# Patient Record
Sex: Female | Born: 1935 | Race: White | Hispanic: No | Smoking: Never smoker
Health system: Southern US, Community
[De-identification: ages and names within clinical notes are randomized; demographics above are authoritative.]

## PROBLEM LIST (undated history)

## (undated) DIAGNOSIS — I639 Cerebral infarction, unspecified: Secondary | ICD-10-CM

## (undated) DIAGNOSIS — K635 Polyp of colon: Secondary | ICD-10-CM

## (undated) DIAGNOSIS — K649 Unspecified hemorrhoids: Secondary | ICD-10-CM

## (undated) DIAGNOSIS — I272 Pulmonary hypertension, unspecified: Secondary | ICD-10-CM

## (undated) DIAGNOSIS — C801 Malignant (primary) neoplasm, unspecified: Secondary | ICD-10-CM

## (undated) DIAGNOSIS — I2510 Atherosclerotic heart disease of native coronary artery without angina pectoris: Secondary | ICD-10-CM

## (undated) DIAGNOSIS — L900 Lichen sclerosus et atrophicus: Secondary | ICD-10-CM

## (undated) DIAGNOSIS — I10 Essential (primary) hypertension: Secondary | ICD-10-CM

## (undated) DIAGNOSIS — R600 Localized edema: Secondary | ICD-10-CM

## (undated) DIAGNOSIS — I4819 Other persistent atrial fibrillation: Secondary | ICD-10-CM

## (undated) DIAGNOSIS — M1990 Unspecified osteoarthritis, unspecified site: Secondary | ICD-10-CM

## (undated) HISTORY — DX: Other persistent atrial fibrillation: I48.19

## (undated) HISTORY — DX: Malignant (primary) neoplasm, unspecified: C80.1

## (undated) HISTORY — DX: Cerebral infarction, unspecified: I63.9

## (undated) HISTORY — DX: Hemochromatosis, unspecified: E83.119

## (undated) HISTORY — DX: Pulmonary hypertension, unspecified: I27.20

## (undated) HISTORY — DX: Unspecified hemorrhoids: K64.9

## (undated) HISTORY — PX: CATARACT EXTRACTION, BILATERAL: SHX1313

## (undated) HISTORY — PX: BREAST LUMPECTOMY: SHX2

## (undated) HISTORY — DX: Essential (primary) hypertension: I10

## (undated) HISTORY — DX: Polyp of colon: K63.5

## (undated) HISTORY — DX: Hereditary hemochromatosis: E83.110

## (undated) HISTORY — DX: Atherosclerotic heart disease of native coronary artery without angina pectoris: I25.10

## (undated) HISTORY — DX: Unspecified osteoarthritis, unspecified site: M19.90

## (undated) HISTORY — DX: Lichen sclerosus et atrophicus: L90.0

## (undated) HISTORY — DX: Localized edema: R60.0

---

## 1998-11-08 ENCOUNTER — Other Ambulatory Visit: Admission: RE | Admit: 1998-11-08 | Discharge: 1998-11-08 | Payer: Self-pay | Admitting: Obstetrics and Gynecology

## 1999-05-13 ENCOUNTER — Other Ambulatory Visit: Admission: RE | Admit: 1999-05-13 | Discharge: 1999-05-13 | Payer: Self-pay | Admitting: Gynecology

## 2000-05-03 ENCOUNTER — Other Ambulatory Visit: Admission: RE | Admit: 2000-05-03 | Discharge: 2000-05-03 | Payer: Self-pay | Admitting: Gynecology

## 2001-07-12 ENCOUNTER — Other Ambulatory Visit: Admission: RE | Admit: 2001-07-12 | Discharge: 2001-07-12 | Payer: Self-pay | Admitting: Gynecology

## 2002-03-11 ENCOUNTER — Other Ambulatory Visit: Admission: RE | Admit: 2002-03-11 | Discharge: 2002-03-11 | Payer: Self-pay | Admitting: Radiology

## 2002-03-31 ENCOUNTER — Encounter: Admission: RE | Admit: 2002-03-31 | Discharge: 2002-03-31 | Payer: Self-pay | Admitting: General Surgery

## 2002-03-31 ENCOUNTER — Encounter: Payer: Self-pay | Admitting: General Surgery

## 2002-04-02 ENCOUNTER — Encounter (INDEPENDENT_AMBULATORY_CARE_PROVIDER_SITE_OTHER): Payer: Self-pay | Admitting: *Deleted

## 2002-04-02 ENCOUNTER — Ambulatory Visit (HOSPITAL_BASED_OUTPATIENT_CLINIC_OR_DEPARTMENT_OTHER): Admission: RE | Admit: 2002-04-02 | Discharge: 2002-04-02 | Payer: Self-pay | Admitting: General Surgery

## 2002-04-02 ENCOUNTER — Encounter: Payer: Self-pay | Admitting: General Surgery

## 2002-04-17 ENCOUNTER — Ambulatory Visit: Admission: RE | Admit: 2002-04-17 | Discharge: 2002-06-27 | Payer: Self-pay | Admitting: Radiation Oncology

## 2002-07-28 ENCOUNTER — Ambulatory Visit: Admission: RE | Admit: 2002-07-28 | Discharge: 2002-07-28 | Payer: Self-pay | Admitting: Radiation Oncology

## 2002-09-01 ENCOUNTER — Other Ambulatory Visit: Admission: RE | Admit: 2002-09-01 | Discharge: 2002-09-01 | Payer: Self-pay | Admitting: Gynecology

## 2003-07-11 HISTORY — PX: DILATION AND CURETTAGE OF UTERUS: SHX78

## 2003-08-11 HISTORY — PX: HYSTEROSCOPY: SHX211

## 2003-08-14 ENCOUNTER — Ambulatory Visit (HOSPITAL_COMMUNITY): Admission: RE | Admit: 2003-08-14 | Discharge: 2003-08-14 | Payer: Self-pay | Admitting: Gastroenterology

## 2003-08-14 ENCOUNTER — Encounter (INDEPENDENT_AMBULATORY_CARE_PROVIDER_SITE_OTHER): Payer: Self-pay | Admitting: *Deleted

## 2003-09-02 ENCOUNTER — Encounter: Admission: RE | Admit: 2003-09-02 | Discharge: 2003-09-02 | Payer: Self-pay | Admitting: Gynecology

## 2003-09-07 ENCOUNTER — Encounter (INDEPENDENT_AMBULATORY_CARE_PROVIDER_SITE_OTHER): Payer: Self-pay | Admitting: Specialist

## 2003-09-07 ENCOUNTER — Ambulatory Visit (HOSPITAL_BASED_OUTPATIENT_CLINIC_OR_DEPARTMENT_OTHER): Admission: RE | Admit: 2003-09-07 | Discharge: 2003-09-07 | Payer: Self-pay | Admitting: Gynecology

## 2003-09-07 ENCOUNTER — Ambulatory Visit (HOSPITAL_COMMUNITY): Admission: RE | Admit: 2003-09-07 | Discharge: 2003-09-07 | Payer: Self-pay | Admitting: Gynecology

## 2004-04-25 ENCOUNTER — Encounter: Admission: RE | Admit: 2004-04-25 | Discharge: 2004-04-25 | Payer: Self-pay | Admitting: Family Medicine

## 2004-05-21 ENCOUNTER — Ambulatory Visit: Payer: Self-pay | Admitting: Oncology

## 2004-07-10 HISTORY — PX: TOTAL KNEE ARTHROPLASTY: SHX125

## 2004-09-29 ENCOUNTER — Other Ambulatory Visit: Admission: RE | Admit: 2004-09-29 | Discharge: 2004-09-29 | Payer: Self-pay | Admitting: Gynecology

## 2004-10-28 ENCOUNTER — Ambulatory Visit: Payer: Self-pay | Admitting: Oncology

## 2004-11-28 ENCOUNTER — Ambulatory Visit: Payer: Self-pay | Admitting: Oncology

## 2005-03-06 ENCOUNTER — Inpatient Hospital Stay (HOSPITAL_COMMUNITY): Admission: RE | Admit: 2005-03-06 | Discharge: 2005-03-10 | Payer: Self-pay | Admitting: Orthopedic Surgery

## 2005-04-03 ENCOUNTER — Ambulatory Visit: Payer: Self-pay | Admitting: Oncology

## 2005-07-21 ENCOUNTER — Ambulatory Visit: Payer: Self-pay | Admitting: Oncology

## 2005-09-14 ENCOUNTER — Ambulatory Visit: Payer: Self-pay | Admitting: Oncology

## 2005-09-19 ENCOUNTER — Inpatient Hospital Stay (HOSPITAL_COMMUNITY): Admission: RE | Admit: 2005-09-19 | Discharge: 2005-09-23 | Payer: Self-pay | Admitting: Orthopedic Surgery

## 2005-11-09 ENCOUNTER — Ambulatory Visit: Payer: Self-pay | Admitting: Oncology

## 2005-12-08 LAB — CBC WITH DIFFERENTIAL (CANCER CENTER ONLY)
BASO%: 0.7 % (ref 0.0–2.0)
Eosinophils Absolute: 0.4 10*3/uL (ref 0.0–0.5)
MCH: 31.7 pg (ref 26.0–34.0)
MONO%: 10.1 % (ref 0.0–13.0)
NEUT#: 3.8 10*3/uL (ref 1.5–6.5)
Platelets: 262 10*3/uL (ref 145–400)
RBC: 3.69 10*6/uL — ABNORMAL LOW (ref 3.70–5.32)
RDW: 10.6 % (ref 10.5–14.6)
WBC: 8 10*3/uL (ref 3.9–10.0)

## 2006-01-01 ENCOUNTER — Ambulatory Visit: Payer: Self-pay | Admitting: Oncology

## 2006-01-02 LAB — COMPREHENSIVE METABOLIC PANEL
CO2: 28 mEq/L (ref 19–32)
Calcium: 9 mg/dL (ref 8.4–10.5)
Chloride: 104 mEq/L (ref 96–112)
Creatinine, Ser: 1.5 mg/dL — ABNORMAL HIGH (ref 0.40–1.20)
Glucose, Bld: 88 mg/dL (ref 70–99)
Total Bilirubin: 0.6 mg/dL (ref 0.3–1.2)

## 2006-01-02 LAB — CBC WITH DIFFERENTIAL (CANCER CENTER ONLY)
BASO#: 0.1 10*3/uL (ref 0.0–0.2)
BASO%: 0.8 % (ref 0.0–2.0)
Eosinophils Absolute: 0.6 10*3/uL — ABNORMAL HIGH (ref 0.0–0.5)
HCT: 34.8 % (ref 34.8–46.6)
HGB: 11.5 g/dL — ABNORMAL LOW (ref 11.6–15.9)
LYMPH#: 3.5 10*3/uL — ABNORMAL HIGH (ref 0.9–3.3)
LYMPH%: 36 % (ref 14.0–48.0)
MCV: 97 fL (ref 81–101)
MONO#: 0.8 10*3/uL (ref 0.1–0.9)
NEUT%: 49.8 % (ref 39.6–80.0)
RBC: 3.59 10*6/uL — ABNORMAL LOW (ref 3.70–5.32)
WBC: 9.9 10*3/uL (ref 3.9–10.0)

## 2006-01-02 LAB — LACTATE DEHYDROGENASE: LDH: 200 U/L (ref 94–250)

## 2006-03-02 ENCOUNTER — Other Ambulatory Visit: Admission: RE | Admit: 2006-03-02 | Discharge: 2006-03-02 | Payer: Self-pay | Admitting: Gynecology

## 2006-03-16 ENCOUNTER — Ambulatory Visit: Payer: Self-pay | Admitting: Family Medicine

## 2006-04-16 ENCOUNTER — Ambulatory Visit: Payer: Self-pay | Admitting: Family Medicine

## 2006-05-18 ENCOUNTER — Ambulatory Visit: Payer: Self-pay | Admitting: Family Medicine

## 2006-06-15 ENCOUNTER — Ambulatory Visit: Payer: Self-pay | Admitting: Family Medicine

## 2006-06-21 ENCOUNTER — Encounter: Admission: RE | Admit: 2006-06-21 | Discharge: 2006-06-21 | Payer: Self-pay | Admitting: Family Medicine

## 2006-07-10 HISTORY — PX: BACK SURGERY: SHX140

## 2006-07-11 ENCOUNTER — Ambulatory Visit: Payer: Self-pay | Admitting: Oncology

## 2006-07-12 LAB — CBC WITH DIFFERENTIAL (CANCER CENTER ONLY)
BASO%: 0.7 % (ref 0.0–2.0)
Eosinophils Absolute: 0.2 10*3/uL (ref 0.0–0.5)
LYMPH#: 3.3 10*3/uL (ref 0.9–3.3)
LYMPH%: 30.8 % (ref 14.0–48.0)
MCV: 98 fL (ref 81–101)
MONO#: 0.9 10*3/uL (ref 0.1–0.9)
NEUT#: 6.4 10*3/uL (ref 1.5–6.5)
Platelets: 301 10*3/uL (ref 145–400)
RBC: 3.87 10*6/uL (ref 3.70–5.32)
RDW: 12 % (ref 10.5–14.6)
WBC: 10.8 10*3/uL — ABNORMAL HIGH (ref 3.9–10.0)

## 2006-07-12 LAB — COMPREHENSIVE METABOLIC PANEL
ALT: 16 U/L (ref 0–35)
AST: 17 U/L (ref 0–37)
CO2: 28 mEq/L (ref 19–32)
Calcium: 9.3 mg/dL (ref 8.4–10.5)
Chloride: 104 mEq/L (ref 96–112)
Creatinine, Ser: 1.04 mg/dL (ref 0.40–1.20)
Sodium: 141 mEq/L (ref 135–145)
Total Bilirubin: 0.4 mg/dL (ref 0.3–1.2)
Total Protein: 6.5 g/dL (ref 6.0–8.3)

## 2006-07-12 LAB — LACTATE DEHYDROGENASE: LDH: 210 U/L (ref 94–250)

## 2006-07-13 ENCOUNTER — Ambulatory Visit: Payer: Self-pay | Admitting: Family Medicine

## 2006-08-10 ENCOUNTER — Ambulatory Visit: Payer: Self-pay | Admitting: Family Medicine

## 2006-09-07 ENCOUNTER — Ambulatory Visit: Payer: Self-pay | Admitting: Family Medicine

## 2006-10-05 ENCOUNTER — Ambulatory Visit: Payer: Self-pay | Admitting: Family Medicine

## 2006-10-05 ENCOUNTER — Encounter: Admission: RE | Admit: 2006-10-05 | Discharge: 2006-10-05 | Payer: Self-pay | Admitting: Family Medicine

## 2006-11-05 ENCOUNTER — Ambulatory Visit: Payer: Self-pay | Admitting: Family Medicine

## 2006-12-04 ENCOUNTER — Ambulatory Visit: Payer: Self-pay | Admitting: Family Medicine

## 2006-12-28 ENCOUNTER — Ambulatory Visit: Payer: Self-pay | Admitting: Family Medicine

## 2007-01-04 ENCOUNTER — Ambulatory Visit: Payer: Self-pay | Admitting: Oncology

## 2007-01-04 ENCOUNTER — Ambulatory Visit: Payer: Self-pay | Admitting: Family Medicine

## 2007-01-08 LAB — CBC WITH DIFFERENTIAL (CANCER CENTER ONLY)
BASO#: 0.1 10*3/uL (ref 0.0–0.2)
BASO%: 0.8 % (ref 0.0–2.0)
EOS%: 2.4 % (ref 0.0–7.0)
HCT: 33.6 % — ABNORMAL LOW (ref 34.8–46.6)
HGB: 11.4 g/dL — ABNORMAL LOW (ref 11.6–15.9)
MCH: 33.3 pg (ref 26.0–34.0)
MCHC: 33.9 g/dL (ref 32.0–36.0)
MONO%: 8.6 % (ref 0.0–13.0)
NEUT#: 5.1 10*3/uL (ref 1.5–6.5)
NEUT%: 54.3 % (ref 39.6–80.0)
RDW: 11.9 % (ref 10.5–14.6)

## 2007-01-08 LAB — COMPREHENSIVE METABOLIC PANEL
AST: 23 U/L (ref 0–37)
Albumin: 3.3 g/dL — ABNORMAL LOW (ref 3.5–5.2)
BUN: 30 mg/dL — ABNORMAL HIGH (ref 6–23)
Calcium: 9.2 mg/dL (ref 8.4–10.5)
Chloride: 108 mEq/L (ref 96–112)
Creatinine, Ser: 1.16 mg/dL (ref 0.40–1.20)
Glucose, Bld: 107 mg/dL — ABNORMAL HIGH (ref 70–99)
Potassium: 5.6 mEq/L — ABNORMAL HIGH (ref 3.5–5.3)

## 2007-01-08 LAB — CANCER ANTIGEN 27.29: CA 27.29: 21 U/mL (ref 0–39)

## 2007-01-09 LAB — FOLATE: Folate: 8 ng/mL

## 2007-01-09 LAB — RETICULOCYTES (CHCC)
ABS Retic: 63.8 10*3/uL (ref 19.0–186.0)
RBC.: 3.75 MIL/uL — ABNORMAL LOW (ref 3.87–5.11)
Retic Ct Pct: 1.7 % (ref 0.4–3.1)

## 2007-01-09 LAB — VITAMIN B12: Vitamin B-12: 304 pg/mL (ref 211–911)

## 2007-01-31 LAB — CBC WITH DIFFERENTIAL (CANCER CENTER ONLY)
BASO%: 0.7 % (ref 0.0–2.0)
EOS%: 5.6 % (ref 0.0–7.0)
LYMPH#: 3.5 10*3/uL — ABNORMAL HIGH (ref 0.9–3.3)
MCH: 33.3 pg (ref 26.0–34.0)
MCHC: 34.1 g/dL (ref 32.0–36.0)
MONO%: 8.4 % (ref 0.0–13.0)
NEUT#: 4.6 10*3/uL (ref 1.5–6.5)
Platelets: 300 10*3/uL (ref 145–400)
RDW: 11.2 % (ref 10.5–14.6)

## 2007-02-04 ENCOUNTER — Observation Stay (HOSPITAL_COMMUNITY): Admission: RE | Admit: 2007-02-04 | Discharge: 2007-02-05 | Payer: Self-pay | Admitting: Specialist

## 2007-02-04 LAB — ERYTHROPOIETIN: Erythropoietin: 14.2 m[IU]/mL (ref 2.6–34.0)

## 2007-02-04 LAB — PROTEIN ELECTROPHORESIS, SERUM
Albumin ELP: 61.1 % (ref 55.8–66.1)
Alpha-1-Globulin: 4.8 % (ref 2.9–4.9)
Beta 2: 4.3 % (ref 3.2–6.5)
Total Protein, Serum Electrophoresis: 6 g/dL (ref 6.0–8.3)

## 2007-02-19 ENCOUNTER — Ambulatory Visit: Payer: Self-pay | Admitting: Family Medicine

## 2007-03-22 ENCOUNTER — Other Ambulatory Visit: Admission: RE | Admit: 2007-03-22 | Discharge: 2007-03-22 | Payer: Self-pay | Admitting: Gynecology

## 2007-04-01 ENCOUNTER — Ambulatory Visit: Payer: Self-pay | Admitting: Oncology

## 2007-04-04 LAB — CBC WITH DIFFERENTIAL (CANCER CENTER ONLY)
BASO#: 0.1 10*3/uL (ref 0.0–0.2)
BASO%: 0.6 % (ref 0.0–2.0)
EOS%: 7.9 % — ABNORMAL HIGH (ref 0.0–7.0)
HCT: 35.3 % (ref 34.8–46.6)
HGB: 12 g/dL (ref 11.6–15.9)
LYMPH#: 3.8 10*3/uL — ABNORMAL HIGH (ref 0.9–3.3)
LYMPH%: 40.2 % (ref 14.0–48.0)
MCHC: 34.1 g/dL (ref 32.0–36.0)
MCV: 96 fL (ref 81–101)
NEUT%: 43.8 % (ref 39.6–80.0)
RDW: 10.3 % — ABNORMAL LOW (ref 10.5–14.6)

## 2007-04-05 LAB — IRON AND TIBC: Iron: 149 ug/dL — ABNORMAL HIGH (ref 42–145)

## 2007-04-05 LAB — RETICULOCYTES (CHCC)
ABS Retic: 29.8 10*3/uL (ref 19.0–186.0)
RBC.: 3.73 MIL/uL — ABNORMAL LOW (ref 3.87–5.11)
Retic Ct Pct: 0.8 % (ref 0.4–3.1)

## 2007-05-09 LAB — HEMOCHROMATOSIS DNA-PCR(C282Y,H63D)

## 2007-05-14 LAB — CBC WITH DIFFERENTIAL (CANCER CENTER ONLY)
Eosinophils Absolute: 0.3 10*3/uL (ref 0.0–0.5)
HGB: 11.3 g/dL — ABNORMAL LOW (ref 11.6–15.9)
LYMPH%: 39.5 % (ref 14.0–48.0)
MCV: 94 fL (ref 81–101)
MONO#: 0.8 10*3/uL (ref 0.1–0.9)
Platelets: 310 10*3/uL (ref 145–400)
RBC: 3.48 10*6/uL — ABNORMAL LOW (ref 3.70–5.32)
WBC: 10.2 10*3/uL — ABNORMAL HIGH (ref 3.9–10.0)

## 2007-05-22 ENCOUNTER — Ambulatory Visit: Payer: Self-pay | Admitting: Oncology

## 2007-05-24 ENCOUNTER — Encounter: Payer: Self-pay | Admitting: Oncology

## 2007-05-24 ENCOUNTER — Other Ambulatory Visit: Admission: RE | Admit: 2007-05-24 | Discharge: 2007-05-24 | Payer: Self-pay | Admitting: Oncology

## 2007-05-24 LAB — CBC WITH DIFFERENTIAL/PLATELET
Basophils Absolute: 0.1 10*3/uL (ref 0.0–0.1)
EOS%: 3.2 % (ref 0.0–7.0)
Eosinophils Absolute: 0.3 10*3/uL (ref 0.0–0.5)
HGB: 11.4 g/dL — ABNORMAL LOW (ref 11.6–15.9)
NEUT#: 4.7 10*3/uL (ref 1.5–6.5)
RBC: 3.42 10*6/uL — ABNORMAL LOW (ref 3.70–5.32)
RDW: 13.3 % (ref 11.3–14.5)
WBC: 9.9 10*3/uL (ref 3.9–10.0)
lymph#: 3.8 10*3/uL — ABNORMAL HIGH (ref 0.9–3.3)

## 2007-06-14 ENCOUNTER — Ambulatory Visit: Payer: Self-pay | Admitting: Oncology

## 2007-06-17 LAB — CBC WITH DIFFERENTIAL (CANCER CENTER ONLY)
Eosinophils Absolute: 0.3 10*3/uL (ref 0.0–0.5)
HCT: 32.5 % — ABNORMAL LOW (ref 34.8–46.6)
LYMPH%: 40.2 % (ref 14.0–48.0)
MCV: 96 fL (ref 81–101)
MONO#: 0.8 10*3/uL (ref 0.1–0.9)
NEUT%: 48.3 % (ref 39.6–80.0)
RBC: 3.4 10*6/uL — ABNORMAL LOW (ref 3.70–5.32)
RDW: 12.1 % (ref 10.5–14.6)
WBC: 9.8 10*3/uL (ref 3.9–10.0)

## 2007-07-11 HISTORY — PX: COLONOSCOPY: SHX174

## 2007-07-18 LAB — COMPREHENSIVE METABOLIC PANEL
AST: 24 U/L (ref 0–37)
Albumin: 3.8 g/dL (ref 3.5–5.2)
Alkaline Phosphatase: 88 U/L (ref 39–117)
BUN: 31 mg/dL — ABNORMAL HIGH (ref 6–23)
Calcium: 9 mg/dL (ref 8.4–10.5)
Chloride: 103 mEq/L (ref 96–112)
Glucose, Bld: 106 mg/dL — ABNORMAL HIGH (ref 70–99)
Potassium: 4.6 mEq/L (ref 3.5–5.3)
Sodium: 134 mEq/L — ABNORMAL LOW (ref 135–145)
Total Protein: 6.1 g/dL (ref 6.0–8.3)

## 2007-07-18 LAB — CANCER ANTIGEN 27.29: CA 27.29: 24 U/mL (ref 0–39)

## 2007-07-18 LAB — CBC WITH DIFFERENTIAL (CANCER CENTER ONLY)
Eosinophils Absolute: 0.2 10*3/uL (ref 0.0–0.5)
LYMPH#: 3.9 10*3/uL — ABNORMAL HIGH (ref 0.9–3.3)
MCH: 33.1 pg (ref 26.0–34.0)
MONO#: 0.9 10*3/uL (ref 0.1–0.9)
MONO%: 8.3 % (ref 0.0–13.0)
NEUT#: 5.6 10*3/uL (ref 1.5–6.5)
Platelets: 336 10*3/uL (ref 145–400)
RBC: 3.27 10*6/uL — ABNORMAL LOW (ref 3.70–5.32)
WBC: 10.6 10*3/uL — ABNORMAL HIGH (ref 3.9–10.0)

## 2007-08-16 ENCOUNTER — Ambulatory Visit: Payer: Self-pay | Admitting: Oncology

## 2007-08-19 LAB — CBC WITH DIFFERENTIAL (CANCER CENTER ONLY)
BASO%: 0.6 % (ref 0.0–2.0)
EOS%: 3.1 % (ref 0.0–7.0)
HCT: 34.1 % — ABNORMAL LOW (ref 34.8–46.6)
LYMPH#: 3.6 10*3/uL — ABNORMAL HIGH (ref 0.9–3.3)
LYMPH%: 39.7 % (ref 14.0–48.0)
MCHC: 33.8 g/dL (ref 32.0–36.0)
MONO#: 0.8 10*3/uL (ref 0.1–0.9)
NEUT%: 48.3 % (ref 39.6–80.0)
Platelets: 304 10*3/uL (ref 145–400)
RDW: 10.5 % (ref 10.5–14.6)
WBC: 9.1 10*3/uL (ref 3.9–10.0)

## 2007-08-19 LAB — IRON AND TIBC

## 2007-09-19 LAB — FERRITIN: Ferritin: 794 ng/mL — ABNORMAL HIGH (ref 10–291)

## 2007-09-19 LAB — CBC WITH DIFFERENTIAL (CANCER CENTER ONLY)
BASO#: 0.1 10*3/uL (ref 0.0–0.2)
Eosinophils Absolute: 0.3 10*3/uL (ref 0.0–0.5)
HCT: 32.9 % — ABNORMAL LOW (ref 34.8–46.6)
LYMPH%: 35.2 % (ref 14.0–48.0)
MCH: 32.6 pg (ref 26.0–34.0)
MCV: 97 fL (ref 81–101)
MONO#: 0.8 10*3/uL (ref 0.1–0.9)
MONO%: 8.8 % (ref 0.0–13.0)
NEUT%: 52.4 % (ref 39.6–80.0)
Platelets: 305 10*3/uL (ref 145–400)
RBC: 3.41 10*6/uL — ABNORMAL LOW (ref 3.70–5.32)
WBC: 9.3 10*3/uL (ref 3.9–10.0)

## 2007-09-19 LAB — IRON AND TIBC
Iron: 166 ug/dL — ABNORMAL HIGH (ref 42–145)
UIBC: <55 ug/dL

## 2007-10-24 ENCOUNTER — Ambulatory Visit: Payer: Self-pay | Admitting: Oncology

## 2007-10-25 LAB — CBC WITH DIFFERENTIAL (CANCER CENTER ONLY)
BASO%: 0.7 % (ref 0.0–2.0)
HCT: 32.7 % — ABNORMAL LOW (ref 34.8–46.6)
LYMPH%: 31.9 % (ref 14.0–48.0)
MCH: 32.4 pg (ref 26.0–34.0)
MCHC: 34.1 g/dL (ref 32.0–36.0)
MCV: 95 fL (ref 81–101)
MONO#: 1.1 10*3/uL — ABNORMAL HIGH (ref 0.1–0.9)
MONO%: 8.6 % (ref 0.0–13.0)
NEUT%: 55.9 % (ref 39.6–80.0)
Platelets: 305 10*3/uL (ref 145–400)
RDW: 11.3 % (ref 10.5–14.6)

## 2007-11-18 LAB — CBC WITH DIFFERENTIAL (CANCER CENTER ONLY)
BASO%: 0.9 % (ref 0.0–2.0)
HCT: 33.2 % — ABNORMAL LOW (ref 34.8–46.6)
LYMPH%: 30.1 % (ref 14.0–48.0)
MCHC: 33.7 g/dL (ref 32.0–36.0)
MCV: 95 fL (ref 81–101)
MONO#: 1 10*3/uL — ABNORMAL HIGH (ref 0.1–0.9)
NEUT%: 51.5 % (ref 39.6–80.0)
RDW: 10.7 % (ref 10.5–14.6)
WBC: 9.4 10*3/uL (ref 3.9–10.0)

## 2007-11-19 LAB — IRON AND TIBC
%SAT: 41 % (ref 20–55)
Iron: 74 ug/dL (ref 42–145)
TIBC: 179 ug/dL — ABNORMAL LOW (ref 250–470)

## 2007-12-18 ENCOUNTER — Ambulatory Visit: Payer: Self-pay | Admitting: Oncology

## 2007-12-20 LAB — CBC WITH DIFFERENTIAL (CANCER CENTER ONLY)
BASO%: 0.6 % (ref 0.0–2.0)
EOS%: 3.7 % (ref 0.0–7.0)
HCT: 33.7 % — ABNORMAL LOW (ref 34.8–46.6)
LYMPH#: 3 10*3/uL (ref 0.9–3.3)
LYMPH%: 32.1 % (ref 14.0–48.0)
MCHC: 34.9 g/dL (ref 32.0–36.0)
MCV: 94 fL (ref 81–101)
MONO%: 7.5 % (ref 0.0–13.0)
NEUT%: 56.1 % (ref 39.6–80.0)
RDW: 10.7 % (ref 10.5–14.6)

## 2007-12-20 LAB — FERRITIN: Ferritin: 934 ng/mL — ABNORMAL HIGH (ref 10–291)

## 2008-01-16 LAB — IRON AND TIBC
%SAT: 67 % — ABNORMAL HIGH (ref 20–55)
UIBC: 64 ug/dL

## 2008-01-16 LAB — COMPREHENSIVE METABOLIC PANEL
ALT: 17 U/L (ref 0–35)
Alkaline Phosphatase: 90 U/L (ref 39–117)
Creatinine, Ser: 1.11 mg/dL (ref 0.40–1.20)
Glucose, Bld: 146 mg/dL — ABNORMAL HIGH (ref 70–99)
Sodium: 137 mEq/L (ref 135–145)
Total Bilirubin: 0.8 mg/dL (ref 0.3–1.2)
Total Protein: 5.9 g/dL — ABNORMAL LOW (ref 6.0–8.3)

## 2008-01-16 LAB — CBC WITH DIFFERENTIAL (CANCER CENTER ONLY)
BASO#: 0.1 10*3/uL (ref 0.0–0.2)
EOS%: 2.2 % (ref 0.0–7.0)
Eosinophils Absolute: 0.2 10*3/uL (ref 0.0–0.5)
HGB: 11.2 g/dL — ABNORMAL LOW (ref 11.6–15.9)
LYMPH#: 3.2 10*3/uL (ref 0.9–3.3)
NEUT#: 5.6 10*3/uL (ref 1.5–6.5)
Platelets: 315 10*3/uL (ref 145–400)
RBC: 3.5 10*6/uL — ABNORMAL LOW (ref 3.70–5.32)

## 2008-01-16 LAB — FERRITIN: Ferritin: 746 ng/mL — ABNORMAL HIGH (ref 10–291)

## 2008-02-18 ENCOUNTER — Ambulatory Visit: Payer: Self-pay | Admitting: Oncology

## 2008-02-24 LAB — CBC WITH DIFFERENTIAL (CANCER CENTER ONLY)
BASO#: 0.1 10*3/uL (ref 0.0–0.2)
Eosinophils Absolute: 0.3 10*3/uL (ref 0.0–0.5)
HGB: 11.5 g/dL — ABNORMAL LOW (ref 11.6–15.9)
MCH: 33.2 pg (ref 26.0–34.0)
MCV: 95 fL (ref 81–101)
MONO#: 0.9 10*3/uL (ref 0.1–0.9)
MONO%: 8.2 % (ref 0.0–13.0)
NEUT#: 6.3 10*3/uL (ref 1.5–6.5)
RBC: 3.48 10*6/uL — ABNORMAL LOW (ref 3.70–5.32)
WBC: 11.5 10*3/uL — ABNORMAL HIGH (ref 3.9–10.0)

## 2008-02-24 LAB — FERRITIN: Ferritin: 669 ng/mL — ABNORMAL HIGH (ref 10–291)

## 2008-02-25 LAB — IRON AND TIBC

## 2008-03-12 ENCOUNTER — Ambulatory Visit: Payer: Self-pay | Admitting: Gynecology

## 2008-03-31 ENCOUNTER — Ambulatory Visit: Payer: Self-pay | Admitting: Oncology

## 2008-04-03 LAB — CBC WITH DIFFERENTIAL (CANCER CENTER ONLY)
Eosinophils Absolute: 0.1 10*3/uL (ref 0.0–0.5)
HCT: 35.2 % (ref 34.8–46.6)
HGB: 12.1 g/dL (ref 11.6–15.9)
LYMPH%: 33.9 % (ref 14.0–48.0)
MCV: 96 fL (ref 81–101)
MONO#: 0.8 10*3/uL (ref 0.1–0.9)
NEUT%: 56.2 % (ref 39.6–80.0)
RBC: 3.66 10*6/uL — ABNORMAL LOW (ref 3.70–5.32)
WBC: 9.4 10*3/uL (ref 3.9–10.0)

## 2008-05-04 LAB — CBC WITH DIFFERENTIAL (CANCER CENTER ONLY)
BASO%: 1 % (ref 0.0–2.0)
EOS%: 2.1 % (ref 0.0–7.0)
LYMPH#: 3.6 10*3/uL — ABNORMAL HIGH (ref 0.9–3.3)
MCHC: 34.2 g/dL (ref 32.0–36.0)
NEUT#: 5.1 10*3/uL (ref 1.5–6.5)
Platelets: 329 10*3/uL (ref 145–400)
RDW: 11 % (ref 10.5–14.6)

## 2008-05-04 LAB — CMP (CANCER CENTER ONLY)
Albumin: 3.7 g/dL (ref 3.3–5.5)
Alkaline Phosphatase: 94 U/L — ABNORMAL HIGH (ref 26–84)
BUN, Bld: 26 mg/dL — ABNORMAL HIGH (ref 7–22)
Creat: 1.3 mg/dl — ABNORMAL HIGH (ref 0.6–1.2)
Glucose, Bld: 117 mg/dL (ref 73–118)
Potassium: 4.5 mEq/L (ref 3.3–4.7)
Total Bilirubin: 0.6 mg/dl (ref 0.20–1.60)

## 2008-05-05 LAB — IRON AND TIBC: Iron: 164 ug/dL — ABNORMAL HIGH (ref 42–145)

## 2008-05-05 LAB — FERRITIN: Ferritin: 709 ng/mL — ABNORMAL HIGH (ref 10–291)

## 2008-06-01 ENCOUNTER — Ambulatory Visit: Payer: Self-pay | Admitting: Oncology

## 2008-06-05 LAB — CBC WITH DIFFERENTIAL (CANCER CENTER ONLY)
Eosinophils Absolute: 0.3 10*3/uL (ref 0.0–0.5)
MONO#: 1 10*3/uL — ABNORMAL HIGH (ref 0.1–0.9)
NEUT#: 4.2 10*3/uL (ref 1.5–6.5)
Platelets: 313 10*3/uL (ref 145–400)
RBC: 3.33 10*6/uL — ABNORMAL LOW (ref 3.70–5.32)
WBC: 9.6 10*3/uL (ref 3.9–10.0)

## 2008-06-05 LAB — IRON AND TIBC
Iron: 166 ug/dL — ABNORMAL HIGH (ref 42–145)
UIBC: <55 ug/dL

## 2008-07-02 LAB — CBC WITH DIFFERENTIAL (CANCER CENTER ONLY)
EOS%: 3.4 % (ref 0.0–7.0)
Eosinophils Absolute: 0.3 10*3/uL (ref 0.0–0.5)
LYMPH#: 3 10*3/uL (ref 0.9–3.3)
MCH: 32.9 pg (ref 26.0–34.0)
MCHC: 33.2 g/dL (ref 32.0–36.0)
MONO%: 9.3 % (ref 0.0–13.0)
NEUT#: 4.5 10*3/uL (ref 1.5–6.5)
Platelets: 278 10*3/uL (ref 145–400)
RBC: 3.36 10*6/uL — ABNORMAL LOW (ref 3.70–5.32)

## 2008-07-02 LAB — CMP (CANCER CENTER ONLY)
ALT(SGPT): 17 U/L (ref 10–47)
AST: 27 U/L (ref 11–38)
Alkaline Phosphatase: 81 U/L (ref 26–84)
Creat: 1.4 mg/dl — ABNORMAL HIGH (ref 0.6–1.2)
Sodium: 136 mEq/L (ref 128–145)
Total Bilirubin: 0.5 mg/dl (ref 0.20–1.60)
Total Protein: 6.4 g/dL (ref 6.4–8.1)

## 2008-07-02 LAB — IRON AND TIBC

## 2008-07-02 LAB — FERRITIN: Ferritin: 817 ng/mL — ABNORMAL HIGH (ref 10–291)

## 2008-07-30 ENCOUNTER — Ambulatory Visit: Payer: Self-pay | Admitting: Oncology

## 2008-08-06 LAB — BASIC METABOLIC PANEL
CO2: 27 mEq/L (ref 19–32)
Calcium: 8.8 mg/dL (ref 8.4–10.5)
Creatinine, Ser: 1.15 mg/dL (ref 0.40–1.20)
Glucose, Bld: 109 mg/dL — ABNORMAL HIGH (ref 70–99)
Sodium: 136 mEq/L (ref 135–145)

## 2008-09-04 LAB — CBC WITH DIFFERENTIAL (CANCER CENTER ONLY)
BASO#: 0.1 10*3/uL (ref 0.0–0.2)
Eosinophils Absolute: 0.3 10*3/uL (ref 0.0–0.5)
HCT: 35 % (ref 34.8–46.6)
HGB: 11.8 g/dL (ref 11.6–15.9)
LYMPH#: 3.5 10*3/uL — ABNORMAL HIGH (ref 0.9–3.3)
MCH: 32.9 pg (ref 26.0–34.0)
MONO%: 9 % (ref 0.0–13.0)
NEUT#: 4.6 10*3/uL (ref 1.5–6.5)
NEUT%: 49.6 % (ref 39.6–80.0)
RBC: 3.6 10*6/uL — ABNORMAL LOW (ref 3.70–5.32)

## 2008-09-04 LAB — IRON AND TIBC
Iron: 150 ug/dL — ABNORMAL HIGH (ref 42–145)
UIBC: <55 ug/dL

## 2008-10-07 ENCOUNTER — Ambulatory Visit: Payer: Self-pay | Admitting: Oncology

## 2008-10-09 LAB — CBC WITH DIFFERENTIAL (CANCER CENTER ONLY)
BASO%: 0.8 % (ref 0.0–2.0)
EOS%: 4.3 % (ref 0.0–7.0)
HCT: 34.1 % — ABNORMAL LOW (ref 34.8–46.6)
LYMPH#: 3.2 10*3/uL (ref 0.9–3.3)
MCHC: 33.8 g/dL (ref 32.0–36.0)
MONO#: 0.8 10*3/uL (ref 0.1–0.9)
NEUT#: 4.3 10*3/uL (ref 1.5–6.5)
NEUT%: 48.8 % (ref 39.6–80.0)
Platelets: 304 10*3/uL (ref 145–400)
RDW: 11 % (ref 10.5–14.6)
WBC: 8.8 10*3/uL (ref 3.9–10.0)

## 2008-10-09 LAB — CMP (CANCER CENTER ONLY)
AST: 26 U/L (ref 11–38)
Albumin: 3.5 g/dL (ref 3.3–5.5)
Alkaline Phosphatase: 92 U/L — ABNORMAL HIGH (ref 26–84)
Calcium: 8.6 mg/dL (ref 8.0–10.3)
Chloride: 102 mEq/L (ref 98–108)
Glucose, Bld: 160 mg/dL — ABNORMAL HIGH (ref 73–118)
Potassium: 4 mEq/L (ref 3.3–4.7)
Sodium: 139 mEq/L (ref 128–145)
Total Protein: 6.4 g/dL (ref 6.4–8.1)

## 2008-10-12 LAB — IRON AND TIBC: %SAT: 59 % — ABNORMAL HIGH (ref 20–55)

## 2008-11-05 LAB — CBC WITH DIFFERENTIAL (CANCER CENTER ONLY)
BASO#: 0.1 10*3/uL (ref 0.0–0.2)
EOS%: 4.4 % (ref 0.0–7.0)
HCT: 33 % — ABNORMAL LOW (ref 34.8–46.6)
HGB: 11.4 g/dL — ABNORMAL LOW (ref 11.6–15.9)
LYMPH#: 3.2 10*3/uL (ref 0.9–3.3)
MCHC: 34.6 g/dL (ref 32.0–36.0)
MONO#: 0.8 10*3/uL (ref 0.1–0.9)
NEUT#: 4.6 10*3/uL (ref 1.5–6.5)
NEUT%: 50.8 % (ref 39.6–80.0)
RBC: 3.4 10*6/uL — ABNORMAL LOW (ref 3.70–5.32)
WBC: 9.1 10*3/uL (ref 3.9–10.0)

## 2008-11-05 LAB — IRON AND TIBC
TIBC: 184 ug/dL — ABNORMAL LOW (ref 250–470)
UIBC: 63 ug/dL

## 2008-11-23 ENCOUNTER — Ambulatory Visit: Payer: Self-pay | Admitting: Gynecology

## 2008-12-01 ENCOUNTER — Ambulatory Visit: Payer: Self-pay | Admitting: Oncology

## 2008-12-04 LAB — CBC WITH DIFFERENTIAL (CANCER CENTER ONLY)
BASO%: 0.8 % (ref 0.0–2.0)
EOS%: 5.1 % (ref 0.0–7.0)
HCT: 32.1 % — ABNORMAL LOW (ref 34.8–46.6)
LYMPH%: 38.8 % (ref 14.0–48.0)
MCHC: 35.5 g/dL (ref 32.0–36.0)
MCV: 97 fL (ref 81–101)
MONO#: 0.8 10*3/uL (ref 0.1–0.9)
MONO%: 9.3 % (ref 0.0–13.0)
NEUT%: 46 % (ref 39.6–80.0)
Platelets: 290 10*3/uL (ref 145–400)
RDW: 12 % (ref 10.5–14.6)
WBC: 8.4 10*3/uL (ref 3.9–10.0)

## 2008-12-04 LAB — IRON AND TIBC
%SAT: 72 % — ABNORMAL HIGH (ref 20–55)
TIBC: 236 ug/dL — ABNORMAL LOW (ref 250–470)

## 2009-01-19 ENCOUNTER — Ambulatory Visit: Payer: Self-pay | Admitting: Oncology

## 2009-01-21 LAB — CBC WITH DIFFERENTIAL (CANCER CENTER ONLY)
BASO%: 0.7 % (ref 0.0–2.0)
Eosinophils Absolute: 0.3 10*3/uL (ref 0.0–0.5)
HCT: 33.8 % — ABNORMAL LOW (ref 34.8–46.6)
LYMPH%: 43.7 % (ref 14.0–48.0)
MCH: 33.6 pg (ref 26.0–34.0)
MCV: 96 fL (ref 81–101)
MONO%: 9.6 % (ref 0.0–13.0)
NEUT%: 42.3 % (ref 39.6–80.0)
Platelets: 295 10*3/uL (ref 145–400)
RDW: 10.7 % (ref 10.5–14.6)

## 2009-01-22 LAB — FERRITIN: Ferritin: 603 ng/mL — ABNORMAL HIGH (ref 10–291)

## 2009-01-22 LAB — IRON AND TIBC: Iron: 161 ug/dL — ABNORMAL HIGH (ref 42–145)

## 2009-02-17 ENCOUNTER — Ambulatory Visit: Payer: Self-pay | Admitting: Oncology

## 2009-02-22 LAB — CBC WITH DIFFERENTIAL (CANCER CENTER ONLY)
BASO#: 0.1 10*3/uL (ref 0.0–0.2)
Eosinophils Absolute: 0.4 10*3/uL (ref 0.0–0.5)
HCT: 32.7 % — ABNORMAL LOW (ref 34.8–46.6)
HGB: 11.4 g/dL — ABNORMAL LOW (ref 11.6–15.9)
LYMPH%: 40.1 % (ref 14.0–48.0)
MCH: 33.4 pg (ref 26.0–34.0)
MCV: 96 fL (ref 81–101)
MONO#: 0.7 10*3/uL (ref 0.1–0.9)
MONO%: 8.5 % (ref 0.0–13.0)
RBC: 3.41 10*6/uL — ABNORMAL LOW (ref 3.70–5.32)

## 2009-02-23 LAB — IRON AND TIBC
%SAT: 63 % — ABNORMAL HIGH (ref 20–55)
Iron: 117 ug/dL (ref 42–145)

## 2009-03-22 ENCOUNTER — Ambulatory Visit: Payer: Self-pay | Admitting: Oncology

## 2009-03-26 LAB — IRON AND TIBC: Iron: 170 ug/dL — ABNORMAL HIGH (ref 42–145)

## 2009-03-26 LAB — CBC WITH DIFFERENTIAL (CANCER CENTER ONLY)
BASO#: 0.1 10*3/uL (ref 0.0–0.2)
Eosinophils Absolute: 0.4 10*3/uL (ref 0.0–0.5)
HGB: 11.4 g/dL — ABNORMAL LOW (ref 11.6–15.9)
LYMPH#: 2.9 10*3/uL (ref 0.9–3.3)
MCH: 33.8 pg (ref 26.0–34.0)
MONO#: 0.7 10*3/uL (ref 0.1–0.9)
NEUT#: 3.8 10*3/uL (ref 1.5–6.5)
RBC: 3.37 10*6/uL — ABNORMAL LOW (ref 3.70–5.32)
WBC: 7.8 10*3/uL (ref 3.9–10.0)

## 2009-04-21 ENCOUNTER — Ambulatory Visit: Payer: Self-pay | Admitting: Oncology

## 2009-04-23 LAB — CBC WITH DIFFERENTIAL (CANCER CENTER ONLY)
BASO#: 0 10*3/uL (ref 0.0–0.2)
BASO%: 0.6 % (ref 0.0–2.0)
EOS%: 3.9 % (ref 0.0–7.0)
HCT: 33.6 % — ABNORMAL LOW (ref 34.8–46.6)
MCH: 33.9 pg (ref 26.0–34.0)
MCV: 101 fL (ref 81–101)
RDW: 10.7 % (ref 10.5–14.6)
WBC: 6.6 10*3/uL (ref 3.9–10.0)

## 2009-04-23 LAB — FERRITIN: Ferritin: 600 ng/mL — ABNORMAL HIGH (ref 10–291)

## 2009-04-23 LAB — IRON AND TIBC: UIBC: <55 ug/dL

## 2009-05-04 ENCOUNTER — Ambulatory Visit: Payer: Self-pay | Admitting: Family Medicine

## 2009-05-18 ENCOUNTER — Ambulatory Visit: Payer: Self-pay | Admitting: Oncology

## 2009-05-21 LAB — CBC WITH DIFFERENTIAL (CANCER CENTER ONLY)
BASO#: 0.1 10*3/uL (ref 0.0–0.2)
Eosinophils Absolute: 0.3 10*3/uL (ref 0.0–0.5)
HCT: 33.4 % — ABNORMAL LOW (ref 34.8–46.6)
HGB: 11.1 g/dL — ABNORMAL LOW (ref 11.6–15.9)
LYMPH%: 31.2 % (ref 14.0–48.0)
MCH: 33.8 pg (ref 26.0–34.0)
MCV: 102 fL — ABNORMAL HIGH (ref 81–101)
MONO#: 1 10*3/uL — ABNORMAL HIGH (ref 0.1–0.9)
MONO%: 8.6 % (ref 0.0–13.0)
Platelets: 295 10*3/uL (ref 145–400)
RBC: 3.27 10*6/uL — ABNORMAL LOW (ref 3.70–5.32)
WBC: 11.1 10*3/uL — ABNORMAL HIGH (ref 3.9–10.0)

## 2009-05-22 LAB — IRON AND TIBC
TIBC: 190 ug/dL — ABNORMAL LOW (ref 250–470)
UIBC: 67 ug/dL

## 2009-06-01 LAB — HM DEXA SCAN

## 2009-06-15 ENCOUNTER — Ambulatory Visit: Payer: Self-pay | Admitting: Oncology

## 2009-06-18 LAB — CBC WITH DIFFERENTIAL (CANCER CENTER ONLY)
BASO#: 0.1 10*3/uL (ref 0.0–0.2)
Eosinophils Absolute: 0.4 10*3/uL (ref 0.0–0.5)
HGB: 11.2 g/dL — ABNORMAL LOW (ref 11.6–15.9)
LYMPH#: 2.9 10*3/uL (ref 0.9–3.3)
MCH: 33.7 pg (ref 26.0–34.0)
MONO#: 0.8 10*3/uL (ref 0.1–0.9)
MONO%: 9.2 % (ref 0.0–13.0)
NEUT#: 4.2 10*3/uL (ref 1.5–6.5)
Platelets: 327 10*3/uL (ref 145–400)
RBC: 3.31 10*6/uL — ABNORMAL LOW (ref 3.70–5.32)
WBC: 8.3 10*3/uL (ref 3.9–10.0)

## 2009-06-18 LAB — AFP TUMOR MARKER: AFP-Tumor Marker: 2.5 ng/mL (ref 0.0–8.0)

## 2009-06-18 LAB — CMP (CANCER CENTER ONLY)
ALT(SGPT): 19 U/L (ref 10–47)
AST: 24 U/L (ref 11–38)
Alkaline Phosphatase: 92 U/L — ABNORMAL HIGH (ref 26–84)
Sodium: 143 mEq/L (ref 128–145)
Total Bilirubin: 0.7 mg/dl (ref 0.20–1.60)
Total Protein: 6.5 g/dL (ref 6.4–8.1)

## 2009-06-18 LAB — FERRITIN: Ferritin: 600 ng/mL — ABNORMAL HIGH (ref 10–291)

## 2009-06-18 LAB — IRON AND TIBC: %SAT: 55 % (ref 20–55)

## 2009-06-22 ENCOUNTER — Ambulatory Visit (HOSPITAL_COMMUNITY): Admission: RE | Admit: 2009-06-22 | Discharge: 2009-06-22 | Payer: Self-pay | Admitting: Sports Medicine

## 2009-07-28 ENCOUNTER — Ambulatory Visit: Payer: Self-pay | Admitting: Oncology

## 2009-07-30 LAB — CBC WITH DIFFERENTIAL (CANCER CENTER ONLY)
BASO#: 0.1 10*3/uL (ref 0.0–0.2)
BASO%: 0.9 % (ref 0.0–2.0)
EOS%: 4.4 % (ref 0.0–7.0)
Eosinophils Absolute: 0.4 10*3/uL (ref 0.0–0.5)
HCT: 34.5 % — ABNORMAL LOW (ref 34.8–46.6)
HGB: 11.7 g/dL (ref 11.6–15.9)
LYMPH#: 3.7 10*3/uL — ABNORMAL HIGH (ref 0.9–3.3)
LYMPH%: 38.3 % (ref 14.0–48.0)
MCH: 34.5 pg — ABNORMAL HIGH (ref 26.0–34.0)
MCHC: 34 g/dL (ref 32.0–36.0)
MCV: 101 fL (ref 81–101)
MONO#: 0.9 10*3/uL (ref 0.1–0.9)
MONO%: 9.5 % (ref 0.0–13.0)
NEUT#: 4.5 10*3/uL (ref 1.5–6.5)
NEUT%: 46.9 % (ref 39.6–80.0)
Platelets: 275 10*3/uL (ref 145–400)
RBC: 3.4 10*6/uL — ABNORMAL LOW (ref 3.70–5.32)
RDW: 10.6 % (ref 10.5–14.6)
WBC: 9.6 10*3/uL (ref 3.9–10.0)

## 2009-07-30 LAB — IRON AND TIBC
Iron: 180 ug/dL — ABNORMAL HIGH (ref 42–145)
UIBC: <55 ug/dL

## 2009-07-30 LAB — FERRITIN: Ferritin: 512 ng/mL — ABNORMAL HIGH (ref 10–291)

## 2009-08-09 LAB — PROTEIN ELECTROPHORESIS, SERUM
Albumin ELP: 60.4 % (ref 55.8–66.1)
Alpha-1-Globulin: 4.2 % (ref 2.9–4.9)
Alpha-2-Globulin: 13.7 % — ABNORMAL HIGH (ref 7.1–11.8)
Beta 2: 5.2 % (ref 3.2–6.5)
Beta Globulin: 4.4 % — ABNORMAL LOW (ref 4.7–7.2)
Gamma Globulin: 12.1 % (ref 11.1–18.8)
Total Protein, Serum Electrophoresis: 7.2 g/dL (ref 6.0–8.3)

## 2009-08-09 LAB — IGG, IGA, IGM
IgA: 216 mg/dL (ref 68–378)
IgG (Immunoglobin G), Serum: 812 mg/dL (ref 694–1618)
IgM, Serum: 59 mg/dL — ABNORMAL LOW (ref 60–263)

## 2009-08-10 ENCOUNTER — Ambulatory Visit (HOSPITAL_COMMUNITY): Admission: RE | Admit: 2009-08-10 | Discharge: 2009-08-10 | Payer: Self-pay | Admitting: Radiation Oncology

## 2009-08-24 ENCOUNTER — Ambulatory Visit: Payer: Self-pay | Admitting: Oncology

## 2009-08-27 LAB — CBC WITH DIFFERENTIAL (CANCER CENTER ONLY)
BASO#: 0.1 10*3/uL (ref 0.0–0.2)
BASO%: 0.7 % (ref 0.0–2.0)
EOS%: 4.1 % (ref 0.0–7.0)
Eosinophils Absolute: 0.3 10*3/uL (ref 0.0–0.5)
HCT: 35.4 % (ref 34.8–46.6)
HGB: 11.9 g/dL (ref 11.6–15.9)
LYMPH#: 3.2 10*3/uL (ref 0.9–3.3)
LYMPH%: 38.6 % (ref 14.0–48.0)
MCH: 33.6 pg (ref 26.0–34.0)
MCHC: 33.5 g/dL (ref 32.0–36.0)
MCV: 100 fL (ref 81–101)
MONO#: 0.6 10*3/uL (ref 0.1–0.9)
MONO%: 7 % (ref 0.0–13.0)
NEUT#: 4.1 10*3/uL (ref 1.5–6.5)
NEUT%: 49.6 % (ref 39.6–80.0)
Platelets: 294 10*3/uL (ref 145–400)
RBC: 3.53 10*6/uL — ABNORMAL LOW (ref 3.70–5.32)
RDW: 10.7 % (ref 10.5–14.6)
WBC: 8.3 10*3/uL (ref 3.9–10.0)

## 2009-08-27 LAB — CMP (CANCER CENTER ONLY)
ALT(SGPT): 20 U/L (ref 10–47)
AST: 23 U/L (ref 11–38)
Albumin: 3.6 g/dL (ref 3.3–5.5)
Alkaline Phosphatase: 81 U/L (ref 26–84)
BUN, Bld: 32 mg/dL — ABNORMAL HIGH (ref 7–22)
CO2: 29 mEq/L (ref 18–33)
Calcium: 9.2 mg/dL (ref 8.0–10.3)
Chloride: 103 mEq/L (ref 98–108)
Creat: 1.1 mg/dl (ref 0.6–1.2)
Glucose, Bld: 117 mg/dL (ref 73–118)
Potassium: 4.7 mEq/L (ref 3.3–4.7)
Sodium: 141 mEq/L (ref 128–145)
Total Bilirubin: 0.5 mg/dl (ref 0.20–1.60)
Total Protein: 6.8 g/dL (ref 6.4–8.1)

## 2009-09-24 ENCOUNTER — Ambulatory Visit: Payer: Self-pay | Admitting: Oncology

## 2009-09-24 LAB — CBC WITH DIFFERENTIAL (CANCER CENTER ONLY)
BASO#: 0.1 10*3/uL (ref 0.0–0.2)
BASO%: 0.7 % (ref 0.0–2.0)
EOS%: 4.7 % (ref 0.0–7.0)
Eosinophils Absolute: 0.4 10*3/uL (ref 0.0–0.5)
HCT: 32.9 % — ABNORMAL LOW (ref 34.8–46.6)
HGB: 11.3 g/dL — ABNORMAL LOW (ref 11.6–15.9)
LYMPH#: 3.4 10*3/uL — ABNORMAL HIGH (ref 0.9–3.3)
LYMPH%: 42.3 % (ref 14.0–48.0)
MCH: 33.6 pg (ref 26.0–34.0)
MCHC: 34.4 g/dL (ref 32.0–36.0)
MCV: 97 fL (ref 81–101)
MONO#: 0.8 10*3/uL (ref 0.1–0.9)
MONO%: 10.2 % (ref 0.0–13.0)
NEUT#: 3.3 10*3/uL (ref 1.5–6.5)
NEUT%: 42.1 % (ref 39.6–80.0)
Platelets: 295 10*3/uL (ref 145–400)
RBC: 3.37 10*6/uL — ABNORMAL LOW (ref 3.70–5.32)
RDW: 11.4 % (ref 10.5–14.6)
WBC: 7.9 10*3/uL (ref 3.9–10.0)

## 2009-09-25 LAB — FERRITIN: Ferritin: 514 ng/mL — ABNORMAL HIGH (ref 10–291)

## 2009-09-25 LAB — IRON AND TIBC
%SAT: 74 % — ABNORMAL HIGH (ref 20–55)
Iron: 162 ug/dL — ABNORMAL HIGH (ref 42–145)
TIBC: 219 ug/dL — ABNORMAL LOW (ref 250–470)
UIBC: 57 ug/dL

## 2009-10-25 ENCOUNTER — Ambulatory Visit: Payer: Self-pay | Admitting: Oncology

## 2009-11-04 LAB — CBC WITH DIFFERENTIAL (CANCER CENTER ONLY)
BASO#: 0.1 10*3/uL (ref 0.0–0.2)
BASO%: 0.6 % (ref 0.0–2.0)
EOS%: 2.8 % (ref 0.0–7.0)
Eosinophils Absolute: 0.3 10*3/uL (ref 0.0–0.5)
HCT: 34.2 % — ABNORMAL LOW (ref 34.8–46.6)
HGB: 11.5 g/dL — ABNORMAL LOW (ref 11.6–15.9)
LYMPH#: 3.4 10*3/uL — ABNORMAL HIGH (ref 0.9–3.3)
LYMPH%: 34.4 % (ref 14.0–48.0)
MCH: 33 pg (ref 26.0–34.0)
MCHC: 33.6 g/dL (ref 32.0–36.0)
MCV: 98 fL (ref 81–101)
MONO#: 0.8 10*3/uL (ref 0.1–0.9)
MONO%: 8 % (ref 0.0–13.0)
NEUT#: 5.4 10*3/uL (ref 1.5–6.5)
NEUT%: 54.2 % (ref 39.6–80.0)
Platelets: 311 10*3/uL (ref 145–400)
RBC: 3.48 10*6/uL — ABNORMAL LOW (ref 3.70–5.32)
RDW: 11.1 % (ref 10.5–14.6)
WBC: 10 10*3/uL (ref 3.9–10.0)

## 2009-11-05 LAB — IRON AND TIBC
Iron: 165 ug/dL — ABNORMAL HIGH (ref 42–145)
UIBC: <55 ug/dL

## 2009-11-05 LAB — HEPATIC FUNCTION PANEL
ALT: 14 U/L (ref 0–35)
AST: 21 U/L (ref 0–37)
Albumin: 3.7 g/dL (ref 3.5–5.2)
Alkaline Phosphatase: 76 U/L (ref 39–117)
Bilirubin, Direct: <0.1 mg/dL (ref 0.0–0.3)
Total Bilirubin: 0.8 mg/dL (ref 0.3–1.2)
Total Protein: 6.2 g/dL (ref 6.0–8.3)

## 2009-11-05 LAB — FERRITIN: Ferritin: 401 ng/mL — ABNORMAL HIGH (ref 10–291)

## 2009-12-07 ENCOUNTER — Ambulatory Visit: Payer: Self-pay | Admitting: Oncology

## 2009-12-09 LAB — IRON AND TIBC
Iron: 167 ug/dL — ABNORMAL HIGH (ref 42–145)
UIBC: <55 ug/dL

## 2009-12-09 LAB — CBC WITH DIFFERENTIAL (CANCER CENTER ONLY)
BASO#: 0.1 10*3/uL (ref 0.0–0.2)
BASO%: 0.7 % (ref 0.0–2.0)
EOS%: 3.5 % (ref 0.0–7.0)
Eosinophils Absolute: 0.3 10*3/uL (ref 0.0–0.5)
HCT: 36.5 % (ref 34.8–46.6)
HGB: 12.3 g/dL (ref 11.6–15.9)
LYMPH#: 3.2 10*3/uL (ref 0.9–3.3)
LYMPH%: 37.1 % (ref 14.0–48.0)
MCH: 33.6 pg (ref 26.0–34.0)
MCHC: 33.7 g/dL (ref 32.0–36.0)
MCV: 100 fL (ref 81–101)
MONO#: 0.9 10*3/uL (ref 0.1–0.9)
MONO%: 10.6 % (ref 0.0–13.0)
NEUT#: 4.1 10*3/uL (ref 1.5–6.5)
NEUT%: 48.1 % (ref 39.6–80.0)
Platelets: 320 10*3/uL (ref 145–400)
RBC: 3.66 10*6/uL — ABNORMAL LOW (ref 3.70–5.32)
RDW: 10.4 % — ABNORMAL LOW (ref 10.5–14.6)
WBC: 8.5 10*3/uL (ref 3.9–10.0)

## 2009-12-09 LAB — FERRITIN: Ferritin: 438 ng/mL — ABNORMAL HIGH (ref 10–291)

## 2010-02-01 ENCOUNTER — Ambulatory Visit: Payer: Self-pay | Admitting: Family Medicine

## 2010-02-09 ENCOUNTER — Ambulatory Visit: Payer: Self-pay | Admitting: Oncology

## 2010-02-11 LAB — CBC WITH DIFFERENTIAL/PLATELET
BASO%: 0.6 % (ref 0.0–2.0)
Basophils Absolute: 0.1 10*3/uL (ref 0.0–0.1)
EOS%: 1.8 % (ref 0.0–7.0)
Eosinophils Absolute: 0.2 10*3/uL (ref 0.0–0.5)
HCT: 35.3 % (ref 34.8–46.6)
HGB: 12.2 g/dL (ref 11.6–15.9)
LYMPH%: 27.9 % (ref 14.0–49.7)
MCH: 34.2 pg — ABNORMAL HIGH (ref 25.1–34.0)
MCHC: 34.5 g/dL (ref 31.5–36.0)
MCV: 99.3 fL (ref 79.5–101.0)
MONO#: 0.9 10*3/uL (ref 0.1–0.9)
MONO%: 8.5 % (ref 0.0–14.0)
NEUT#: 6.3 10*3/uL (ref 1.5–6.5)
NEUT%: 61.2 % (ref 38.4–76.8)
Platelets: 290 10*3/uL (ref 145–400)
RBC: 3.55 10*6/uL — ABNORMAL LOW (ref 3.70–5.45)
RDW: 12.6 % (ref 11.2–14.5)
WBC: 10.4 10*3/uL — ABNORMAL HIGH (ref 3.9–10.3)
lymph#: 2.9 10*3/uL (ref 0.9–3.3)

## 2010-02-11 LAB — IRON AND TIBC
Iron: 155 ug/dL — ABNORMAL HIGH (ref 42–145)
UIBC: <55 ug/dL

## 2010-02-11 LAB — FERRITIN: Ferritin: 521 ng/mL — ABNORMAL HIGH (ref 10–291)

## 2010-04-06 ENCOUNTER — Ambulatory Visit: Payer: Self-pay | Admitting: Oncology

## 2010-04-08 LAB — CMP (CANCER CENTER ONLY)
ALT(SGPT): 18 U/L (ref 10–47)
AST: 27 U/L (ref 11–38)
Albumin: 3.4 g/dL (ref 3.3–5.5)
Alkaline Phosphatase: 78 U/L (ref 26–84)
BUN, Bld: 32 mg/dL — ABNORMAL HIGH (ref 7–22)
CO2: 28 mEq/L (ref 18–33)
Calcium: 9 mg/dL (ref 8.0–10.3)
Chloride: 104 mEq/L (ref 98–108)
Creat: 1.3 mg/dl — ABNORMAL HIGH (ref 0.6–1.2)
Glucose, Bld: 143 mg/dL — ABNORMAL HIGH (ref 73–118)
Potassium: 4.8 mEq/L — ABNORMAL HIGH (ref 3.3–4.7)
Sodium: 139 mEq/L (ref 128–145)
Total Bilirubin: 0.4 mg/dl (ref 0.20–1.60)
Total Protein: 6.5 g/dL (ref 6.4–8.1)

## 2010-04-08 LAB — CBC WITH DIFFERENTIAL (CANCER CENTER ONLY)
BASO#: 0.1 10*3/uL (ref 0.0–0.2)
BASO%: 0.7 % (ref 0.0–2.0)
EOS%: 3.3 % (ref 0.0–7.0)
Eosinophils Absolute: 0.3 10*3/uL (ref 0.0–0.5)
HCT: 33 % — ABNORMAL LOW (ref 34.8–46.6)
HGB: 11.5 g/dL — ABNORMAL LOW (ref 11.6–15.9)
LYMPH#: 2.8 10*3/uL (ref 0.9–3.3)
LYMPH%: 32.9 % (ref 14.0–48.0)
MCH: 34 pg (ref 26.0–34.0)
MCHC: 34.8 g/dL (ref 32.0–36.0)
MCV: 98 fL (ref 81–101)
MONO#: 0.5 10*3/uL (ref 0.1–0.9)
MONO%: 6.1 % (ref 0.0–13.0)
NEUT#: 4.9 10*3/uL (ref 1.5–6.5)
NEUT%: 57 % (ref 39.6–80.0)
Platelets: 314 10*3/uL (ref 145–400)
RBC: 3.37 10*6/uL — ABNORMAL LOW (ref 3.70–5.32)
RDW: 11.5 % (ref 10.5–14.6)
WBC: 8.6 10*3/uL (ref 3.9–10.0)

## 2010-04-11 LAB — FERRITIN: Ferritin: 454 ng/mL — ABNORMAL HIGH (ref 10–291)

## 2010-04-11 LAB — IRON AND TIBC
%SAT: 65 % — ABNORMAL HIGH (ref 20–55)
Iron: 122 ug/dL (ref 42–145)
TIBC: 188 ug/dL — ABNORMAL LOW (ref 250–470)
UIBC: 66 ug/dL

## 2010-05-17 ENCOUNTER — Ambulatory Visit: Payer: Self-pay | Admitting: Gynecology

## 2010-05-18 ENCOUNTER — Ambulatory Visit: Payer: Self-pay | Admitting: Gynecology

## 2010-06-17 ENCOUNTER — Ambulatory Visit: Payer: Self-pay | Admitting: Oncology

## 2010-06-17 LAB — CBC WITH DIFFERENTIAL/PLATELET
BASO%: 0.5 % (ref 0.0–2.0)
EOS%: 3.2 % (ref 0.0–7.0)
MCH: 34.1 pg — ABNORMAL HIGH (ref 25.1–34.0)
MCHC: 34.2 g/dL (ref 31.5–36.0)
MONO#: 0.9 10*3/uL (ref 0.1–0.9)
RBC: 3.53 10*6/uL — ABNORMAL LOW (ref 3.70–5.45)
WBC: 9.5 10*3/uL (ref 3.9–10.3)
lymph#: 2.9 10*3/uL (ref 0.9–3.3)

## 2010-06-17 LAB — IRON AND TIBC
Iron: 152 ug/dL — ABNORMAL HIGH (ref 42–145)
UIBC: <55 ug/dL

## 2010-07-31 ENCOUNTER — Encounter: Payer: Self-pay | Admitting: Oncology

## 2010-09-12 ENCOUNTER — Ambulatory Visit (INDEPENDENT_AMBULATORY_CARE_PROVIDER_SITE_OTHER): Payer: Medicare Other | Admitting: Family Medicine

## 2010-09-12 ENCOUNTER — Ambulatory Visit: Payer: Self-pay | Admitting: Family Medicine

## 2010-09-12 DIAGNOSIS — Z79899 Other long term (current) drug therapy: Secondary | ICD-10-CM

## 2010-09-12 DIAGNOSIS — I10 Essential (primary) hypertension: Secondary | ICD-10-CM

## 2010-09-12 DIAGNOSIS — M810 Age-related osteoporosis without current pathological fracture: Secondary | ICD-10-CM

## 2010-10-17 ENCOUNTER — Ambulatory Visit (INDEPENDENT_AMBULATORY_CARE_PROVIDER_SITE_OTHER): Payer: Medicare Other | Admitting: Family Medicine

## 2010-10-17 DIAGNOSIS — M129 Arthropathy, unspecified: Secondary | ICD-10-CM

## 2010-10-17 DIAGNOSIS — R7989 Other specified abnormal findings of blood chemistry: Secondary | ICD-10-CM

## 2010-10-17 DIAGNOSIS — Z79899 Other long term (current) drug therapy: Secondary | ICD-10-CM

## 2010-10-18 ENCOUNTER — Other Ambulatory Visit: Payer: Self-pay | Admitting: Oncology

## 2010-10-18 ENCOUNTER — Encounter (HOSPITAL_BASED_OUTPATIENT_CLINIC_OR_DEPARTMENT_OTHER): Payer: Medicare Other | Admitting: Oncology

## 2010-10-18 DIAGNOSIS — C50919 Malignant neoplasm of unspecified site of unspecified female breast: Secondary | ICD-10-CM

## 2010-10-18 LAB — CBC WITH DIFFERENTIAL/PLATELET
BASO%: 1 % (ref 0.0–2.0)
Basophils Absolute: 0.1 10*3/uL (ref 0.0–0.1)
EOS%: 1.9 % (ref 0.0–7.0)
HCT: 34.9 % (ref 34.8–46.6)
HGB: 12 g/dL (ref 11.6–15.9)
MCHC: 34.3 g/dL (ref 31.5–36.0)
MONO#: 1 10*3/uL — ABNORMAL HIGH (ref 0.1–0.9)
NEUT%: 54.7 % (ref 38.4–76.8)
RDW: 12.4 % (ref 11.2–14.5)
WBC: 9.2 10*3/uL (ref 3.9–10.3)
lymph#: 2.9 10*3/uL (ref 0.9–3.3)

## 2010-10-18 LAB — IRON AND TIBC
Iron: 150 ug/dL — ABNORMAL HIGH (ref 42–145)
UIBC: <55 ug/dL

## 2010-10-25 ENCOUNTER — Encounter (HOSPITAL_BASED_OUTPATIENT_CLINIC_OR_DEPARTMENT_OTHER): Payer: Medicare Other | Admitting: Oncology

## 2010-10-25 DIAGNOSIS — C50919 Malignant neoplasm of unspecified site of unspecified female breast: Secondary | ICD-10-CM

## 2010-11-22 NOTE — Op Note (Signed)
Tammy Lloyd, BJELLAND NO.:  [id number]   MEDICAL RECORD NO.:  [id number]          PATIENT TYPE:  OBV   LOCATION:  2899                         FACILITY:  [facility]   PHYSICIAN:  Jene Every, M.D.    DATE OF BIRTH:  04-14-1924   DATE OF PROCEDURE:  02/04/2007  DATE OF DISCHARGE:                               OPERATIVE REPORT   PREOPERATIVE DIAGNOSIS:  Spinal stenosis L3-L4, L4-L5.   POSTOPERATIVE DIAGNOSIS:  Spinal stenosis L3-L4, L4-L5.   PROCEDURE PERFORMED:  Insertion of X-STOP L3-L4, L4-L5.   ANESTHESIA:  General.   ASSISTANT:  Schrader   BRIEF HISTORY AND INDICATIONS:  This is an 77 year old with enterogenic  claudication secondary to spinal stenosis.  This was particularly at L3-  L4 and L4-L5.  She had weakness with ambulation  relieved with sitting.  MRI indicated stenosis at L3-L4 and at L4-L5.  She had undergone  conservative treatment.  She was indicated for insertion of X-STOP at L3-  L4 and L4-L5.  Risks and benefits discussed including bleeding,  infection, no change in symptoms, worsening of symptoms, fracture, need  for decompression, anesthetic complications, DVT, PE, need for revision,  etc.   TECHNIQUE:  With the patient in the supine position after the induction  of adequate general anesthesia and 1 gram of Kefzol, she was placed  prone on the Wilson frame.  All bony prominences were well padded.  The  Wilson frame was flexed.  The lumbar region was prepped and draped in  the usual sterile fashion.  We used x-ray, C-arm, to confirm the space.  We made an incision from the spinous process of 3 to 5.  Subcutaneous  tissue was dissected.  Electrocautery was utilized to achieve  hemostasis.  The dorsal lumbar fascia was identified and divided on  either line of the interspinous ligament.  Paraspinous muscles were  elevated from the lamina of 3, 4 and 5.  Marcaine 0.25% with epinephrine  was infiltrated in the paraspinous musculature.  Under  x-ray we used a  dilator to find the space between the spinous process of 3 and 4.  This  was advanced from right to left posterior to the canal without  difficulty.  I then inserted the dilator and I dilated and felt  comfortable with an insertion size of 10.  This appeared satisfactory on  the x-rays.  I then inserted the X-STOP from right to left, 10 in size  and then clamped the fin to the contralateral side, clamped the fins  cephalad and caudad and then engaged the set screw to 2 clicks.  Following this the AP and the lateral plane were found to be in  satisfactory position with palpation.  It was found to be stable at its  insertion.  No fractures were noted.  We then turned our attention to 3-  4 where the other level of stenosis was and in a similar fashion entered  with a dilator and then a distractor.  Again, 10 was felt to be  appropriate.  I then inserted the 10 X-STOP pointing slightly up  on the  spinous process of 3 to facilitate insertion.  I then engaged the thin  contralateral side, the set screw.  The fins were clamped together and  the set screw engaged 2 clicks.  This also was stable.  No evidence of a  fracture.  It was felt there was some distraction on the lateral x-ray.  In the AP and lateral plane there was excellent placement of the X-STOP.  No complications were noted.  The wound was then copiously irrigated.  Electrocautery was utilized to achieve hemostasis.  The dorsal lumbar  fascia reapproximated with #1 Vicryl interrupted figure-of-8 stitches.  The subcutaneous tissue reapproximated with 2-0 Vicryl simple suture.  The  skin was reapproximated  with staples.  The wound was dressed sterilely.  She was placed supine on the hospital bed, extubated without difficulty  and transported to the recovery room in satisfactory condition.   The patient tolerated the procedure well.  No complications.      Jene Every, M.D.  Electronically Signed      JB/MEDQ  D:  02/04/2007  T:  02/04/2007  Job:  [id number]

## 2010-11-24 ENCOUNTER — Encounter (HOSPITAL_BASED_OUTPATIENT_CLINIC_OR_DEPARTMENT_OTHER): Payer: Medicare Other | Admitting: Oncology

## 2010-11-25 NOTE — H&P (Signed)
Tammy Lloyd, Tammy Lloyd                  ACCOUNT NO.:  [id number]   MEDICAL RECORD NO.:  [id number]          PATIENT TYPE:  INP   LOCATION:  1506                         FACILITY:  [facility]   PHYSICIAN:  Ollen Gross, M.D.    DATE OF BIRTH:  February 25, 1924   DATE OF ADMISSION:  09/19/2005  DATE OF DISCHARGE:  09/23/2005                                HISTORY & PHYSICAL   CHIEF COMPLAINT:  Right knee pain.   HISTORY OF PRESENT ILLNESS:  Patient is a 77 year old female who has had  persistent right knee pain.  She has previously undergone a right total knee  arthroplasty, but continues to have soreness with ambulation.  She has been  seen by Dr. Lequita Halt for ongoing pain.  Original surgery was done back in  August of 2006.  Through work-up it is felt that she has a loose tibial  component.  Due to the continued pain it is felt she would benefit from  undergoing revision surgery.  Risks and benefits have been discussed and  patient has elected to proceed with surgery.   ALLERGIES:  LISINOPRIL/HYDROCHLOROTHIAZIDE causes coughing.   CURRENT MEDICATIONS:  Aromasin, bisoprolol, hydrochlorothiazide, Fosamax,  calcium plus D, Tylenol, Benadryl, Aranesp.   PAST MEDICAL HISTORY:  1.  Hypertension.  2.  Slight urinary incontinence.  3.  Anemia.  4.  Breast cancer.  5.  Osteoarthritis.  6.  History of candidiasis.   PAST SURGICAL HISTORY:  1.  Cataract right eye in 2003, left eye also in 2003.  2.  Right breast cancer surgery in 2003.  3.  Right total knee replacement August 2006.  4.  D&C 2005.   SOCIAL HISTORY:  Married.  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  Nonsmoker.  No alcohol.  One child.  Husband and daughter will be  assisting with care after surgery.   FAMILY HISTORY:  Brother with heart disease and heart attack.  Two sisters  with breast cancer.  Father with liver cancer.  Mother with had to have a  hysterectomy and two brothers with prostate cancer.   REVIEW OF  SYSTEMS:  GENERAL:  No fevers, chills, night sweats.  NEUROLOGIC:  No seizures, syncope, paralysis.  RESPIRATORY:  No shortness of breath,  productive cough, or hemoptysis.  CARDIOVASCULAR:  No chest pain, angina,  orthopnea.  GI:  No nausea, vomiting, diarrhea, or constipation.  GU:  Slightly incontinent.  No dysuria, hematuria.  MUSCULOSKELETAL:  Right knee.   PHYSICAL EXAMINATION:  VITAL SIGNS:  Pulse 64, respirations 12, blood  pressure 170/75.  GENERAL:  A 77 year old white female well-nourished, well-developed, in no  acute distress.  Mildly anxious.  Accompanied by her husband.  She is alert,  oriented, cooperative, pleasant.  HEENT:  Normocephalic, atraumatic.  Pupils equal, round, and reactive.  Oropharynx clear.  NECK:  Supple.  CHEST:  Clear.  HEART:  Regular rate and rhythm without murmur.  ABDOMEN:  Soft and nontender.  Bowel sounds present.  RECTAL:  Not done.  Not pertinent to present illness.  BREASTS:  Not done.  Not pertinent to present illness.  GENITALIA:  Not done.  Not pertinent to present illness.  EXTREMITIES:  Right knee range of motion of 0-120.  No instability.  No  crepitus.   IMPRESSION:  Loose right tibial component total knee.   PLAN:  Patient admitted to [facility].  Undergo right tibial  revision versus right total knee arthroplasty revision.  Surgery will be  performed by Dr. Ollen Gross.  Patient has been seen by Dr. Sharlot Gowda  and is felt cleared for undergoing surgery.      Alexzandrew L. Julien Girt, P.A.      Ollen Gross, M.D.  Electronically Signed    ALP/MEDQ  D:  10/08/2005  T:  10/10/2005  Job:  [id number]   cc:   Sharlot Gowda, M.D.  Fax: [phone]

## 2010-11-25 NOTE — Discharge Summary (Signed)
Tammy Lloyd, Tammy Lloyd                  ACCOUNT NO.:  [id number]   MEDICAL RECORD NO.:  [id number]          PATIENT TYPE:  INP   LOCATION:  1506                         FACILITY:  [facility]   PHYSICIAN:  Ollen Gross, M.D.    DATE OF BIRTH:  April 20, 1924   DATE OF ADMISSION:  09/19/2005  DATE OF DISCHARGE:  09/23/2005                                 DISCHARGE SUMMARY   ADMITTING DIAGNOSES:  1.  Loose right tibial component.  2.  Hypertension.  3.  Slight urinary incontinence.  4.  Anemia.  5.  Breast cancer.  6.  Osteoarthritis.  7.  History of candidiasis.   DISCHARGE DIAGNOSES:  1.  Loose right tibial component, total knee, status post right tibial      revision.  2.  Postoperative blood loss anemia.  3.  Postoperative hyponatremia.  4.  Postoperative hypokalemia.   The remaining discharge diagnoses same as admitting diagnoses.   PROCEDURE:  On September 19, 2005, right tibial revision.  Surgeon:  Dr.  Lequita Halt.  Surgical assistant:  A. Perkins, PA-C.  Anesthesia:  Spinal.  Tourniquet time:  63 minutes.   CONSULTS:  None.   BRIEF HISTORY:  Tammy Lloyd is a 77 year old female with a right total knee  performed in August 2006.  Initially did well but developed progressive  worsening pain.  X-rays, unfortunately, showed collapse into significant  varus, having significant mechanical symptoms and admitted for surgical  intervention.   LABORATORY DATA:  Preop CBC:  Hemoglobin 13.3, hematocrit 39, white cell  count 8.5.  CBC was followed.  Hemoglobin dropped down to 9.7, back up to  10.4.  PT and PTT preop 13.3 and 30, respectively.  INR 1.1.  Serial pro  time followed and 25.9 and 2.4.  Chem panel on admission all within normal  limits.  Serial electrolytes were followed.  Sodium dropped from 143 to 132,  stabilized at 132.  Potassium dropped from 4.1 to 3.4, back up to 4.  Glucose went up from 91 to 172, back down to 111.  Preop UA negative.  STAT  Gram stain:  No organisms.  Wound  culture showed no organisms on the smear,  no growth after two days.  Anaerobic cultures, no organisms on the smear, no  anaerobes isolated.   HOSPITAL COURSE:  Admitted to [facility].  Tolerated the procedure  well.  Later transferred to the recovery room and the orthopedic floor for  continued postoperative care.  On day one, was doing fairly well.  Started  on PCA and p.o. analgesics.  Hemovac drain that was placed during the  surgery was pulled.  Started to get up out of bed.  By day two, pretty sore  in the leg, however, was tolerating meds.  Had been weaned over to p.o.  medications.  Had a drop in sodium and potassium.  Fluids were discontinued  and placed on potassium supplements.  Actually got up and walked about 90  feet.  Dressing was changed and incision looked very good.  By day three,  she was doing about the same.  Started to progress with physical therapy and  actually was up about 200 feet ambulating.  Started with suppository.  Felt  to be ready for the following day discharge.  Seen on rounds on September 23, 2005, doing well, ambulating 200 feet, and was discharged home.   DISCHARGE PLAN:  The patient is discharged home on September 23, 2005.  For  discharge diagnoses, please see above.   DISCHARGE MEDICATIONS:  Percocet, Robaxin, Coumadin.   DIET:  As tolerated.   FOLLOWUP:  Follow up in two weeks.   ACTIVITY:  Weightbearing as tolerated.  Total knee protocol.  Home PT and  home health nursing.   DISPOSITION:  Home.   CONDITION ON DISCHARGE:  Improved.      Alexzandrew L. Julien Girt, P.A.      Ollen Gross, M.D.  Electronically Signed    ALP/MEDQ  D:  11/08/2005  T:  11/09/2005  Job:  [id number]

## 2010-12-07 ENCOUNTER — Ambulatory Visit (INDEPENDENT_AMBULATORY_CARE_PROVIDER_SITE_OTHER): Payer: Medicare Other | Admitting: Medical

## 2010-12-07 ENCOUNTER — Encounter: Payer: Self-pay | Admitting: Medical

## 2010-12-07 VITALS — BP 184/58 | HR 58 | Temp 97.7°F | Wt 201.0 lb

## 2010-12-07 DIAGNOSIS — I10 Essential (primary) hypertension: Secondary | ICD-10-CM

## 2010-12-07 DIAGNOSIS — R609 Edema, unspecified: Secondary | ICD-10-CM

## 2010-12-07 MED ORDER — BISOPROLOL-HYDROCHLOROTHIAZIDE 10-6.25 MG PO TABS: ORAL_TABLET | ORAL | Status: DC

## 2010-12-07 MED ORDER — AMLODIPINE BESYLATE 5 MG PO TABS: ORAL_TABLET | ORAL | Status: DC

## 2010-12-07 NOTE — Patient Instructions (Signed)
Changes today:  Double up on your Amlodipine 5mg  tablet so you will be taking 2 tablets daily.  Cut your Bisoprolol/HCT in half, and just take 1/2 tablet daily.    Check your blood pressure daily, and call in some blood pressure readings in 10 days.

## 2010-12-07 NOTE — Progress Notes (Signed)
Subjective:   HPI  Tammy Lloyd is a 77 y.o. female who presents for recheck on blood pressure. She notes that her top blood pressure number has been elevated despite her current medication. She is taking 2 medications currently, was also Micardis, but this was too expensive and not taking this. She also reports some recent swelling in her ankles and feet, mild, improved with leg elevation. At her last visit in April, there were some comments about her kidney function. She is worried about her kidneys in light of the new edema.  No other aggravating or relieving factors.  No other c/o.  The following portions of the patient's history were reviewed and updated as appropriate: allergies, current medications, past family history, past medical history, past social history, past surgical history and problem list.  Past Medical History  Diagnosis Date  . Arthritis   . Hemorrhoid   . Cataract   . Cancer     Breast  . Hypertension   . Hemochromatosis     Followed by  hematology;  . Osteoporosis   . Colon polyp      Review of Systems Constitutional: denies fever, chills, sweats, unexpected weight change, anorexia, fatigue Dermatology: denies rash Cardiology: denies chest pain, palpitations Respiratory: denies cough, shortness of breath, wheezing Gastroenterology: denies abdominal pain, nausea, vomiting, diarrhea, constipation,  Hematology: denies bleeding or bruising problems Musculoskeletal: denies arthralgias, myalgias Urology: denies dysuria, difficulty urinating, hematuria, urinary frequency, urgency Neurology: no headache, weakness, tingling, numbness     Objective:   Physical Exam  General appearance: alert, no distress, WD/WN HEENT: normocephalic, sclerae anicteric, PERRLA, EOMi, nares patent, no discharge or erythema, pharynx normal Oral cavity: MMM Neck: supple, no lymphadenopathy, no thyromegaly, no masses, no JVD, no bruits Heart: RRR, normal S1, S2, no murmurs Lungs: CTA  bilaterally, no wheezes, rhonchi, or rales Abdomen: +bs, soft, non tender, non distended, no masses, no hepatomegaly, no splenomegaly Extremities: Minimal ankle edema, no cyanosis, no clubbing Pulses: 2+ symmetric, upper and lower extremities, normal cap refill Neurological: alert, oriented, CN2-12 intact, gait normal Psychiatric: normal affect, behavior normal, pleasant    Assessment :    Encounter Diagnoses  Name Primary?  Marland Kitchen Unspecified essential hypertension Yes  . Edema      Plan:   Hypertension-we increased her amlodipine to 10 mg daily. She just got a refill on the 5 mg tablet, so she will take 2 tablets daily of the 5 mg. For now I'll have her cut her Bisoprolol/HCT down to 1/2 tablet daily.  She will check her blood pressure daily with her new meter, she will call report in 10 days. If her diastolic blood pressures less than 50, she will let us know. Recheck in one month, sooner as needed.  Edema-discussed her recent lab results, symptoms, and potential side effects of medication including lower extremity edema with amlodipine. Advised that the edema does not suggest worrisome etiology at this point, advise leg elevation, and of note, minimal edema on exam today.

## 2010-12-19 ENCOUNTER — Encounter: Payer: Self-pay | Admitting: Family Medicine

## 2010-12-19 ENCOUNTER — Ambulatory Visit (INDEPENDENT_AMBULATORY_CARE_PROVIDER_SITE_OTHER): Payer: Medicare Other | Admitting: Family Medicine

## 2010-12-19 VITALS — BP 160/50 | HR 60 | Wt 200.0 lb

## 2010-12-19 DIAGNOSIS — M1990 Unspecified osteoarthritis, unspecified site: Secondary | ICD-10-CM | POA: Insufficient documentation

## 2010-12-19 DIAGNOSIS — I10 Essential (primary) hypertension: Secondary | ICD-10-CM

## 2010-12-19 DIAGNOSIS — M129 Arthropathy, unspecified: Secondary | ICD-10-CM

## 2010-12-19 DIAGNOSIS — Z853 Personal history of malignant neoplasm of breast: Secondary | ICD-10-CM

## 2010-12-19 DIAGNOSIS — Z79899 Other long term (current) drug therapy: Secondary | ICD-10-CM

## 2010-12-19 DIAGNOSIS — M810 Age-related osteoporosis without current pathological fracture: Secondary | ICD-10-CM

## 2010-12-19 LAB — RENAL FUNCTION PANEL
Calcium: 9.1 mg/dL (ref 8.4–10.5)
Glucose, Bld: 101 mg/dL — ABNORMAL HIGH (ref 70–99)
Potassium: 4.3 mEq/L (ref 3.5–5.3)
Sodium: 140 mEq/L (ref 135–145)

## 2010-12-19 NOTE — Patient Instructions (Addendum)
We'll keep you on at same dosing and check you back here in about a month

## 2010-12-19 NOTE — Progress Notes (Signed)
  Subjective:    Patient ID: Tammy Lloyd, female    DOB: 05/28/1924, 77 y.o.   MRN: [id number]  HPI on her last visit her medications were readjusted. She has been on Norvasc 10 mg for approximately 2 weeks. She is also cut back on her other medication. She has noted a slight increase in ankle swelling. Review of her record indicates she has had some prerenal azotemia.   Review of Systems No other concerns or complaints    Objective:   Physical Exam Alert and in no distress. Systolic blood pressures run in the 160-170 range and diastolics in the low 60s as per her records       Assessment & Plan:  Hypertension. History of prerenal azotemia Continue on present medication regimen. I will also check a renal profile she has had some difficulty with renal function in the past.

## 2010-12-21 ENCOUNTER — Telehealth: Payer: Self-pay

## 2010-12-21 NOTE — Telephone Encounter (Signed)
Pt called and informed about labs 

## 2010-12-27 ENCOUNTER — Encounter: Payer: Self-pay | Admitting: Family Medicine

## 2010-12-29 ENCOUNTER — Encounter (HOSPITAL_BASED_OUTPATIENT_CLINIC_OR_DEPARTMENT_OTHER): Payer: Medicare Other | Admitting: Oncology

## 2010-12-29 ENCOUNTER — Other Ambulatory Visit: Payer: Self-pay | Admitting: Oncology

## 2010-12-29 DIAGNOSIS — C50919 Malignant neoplasm of unspecified site of unspecified female breast: Secondary | ICD-10-CM

## 2010-12-29 LAB — CBC WITH DIFFERENTIAL/PLATELET
BASO%: 0.5 % (ref 0.0–2.0)
HCT: 35.8 % (ref 34.8–46.6)
MCHC: 34.1 g/dL (ref 31.5–36.0)
MONO#: 1.2 10*3/uL — ABNORMAL HIGH (ref 0.1–0.9)
NEUT%: 46.7 % (ref 38.4–76.8)
RBC: 3.77 10*6/uL (ref 3.70–5.45)
WBC: 10.3 10*3/uL (ref 3.9–10.3)
lymph#: 3.9 10*3/uL — ABNORMAL HIGH (ref 0.9–3.3)
nRBC: 0 % (ref 0–0)

## 2010-12-29 LAB — TECHNOLOGIST REVIEW

## 2011-01-10 ENCOUNTER — Telehealth: Payer: Self-pay | Admitting: Family Medicine

## 2011-01-10 MED ORDER — AMLODIPINE BESYLATE 10 MG PO TABS: 10.0000 mg | ORAL_TABLET | Freq: Every day | ORAL | Status: DC

## 2011-01-10 NOTE — Telephone Encounter (Signed)
Pt called said we increased her Amlodipine from 1 qd to 2 qd.  She is now running out of her rx.  She needs another rx sent to [facility] to correct her dosage.  Please advise pt when done.

## 2011-01-12 ENCOUNTER — Telehealth: Payer: Self-pay | Admitting: Family Medicine

## 2011-01-12 NOTE — Telephone Encounter (Signed)
Pt should be taking 1 qd Amlodipine 10mg  not 2.  She was taking 2 qd of 5 mg.  Called [facility] and patient to clarify same.

## 2011-01-16 ENCOUNTER — Encounter: Payer: Self-pay | Admitting: Family Medicine

## 2011-01-18 ENCOUNTER — Encounter: Payer: Self-pay | Admitting: Family Medicine

## 2011-01-18 ENCOUNTER — Ambulatory Visit (INDEPENDENT_AMBULATORY_CARE_PROVIDER_SITE_OTHER): Payer: Medicare Other | Admitting: Family Medicine

## 2011-01-18 VITALS — BP 140/60 | HR 52 | Wt 200.0 lb

## 2011-01-18 DIAGNOSIS — I10 Essential (primary) hypertension: Secondary | ICD-10-CM

## 2011-01-18 DIAGNOSIS — R609 Edema, unspecified: Secondary | ICD-10-CM

## 2011-01-18 MED ORDER — LOSARTAN POTASSIUM-HCTZ 100-25 MG PO TABS: 1.0000 | ORAL_TABLET | Freq: Every day | ORAL | Status: DC

## 2011-01-18 NOTE — Progress Notes (Signed)
  Subjective:    Patient ID: Tammy Lloyd, female    DOB: 1924-05-18, 77 y.o.   MRN: [id number]  HPI She is here for recheck. She notes continued difficulty with lower extremity edema but does not get better after she's had a night of rest. Review of her record indicates she had an ACE inhibitor in the past but developed a cough. She presently is on amlodipine as well as z-axis.   Review of Systems     Objective:   Physical Exam Alert and in no distress. Blood pressure is recorded. Edema is noted in her lower extremities.       Assessment & Plan:  Hypertension. Peripheral edema. I will have her stop both the z-axis and Norvasc. I will switch her to Hyzaar. Recheck here in one month.

## 2011-01-18 NOTE — Patient Instructions (Addendum)
Stop the amlodipine and bisoprolol. Come back here in a month we'll check your pressure again and also let me know how you're feet are doing

## 2011-01-24 ENCOUNTER — Other Ambulatory Visit: Payer: Self-pay | Admitting: Oncology

## 2011-01-24 ENCOUNTER — Encounter (HOSPITAL_BASED_OUTPATIENT_CLINIC_OR_DEPARTMENT_OTHER): Payer: Medicare Other | Admitting: Oncology

## 2011-01-24 LAB — CBC WITH DIFFERENTIAL/PLATELET
Basophils Absolute: 0.1 10*3/uL (ref 0.0–0.1)
EOS%: 4.1 % (ref 0.0–7.0)
Eosinophils Absolute: 0.4 10*3/uL (ref 0.0–0.5)
HCT: 35.5 % (ref 34.8–46.6)
HGB: 12.2 g/dL (ref 11.6–15.9)
MCH: 33.5 pg (ref 25.1–34.0)
MCV: 97.5 fL (ref 79.5–101.0)
MONO%: 10.4 % (ref 0.0–14.0)
NEUT%: 44.8 % (ref 38.4–76.8)
lymph#: 3.7 10*3/uL — ABNORMAL HIGH (ref 0.9–3.3)

## 2011-01-24 LAB — FERRITIN: Ferritin: 308 ng/mL — ABNORMAL HIGH (ref 10–291)

## 2011-01-24 LAB — IRON AND TIBC

## 2011-02-20 ENCOUNTER — Encounter: Payer: Self-pay | Admitting: Family Medicine

## 2011-02-20 ENCOUNTER — Ambulatory Visit (INDEPENDENT_AMBULATORY_CARE_PROVIDER_SITE_OTHER): Payer: Medicare Other | Admitting: Family Medicine

## 2011-02-20 VITALS — BP 130/70 | HR 78 | Wt 200.0 lb

## 2011-02-20 DIAGNOSIS — I10 Essential (primary) hypertension: Secondary | ICD-10-CM

## 2011-02-20 DIAGNOSIS — Z79899 Other long term (current) drug therapy: Secondary | ICD-10-CM

## 2011-02-20 MED ORDER — LOSARTAN POTASSIUM-HCTZ 100-25 MG PO TABS: 1.0000 | ORAL_TABLET | Freq: Every day | ORAL | Status: DC

## 2011-02-20 NOTE — Progress Notes (Signed)
  Subjective:    Patient ID: Tammy Lloyd, female    DOB: 1924/04/20, 77 y.o.   MRN: [id number]  HPI She is here for recheck. She is having no difficulty with the medications. She also notes less swelling in her legs.   Review of Systems     Objective:   Physical Exam Alert and in no distress. Blood pressure is recorded      Assessment & Plan:  Hypertension. Peripheral edema. Continue on present medications

## 2011-04-18 LAB — DIFFERENTIAL
Basophils Absolute: 0.1
Basophils Relative: 1
Eosinophils Absolute: 0.3
Neutro Abs: 4.8
Neutrophils Relative %: 47

## 2011-04-18 LAB — CBC
MCHC: 34.2
Platelets: 282
RDW: 13.3

## 2011-04-18 LAB — CHROMOSOME ANALYSIS, BONE MARROW

## 2011-04-24 LAB — CBC
Hemoglobin: 12.3
RBC: 3.62 — ABNORMAL LOW
RDW: 13.5
WBC: 11.4 — ABNORMAL HIGH

## 2011-04-24 LAB — BASIC METABOLIC PANEL
Calcium: 9
GFR calc Af Amer: 56 — ABNORMAL LOW
GFR calc non Af Amer: 46 — ABNORMAL LOW
Glucose, Bld: 108 — ABNORMAL HIGH
Potassium: 4.2
Sodium: 139

## 2011-04-24 LAB — APTT: aPTT: 27

## 2011-04-24 LAB — URINALYSIS, ROUTINE W REFLEX MICROSCOPIC
Bilirubin Urine: NEGATIVE
Nitrite: NEGATIVE
Specific Gravity, Urine: 1.022
Urobilinogen, UA: 1

## 2011-04-24 LAB — PROTIME-INR: INR: 1.1

## 2011-05-04 ENCOUNTER — Encounter (HOSPITAL_BASED_OUTPATIENT_CLINIC_OR_DEPARTMENT_OTHER): Payer: Medicare Other | Admitting: Oncology

## 2011-05-04 ENCOUNTER — Other Ambulatory Visit: Payer: Self-pay | Admitting: Oncology

## 2011-05-04 DIAGNOSIS — Z853 Personal history of malignant neoplasm of breast: Secondary | ICD-10-CM

## 2011-05-04 LAB — IRON AND TIBC: Iron: 152 ug/dL — ABNORMAL HIGH (ref 42–145)

## 2011-05-04 LAB — CBC WITH DIFFERENTIAL/PLATELET
EOS%: 5.8 % (ref 0.0–7.0)
Eosinophils Absolute: 0.6 10*3/uL — ABNORMAL HIGH (ref 0.0–0.5)
MCH: 34.2 pg — ABNORMAL HIGH (ref 25.1–34.0)
MCV: 98.5 fL (ref 79.5–101.0)
MONO%: 10.1 % (ref 0.0–14.0)
NEUT#: 5.3 10*3/uL (ref 1.5–6.5)
RBC: 3.69 10*6/uL — ABNORMAL LOW (ref 3.70–5.45)
RDW: 12.1 % (ref 11.2–14.5)

## 2011-05-04 LAB — FERRITIN: Ferritin: 278 ng/mL (ref 10–291)

## 2011-05-05 ENCOUNTER — Encounter: Payer: Self-pay | Admitting: Family Medicine

## 2011-05-26 ENCOUNTER — Other Ambulatory Visit: Payer: Self-pay | Admitting: *Deleted

## 2011-05-26 MED ORDER — PREDNICARBATE 0.1 % EX CREA: TOPICAL_CREAM | CUTANEOUS | Status: DC

## 2011-06-08 ENCOUNTER — Other Ambulatory Visit (HOSPITAL_BASED_OUTPATIENT_CLINIC_OR_DEPARTMENT_OTHER): Payer: Medicare Other | Admitting: Lab

## 2011-06-08 ENCOUNTER — Other Ambulatory Visit: Payer: Self-pay

## 2011-06-08 ENCOUNTER — Ambulatory Visit: Payer: Medicare Other

## 2011-06-08 DIAGNOSIS — Z170 Estrogen receptor positive status [ER+]: Secondary | ICD-10-CM

## 2011-06-08 DIAGNOSIS — C50919 Malignant neoplasm of unspecified site of unspecified female breast: Secondary | ICD-10-CM

## 2011-06-08 LAB — CBC WITH DIFFERENTIAL/PLATELET
Basophils Absolute: 0.1 10*3/uL (ref 0.0–0.1)
EOS%: 8.1 % — ABNORMAL HIGH (ref 0.0–7.0)
HCT: 35.1 % (ref 34.8–46.6)
HGB: 11.9 g/dL (ref 11.6–15.9)
MCH: 34.1 pg — ABNORMAL HIGH (ref 25.1–34.0)
MCV: 100.7 fL (ref 79.5–101.0)
MONO%: 10.2 % (ref 0.0–14.0)
NEUT%: 43.6 % (ref 38.4–76.8)
Platelets: 278 10*3/uL (ref 145–400)

## 2011-06-08 NOTE — Patient Instructions (Signed)
8 Spoke with pt and informed her she did not need treatment today and pt given schedule with her next appt date/time listed.             Pt verbalized understanding of instructions and to call if any problems.

## 2011-06-08 NOTE — Progress Notes (Signed)
[id number] per Dr. Welton Flakes, pt does not need Phlebotomy today.

## 2011-06-13 ENCOUNTER — Encounter: Payer: Self-pay | Admitting: Family Medicine

## 2011-06-13 ENCOUNTER — Ambulatory Visit (INDEPENDENT_AMBULATORY_CARE_PROVIDER_SITE_OTHER): Payer: Medicare Other | Admitting: Family Medicine

## 2011-06-13 VITALS — BP 150/60 | HR 80 | Wt 207.0 lb

## 2011-06-13 DIAGNOSIS — I10 Essential (primary) hypertension: Secondary | ICD-10-CM

## 2011-06-13 MED ORDER — AMLODIPINE BESYLATE 5 MG PO TABS: 5.0000 mg | ORAL_TABLET | Freq: Every day | ORAL | Status: DC

## 2011-06-13 MED ORDER — LOSARTAN POTASSIUM-HCTZ 100-25 MG PO TABS: 1.0000 | ORAL_TABLET | Freq: Every day | ORAL | Status: DC

## 2011-06-13 NOTE — Progress Notes (Signed)
  Subjective:    Patient ID: Tammy Lloyd, female    DOB: Mar 25, 1924, 77 y.o.   MRN: [id number]  HPI She is here for consult concerning her blood pressure. She has been taking readings at home and that does show systolic elevations with good diastolic readings. She presently is on Hyzaar.   Review of Systems     Objective:   Physical Exam Alert and in no distress. Blood pressure is recorded.      Assessment & Plan:   1. Systolic hypertension    I will add amlodipine to her regimen and recheck here in approximately 6 weeks.

## 2011-06-22 ENCOUNTER — Telehealth: Payer: Self-pay | Admitting: Family Medicine

## 2011-06-22 NOTE — Telephone Encounter (Signed)
Pt called she check with [facility] on her rx refill and they advised it is in it's last stages and should go out within 10 days.  She is down to 1 pill.  We have no samples here.  Called into [facility] [phone] 30 day supply for [place] HCTZ 100/25 MG #30  NO REFILLS and Novasc 5 mg 1 qd #30  No refills.  Pt aware.

## 2011-07-06 ENCOUNTER — Other Ambulatory Visit: Payer: Self-pay

## 2011-07-06 ENCOUNTER — Other Ambulatory Visit (HOSPITAL_BASED_OUTPATIENT_CLINIC_OR_DEPARTMENT_OTHER): Payer: Medicare Other | Admitting: Lab

## 2011-07-06 ENCOUNTER — Other Ambulatory Visit: Payer: Self-pay | Admitting: *Deleted

## 2011-07-06 LAB — CBC WITH DIFFERENTIAL/PLATELET
Eosinophils Absolute: 0.6 10*3/uL — ABNORMAL HIGH (ref 0.0–0.5)
LYMPH%: 31.7 % (ref 14.0–49.7)
MCHC: 34 g/dL (ref 31.5–36.0)
MCV: 100.2 fL (ref 79.5–101.0)
MONO%: 9.3 % (ref 0.0–14.0)
NEUT#: 5.7 10*3/uL (ref 1.5–6.5)
Platelets: 261 10*3/uL (ref 145–400)
RBC: 3.47 10*6/uL — ABNORMAL LOW (ref 3.70–5.45)

## 2011-07-06 LAB — FERRITIN: Ferritin: 272 ng/mL (ref 10–291)

## 2011-07-06 NOTE — Progress Notes (Signed)
Pt in for phlebotomy today.  Pt not due for one at this time.  Pt instructed to call for any questions or concerns.  shk

## 2011-07-25 ENCOUNTER — Ambulatory Visit (INDEPENDENT_AMBULATORY_CARE_PROVIDER_SITE_OTHER): Payer: Medicare Other | Admitting: Family Medicine

## 2011-07-25 ENCOUNTER — Encounter: Payer: Self-pay | Admitting: Family Medicine

## 2011-07-25 DIAGNOSIS — Z79899 Other long term (current) drug therapy: Secondary | ICD-10-CM | POA: Diagnosis not present

## 2011-07-25 DIAGNOSIS — I10 Essential (primary) hypertension: Secondary | ICD-10-CM

## 2011-07-25 NOTE — Progress Notes (Signed)
  Subjective:    Patient ID: Tammy Lloyd, female    DOB: [birth year], 78 y.o.   MRN: [id number]  HPI She is here for a recheck. On her last visit she was placed on Norvasc. She has had no difficulty with this.   Review of Systems     Objective:   Physical Exam  Alert and in no distress. Blood pressure is recorded      Assessment & Plan:   1. Hypertension   2. Encounter for long-term (current) use of other medications    continue present medication regimen and recheck here in 6 months.

## 2011-07-25 NOTE — Patient Instructions (Signed)
Stay on your present medications and I will see you in 6 months

## 2011-08-10 ENCOUNTER — Telehealth: Payer: Self-pay | Admitting: *Deleted

## 2011-08-10 ENCOUNTER — Ambulatory Visit: Payer: Medicare Other

## 2011-08-10 ENCOUNTER — Ambulatory Visit (HOSPITAL_BASED_OUTPATIENT_CLINIC_OR_DEPARTMENT_OTHER): Payer: Medicare Other | Admitting: Oncology

## 2011-08-10 ENCOUNTER — Other Ambulatory Visit (HOSPITAL_BASED_OUTPATIENT_CLINIC_OR_DEPARTMENT_OTHER): Payer: Medicare Other | Admitting: Lab

## 2011-08-10 ENCOUNTER — Encounter: Payer: Self-pay | Admitting: Oncology

## 2011-08-10 DIAGNOSIS — M549 Dorsalgia, unspecified: Secondary | ICD-10-CM

## 2011-08-10 DIAGNOSIS — Z853 Personal history of malignant neoplasm of breast: Secondary | ICD-10-CM

## 2011-08-10 DIAGNOSIS — C50919 Malignant neoplasm of unspecified site of unspecified female breast: Secondary | ICD-10-CM

## 2011-08-10 LAB — CBC WITH DIFFERENTIAL/PLATELET
BASO%: 0.6 % (ref 0.0–2.0)
EOS%: 4.8 % (ref 0.0–7.0)
HCT: 35.9 % (ref 34.8–46.6)
LYMPH%: 30.1 % (ref 14.0–49.7)
MCH: 34.3 pg — ABNORMAL HIGH (ref 25.1–34.0)
MCHC: 34.3 g/dL (ref 31.5–36.0)
MCV: 100 fL (ref 79.5–101.0)
MONO%: 9.7 % (ref 0.0–14.0)
NEUT%: 54.8 % (ref 38.4–76.8)
lymph#: 2.8 10*3/uL (ref 0.9–3.3)

## 2011-08-10 NOTE — Telephone Encounter (Signed)
GAVE PATIENT APPOINTMENT FOR 09-28-2011

## 2011-08-13 NOTE — Progress Notes (Signed)
OFFICE PROGRESS NOTE  CC [name], MD  [name], MD, MD [address] [place] [address]  DIAGNOSIS: 78 year old with  1. Right breast cancer diagnosed November 2000  2. hemochromastosis diagnosed 2008 on phlebotomies to keep ferritin below 500  3. Osteoarthritis and chronic low back pain  4. Hypertension  5. Osteoporosis/osteopenia  CURRENT THERAPY:phlebotomies as needed  INTERVAL HISTORY: Tammy Lloyd 78 y.o. female returns for follow up visit. Overall she seems to be doing well. She continues to have pain in the back.He has no fevers or chills, no nausea or vomiting. She does have some difficulty walking and uses a cane. No bleeding or tingling or numbness. Remainder of the 10 point review of systems is negative.  MEDICAL HISTORY: Past Medical History  Diagnosis Date  . Arthritis   . Hemorrhoid   . Cataract   . Cancer     Breast  . Hypertension   . Hemochromatosis     Followed by  hematology;  . Osteoporosis   . Colon polyp     ALLERGIES:   has no known allergies.  MEDICATIONS:  Current Outpatient Prescriptions  Medication Sig Dispense Refill  . acetaminophen (TYLENOL) 325 MG tablet Take 650 mg by mouth as needed.        Marland Kitchen alendronate (FOSAMAX) 70 MG tablet Take 70 mg by mouth every 7 (seven) days. Take with a full glass of water on an empty stomach.       Marland Kitchen amLODipine (NORVASC) 5 MG tablet Take 1 tablet (5 mg total) by mouth daily.  90 tablet  3  . Cholecalciferol (VITAMIN D) 1000 UNITS capsule Take 1,000 Units by mouth daily.        Marland Kitchen losartan-hydrochlorothiazide (HYZAAR) 100-25 MG per tablet Take 1 tablet by mouth daily.  90 tablet  4  . Prednicarbate 0.1 % CREA Prednicarbate 0.1% topical cream # 60. Apply to affected area 2 times a day as directed.  60 g  0  . QUININE SULFATE PO Take 3 tablets by mouth as needed.         SURGICAL HISTORY:  Past Surgical History  Procedure Date  . Colonoscopy 2009  . Total knee  arthroplasty 2006  . Dilation and curettage of uterus 2005    REVIEW OF SYSTEMS:  Pertinent items are noted in HPI.   PHYSICAL EXAMINATION: General appearance: alert, cooperative and appears stated age Head: Normocephalic, without obvious abnormality, atraumatic Neck: no adenopathy, no carotid bruit, no JVD, supple, symmetrical, trachea midline and thyroid not enlarged, symmetric, no tenderness/mass/nodules Lymph nodes: Cervical, supraclavicular, and axillary nodes normal. Resp: clear to auscultation bilaterally and normal percussion bilaterally Back: symmetric, no curvature. ROM normal. No CVA tenderness. Cardio: regular rate and rhythm, S1, S2 normal, no murmur, click, rub or gallop GI: soft, non-tender; bowel sounds normal; no masses,  no organomegaly Extremities: edema Neurologic: Alert and oriented X 3, normal strength and tone. Normal symmetric reflexes. Normal coordination and gait  ECOG PERFORMANCE STATUS: 1 - Symptomatic but completely ambulatory  Blood pressure 153/70, pulse 83, temperature 97.6 F (36.4 C), temperature source Oral, height 5' 6.5" (1.689 m), weight 208 lb (94.348 kg).  LABORATORY DATA: Lab Results  Component Value Date   WBC 9.2 08/10/2011   HGB 12.3 08/10/2011   HCT 35.9 08/10/2011   MCV 100.0 08/10/2011   PLT 291 08/10/2011      Chemistry      Component Value Date/Time   NA 140 12/19/2010 1617   NA  139 04/08/2010 1455   K 4.3 12/19/2010 1617   K 4.8* 04/08/2010 1455   CL 106 12/19/2010 1617   CL 104 04/08/2010 1455   CO2 24 12/19/2010 1617   CO2 28 04/08/2010 1455   BUN 25* 12/19/2010 1617   BUN 32* 04/08/2010 1455   CREATININE 1.06 12/19/2010 1617   CREATININE 1.15 08/06/2008 1434      Component Value Date/Time   CALCIUM 9.1 12/19/2010 1617   CALCIUM 9.0 04/08/2010 1455   ALKPHOS 78 04/08/2010 1455   ALKPHOS 76 11/04/2009 1338   AST 27 04/08/2010 1455   AST 21 11/04/2009 1338   ALT 14 11/04/2009 1338   BILITOT 0.40 04/08/2010 1455   BILITOT 0.8 11/04/2009  1338       RADIOGRAPHIC STUDIES:  No results found.  ASSESSMENT: 78 year old with 1. Breast cancer  2. Hemochromatosis her ferritin looks good today will hold phlebotomy    PLAN:  1. Follow up in 3 months with cbc and iron studies    All questions were answered. The patient knows to call the clinic with any problems, questions or concerns. We can certainly see the patient much sooner if necessary.  I spent 20 minutes counseling the patient face to face. The total time spent in the appointment was 30 minutes.    Drue Second, MD Medical/Oncology [facility] [phone] (beeper) [phone] (Office)  08/13/2011, 12:33 PM

## 2011-09-06 ENCOUNTER — Encounter: Payer: Self-pay | Admitting: Family Medicine

## 2011-09-06 ENCOUNTER — Ambulatory Visit (INDEPENDENT_AMBULATORY_CARE_PROVIDER_SITE_OTHER): Payer: Medicare Other | Admitting: Family Medicine

## 2011-09-06 DIAGNOSIS — R079 Chest pain, unspecified: Secondary | ICD-10-CM

## 2011-09-06 DIAGNOSIS — I10 Essential (primary) hypertension: Secondary | ICD-10-CM | POA: Diagnosis not present

## 2011-09-06 DIAGNOSIS — M810 Age-related osteoporosis without current pathological fracture: Secondary | ICD-10-CM | POA: Diagnosis not present

## 2011-09-06 DIAGNOSIS — K219 Gastro-esophageal reflux disease without esophagitis: Secondary | ICD-10-CM

## 2011-09-06 MED ORDER — AMLODIPINE BESYLATE 5 MG PO TABS: ORAL_TABLET | ORAL | Status: DC

## 2011-09-06 NOTE — Patient Instructions (Signed)
Increase amlodipine to 1.5 tablets (7.5 mg) every day F/u with Dr. Susann Givens in 2 weeks on your blood pressure. Check BP and pulse regularly, keep list, and bring list to your appointment.  Bring machine to visit to have accuracy checked if you wish.  Heartburn-- Cut back on caffeine, chocolate. If you are having ongoing symptoms, you can stop the fosamax for a few weeks. Discuss with Dr. Susann Givens at follow-up whether or not it is okay to restart You may use OTC acid reducters like Maalox or Mylanta when you develop symptoms.  If having daily symptoms, zantac or prilosec might work better, but not to be taken longterm without re-evaluation by MD  Diet for GERD or PUD Nutrition therapy can help ease the discomfort of gastroesophageal reflux disease (GERD) and peptic ulcer disease (PUD).  HOME CARE INSTRUCTIONS   Eat your meals slowly, in a relaxed setting.   Eat 5 to 6 small meals per day.   If a food causes distress, stop eating it for a period of time.  FOODS TO AVOID  Coffee, regular or decaffeinated.   Cola beverages, regular or low calorie.   Tea, regular or decaffeinated.   Pepper.   Cocoa.   High fat foods, including meats.   Butter, margarine, hydrogenated oil (trans fats).   Peppermint or spearmint (if you have GERD).   Fruits and vegetables if not tolerated.   Alcohol.   Nicotine (smoking or chewing). This is one of the most potent stimulants to acid production in the gastrointestinal tract.   Any food that seems to aggravate your condition.  If you have questions regarding your diet, ask your caregiver or a registered dietitian. TIPS  Lying flat may make symptoms worse. Keep the head of your bed raised 6 to 9 inches (15 to 23 cm) by using a foam wedge or blocks under the legs of the bed.   Do not lay down until 3 hours after eating a meal.   Daily physical activity may help reduce symptoms.  MAKE SURE YOU:   Understand these instructions.   Will watch your  condition.   Will get help right away if you are not doing well or get worse.  Document Released: 06/26/2005 Document Revised: 03/08/2011 Document Reviewed: 11/09/2008 [facility] Patient Information 2012 [place], [place].

## 2011-09-06 NOTE — Progress Notes (Signed)
Chief complaint: Gastrophageal Reflux woke up with indigestion this am, took 2 baby aspirin at 5:30 am and it went away after 45 minutes. Also happened about 2 weeks ago, one time. BP this am was 191/72, overall patient just feels "dragged down."  HPI: Has been feeling like she needs to burp, off and on since first episode a couple of weeks ago.  Does water aerobics once a week without problems--no DOE or chest pain, or worsening of heartburn.  She has been on fosamax for several years.  Denies dysphagia. Takes pill on Saturdays.  Awoke this morning with the heartburn.  Last episode wasn't related to taking Fosamax, not occuring within a day or so of taking medication.  HTN--BP's had been running the 150's-160's, last checked 2 weeks ago.  Past Medical History  Diagnosis Date  . Arthritis   . Hemorrhoid   . Cataract   . Cancer     Breast  . Hypertension   . Hemochromatosis     Followed by  hematology;  . Osteoporosis   . Colon polyp     Past Surgical History  Procedure Date  . Colonoscopy 2009  . Total knee arthroplasty 2006  . Dilation and curettage of uterus 2005    History   Social History  . Marital Status: Married    Spouse Name: N/A    Number of Children: N/A  . Years of Education: N/A   Occupational History  . Not on file.   Social History Main Topics  . Smoking status: Never Smoker   . Smokeless tobacco: Never Used  . Alcohol Use: No  . Drug Use: No  . Sexually Active: Not on file   Other Topics Concern  . Not on file   Social History Narrative  . No narrative on file    Family History  Problem Relation Age of Onset  . Cancer Mother 54    GYN cancer (had hysterectomy)  . Tuberculosis Father   . Asthma Sister   . Asthma Brother   . Heart disease Brother     rheumatic    Current outpatient prescriptions:alendronate (FOSAMAX) 70 MG tablet, Take 70 mg by mouth every 7 (seven) days. Take with a full glass of water on an empty stomach. , Disp: ,  Rfl: ;  amLODipine (NORVASC) 5 MG tablet, Take 1 tablet (5 mg total) by mouth daily., Disp: 90 tablet, Rfl: 3;  Cholecalciferol (VITAMIN D) 1000 UNITS capsule, Take 1,000 Units by mouth every other day. , Disp: , Rfl:  losartan-hydrochlorothiazide (HYZAAR) 100-25 MG per tablet, Take 1 tablet by mouth daily., Disp: 90 tablet, Rfl: 4;  Prednicarbate 0.1 % CREA, Prednicarbate 0.1% topical cream # 60. Apply to affected area 2 times a day as directed., Disp: 60 g, Rfl: 0;  QUININE SULFATE PO, Take 3 tablets by mouth as needed. , Disp: , Rfl: ;  acetaminophen (TYLENOL) 325 MG tablet, Take 650 mg by mouth as needed.  , Disp: , Rfl:   No Known Allergies  ROS: Denies headaches, dizziness, nausea, vomiting, diarrhea, constipation, any bleeding. Denies fevers, skin rash, bleeding, or other concerns.  See HPI  PHYSICAL EXAM: BP 162/76  Pulse 80  Ht 5\' 6"  (1.676 m)  Wt 206 lb (93.441 kg)  BMI 33.25 kg/m2 168/70 by MD Well developed, pleasant, elderly female in no distress Neck: no lymphadenopathy or mass Heart: regular rate and rhythm without murmur Lungs: clear bilaterally Abdomen: soft, nontender, no organomegaly or mass.  No epigastric tenderness Extremities:  no significant edema Skin: no rash Psych: normal mood, affect, hygiene and grooming  ASSESSMENT/PLAN: 1. Chest pain  PR ELECTROCARDIOGRAM, COMPLETE  2. GERD (gastroesophageal reflux disease)    3. Hypertension    4. Osteoporosis     EKG:  Normal sinus rhythm with occasional PVC.  Somewhat poor/delayed R-wave progression, which is somewhat different compared to EKG in 2008 (where RWP was normal).  No acute changes  HTN--suboptimally controlled. Increase amlodipine to 1.5 tablets (7.5 mg) F/u with Dr. Susann Givens in 2 weeks for BP f/u Check BP and pulse regularly, keep list, and bring list to your appointment.  Bring machine to visit to have accuracy checked if you wish.  Heartburn--handout given.  Cut back on caffeine, chocolate. If you are  having ongoing symptoms, you can stop the fosamax for a few weeks. Discuss with Dr. Susann Givens at follow-up whether or not it is okay to restart.  Reviewed OTC meds to use prn  F/u 2 weeks with Dr. Susann Givens on HTN, GERD, and whether or not she should retry/restart fosamax

## 2011-09-15 ENCOUNTER — Encounter: Payer: Self-pay | Admitting: Internal Medicine

## 2011-09-21 ENCOUNTER — Ambulatory Visit: Payer: Medicare Other | Admitting: Family Medicine

## 2011-09-27 ENCOUNTER — Encounter: Payer: Self-pay | Admitting: Family Medicine

## 2011-09-27 ENCOUNTER — Ambulatory Visit (INDEPENDENT_AMBULATORY_CARE_PROVIDER_SITE_OTHER): Payer: Medicare Other | Admitting: Family Medicine

## 2011-09-27 VITALS — BP 138/58 | HR 85 | Wt 202.0 lb

## 2011-09-27 DIAGNOSIS — I10 Essential (primary) hypertension: Secondary | ICD-10-CM | POA: Diagnosis not present

## 2011-09-27 DIAGNOSIS — Z79899 Other long term (current) drug therapy: Secondary | ICD-10-CM | POA: Diagnosis not present

## 2011-09-27 MED ORDER — AMLODIPINE BESYLATE 5 MG PO TABS: ORAL_TABLET | ORAL | Status: DC

## 2011-09-27 MED ORDER — LOSARTAN POTASSIUM-HCTZ 100-25 MG PO TABS: 1.0000 | ORAL_TABLET | Freq: Every day | ORAL | Status: DC

## 2011-09-27 NOTE — Patient Instructions (Signed)
Take 1 1/2 pills a day

## 2011-09-27 NOTE — Progress Notes (Signed)
  Subjective:    Patient ID: Tammy Lloyd, female    DOB: 06/27/1924, 78 y.o.   MRN: [id number]  HPI She is here for a recheck. Presently she is on Norvasc 7-1/2 mg. She's been on this for several weeks. Review of her blood pressure indicates that it is slowly coming down.   Review of Systems     Objective:   Physical Exam  alert and in no distress. Blood pressure is recorded.      Assessment & Plan:   1. Hypertension   2. Encounter for long-term (current) use of other medications    I will continue the present dosing regimen and recheck here in one month.

## 2011-09-28 ENCOUNTER — Telehealth: Payer: Self-pay | Admitting: *Deleted

## 2011-09-28 ENCOUNTER — Encounter: Payer: Self-pay | Admitting: Oncology

## 2011-09-28 ENCOUNTER — Other Ambulatory Visit (HOSPITAL_BASED_OUTPATIENT_CLINIC_OR_DEPARTMENT_OTHER): Payer: Medicare Other | Admitting: Lab

## 2011-09-28 ENCOUNTER — Ambulatory Visit (HOSPITAL_BASED_OUTPATIENT_CLINIC_OR_DEPARTMENT_OTHER): Payer: Medicare Other | Admitting: Oncology

## 2011-09-28 VITALS — BP 154/68 | HR 76 | Temp 97.9°F | Ht 66.0 in | Wt 209.8 lb

## 2011-09-28 DIAGNOSIS — M549 Dorsalgia, unspecified: Secondary | ICD-10-CM | POA: Diagnosis not present

## 2011-09-28 DIAGNOSIS — Z853 Personal history of malignant neoplasm of breast: Secondary | ICD-10-CM

## 2011-09-28 HISTORY — DX: Hemochromatosis, unspecified: E83.119

## 2011-09-28 LAB — CBC WITH DIFFERENTIAL/PLATELET
EOS%: 3.6 % (ref 0.0–7.0)
MCH: 32.6 pg (ref 25.1–34.0)
MCHC: 33.9 g/dL (ref 31.5–36.0)
MCV: 96.2 fL (ref 79.5–101.0)
MONO%: 8.8 % (ref 0.0–14.0)
NEUT#: 5.6 10*3/uL (ref 1.5–6.5)
RBC: 3.65 10*6/uL — ABNORMAL LOW (ref 3.70–5.45)
RDW: 12.6 % (ref 11.2–14.5)

## 2011-09-28 LAB — FERRITIN: Ferritin: 333 ng/mL — ABNORMAL HIGH (ref 10–291)

## 2011-09-28 LAB — IRON AND TIBC: Iron: 163 ug/dL — ABNORMAL HIGH (ref 42–145)

## 2011-09-28 NOTE — Telephone Encounter (Signed)
gave patient appointment for 12-15-2011 starting at 9:00am printed out calendar and gave to the patient sent email to [facility] to add on an phel

## 2011-09-28 NOTE — Patient Instructions (Signed)
1. You have a follow up appointment in 3 months.  2. We will check iron and cbc at the next visit  3. We will tentatively set you up for a 1 unit of phlebotomy  4. Call with any problems

## 2011-09-28 NOTE — Progress Notes (Signed)
OFFICE PROGRESS NOTE  CC [name], MD  [name], MD, MD [address] [place] [address]  DIAGNOSIS: 78 year old with  1. Right breast cancer diagnosed November 2000  2. hemochromastosis diagnosed 2008 on phlebotomies to keep ferritin below 500  3. Osteoarthritis and chronic low back pain  4. Hypertension  5. Osteoporosis/osteopenia  CURRENT THERAPY:phlebotomies as needed  INTERVAL HISTORY: Tammy Lloyd 78 y.o. female returns for follow up visit. Overall she seems to be doing well. She continues to have pain in the back.He has no fevers or chills, no nausea or vomiting. She does have some difficulty walking and uses a cane. No bleeding or tingling or numbness. Remainder of the 10 point review of systems is negative.  MEDICAL HISTORY: Past Medical History  Diagnosis Date  . Arthritis   . Hemorrhoid   . Cataract   . Cancer     Breast  . Hypertension   . Hemochromatosis     Followed by  hematology;  . Osteoporosis   . Colon polyp   . Idiopathic hemochromatosis 09/28/2011    ALLERGIES:   has no known allergies.  MEDICATIONS:  Current Outpatient Prescriptions  Medication Sig Dispense Refill  . acetaminophen (TYLENOL) 325 MG tablet Take 650 mg by mouth as needed.        Marland Kitchen amLODipine (NORVASC) 5 MG tablet Take 1.5 tablets once daily  135 tablet  3  . Cholecalciferol (VITAMIN D) 1000 UNITS capsule Take 1,000 Units by mouth every other day.       . losartan-hydrochlorothiazide (HYZAAR) 100-25 MG per tablet Take 1 tablet by mouth daily.  90 tablet  4  . Prednicarbate 0.1 % CREA Prednicarbate 0.1% topical cream # 60. Apply to affected area 2 times a day as directed.  60 g  0  . QUININE SULFATE PO Take 3 tablets by mouth as needed.       Marland Kitchen alendronate (FOSAMAX) 70 MG tablet Take 70 mg by mouth every 7 (seven) days. Take with a full glass of water on an empty stomach.         SURGICAL HISTORY:  Past Surgical History  Procedure Date  .  Colonoscopy 2009  . Total knee arthroplasty 2006  . Dilation and curettage of uterus 2005    REVIEW OF SYSTEMS:  Pertinent items are noted in HPI.   PHYSICAL EXAMINATION: General appearance: alert, cooperative and appears stated age Head: Normocephalic, without obvious abnormality, atraumatic Neck: no adenopathy, no carotid bruit, no JVD, supple, symmetrical, trachea midline and thyroid not enlarged, symmetric, no tenderness/mass/nodules Lymph nodes: Cervical, supraclavicular, and axillary nodes normal. Resp: clear to auscultation bilaterally and normal percussion bilaterally Back: symmetric, no curvature. ROM normal. No CVA tenderness. Cardio: regular rate and rhythm, S1, S2 normal, no murmur, click, rub or gallop GI: soft, non-tender; bowel sounds normal; no masses,  no organomegaly Extremities: edema Neurologic: Alert and oriented X 3, normal strength and tone. Normal symmetric reflexes. Normal coordination and gait  ECOG PERFORMANCE STATUS: 1 - Symptomatic but completely ambulatory  Blood pressure 154/68, pulse 76, temperature 97.9 F (36.6 C), temperature source Oral, height 5\' 6"  (1.676 m), weight 209 lb 12.8 oz (95.165 kg).  LABORATORY DATA: Lab Results  Component Value Date   WBC 11.0* 09/28/2011   HGB 11.9 09/28/2011   HCT 35.1 09/28/2011   MCV 96.2 09/28/2011   PLT 309 09/28/2011      Chemistry      Component Value Date/Time   NA 140 12/19/2010  1617   NA 139 04/08/2010 1455   K 4.3 12/19/2010 1617   K 4.8* 04/08/2010 1455   CL 106 12/19/2010 1617   CL 104 04/08/2010 1455   CO2 24 12/19/2010 1617   CO2 28 04/08/2010 1455   BUN 25* 12/19/2010 1617   BUN 32* 04/08/2010 1455   CREATININE 1.06 12/19/2010 1617   CREATININE 1.15 08/06/2008 1434      Component Value Date/Time   CALCIUM 9.1 12/19/2010 1617   CALCIUM 9.0 04/08/2010 1455   ALKPHOS 78 04/08/2010 1455   ALKPHOS 76 11/04/2009 1338   AST 27 04/08/2010 1455   AST 21 11/04/2009 1338   ALT 14 11/04/2009 1338   BILITOT 0.40  04/08/2010 1455   BILITOT 0.8 11/04/2009 1338       RADIOGRAPHIC STUDIES:  No results found.  ASSESSMENT: 78 year old with 1. Breast cancer  2. Hemochromatosis will hold phlebotomy since her ferritin is under 500   PLAN:  1. Follow up in 3 months with cbc and iron studies    All questions were answered. The patient knows to call the clinic with any problems, questions or concerns. We can certainly see the patient much sooner if necessary.  I spent 20 minutes counseling the patient face to face. The total time spent in the appointment was 30 minutes.    Drue Second, MD Medical/Oncology [facility] [phone] (beeper) [phone] (Office)  09/28/2011, 6:13 PM

## 2011-09-28 NOTE — Telephone Encounter (Signed)
gave patient appointment for 12-15-2011 printed out calendar and gave to the patient

## 2011-10-23 ENCOUNTER — Inpatient Hospital Stay (HOSPITAL_COMMUNITY)
Admission: EM | Admit: 2011-10-23 | Discharge: 2011-10-25 | DRG: 287 | Disposition: A | Discharge status: Home / Self Care | Payer: Medicare Other | Attending: Cardiology | Admitting: Cardiology

## 2011-10-23 ENCOUNTER — Emergency Department (HOSPITAL_COMMUNITY): Payer: Medicare Other

## 2011-10-23 ENCOUNTER — Encounter (HOSPITAL_COMMUNITY): Payer: Self-pay | Admitting: *Deleted

## 2011-10-23 DIAGNOSIS — Z96659 Presence of unspecified artificial knee joint: Secondary | ICD-10-CM

## 2011-10-23 DIAGNOSIS — I2510 Atherosclerotic heart disease of native coronary artery without angina pectoris: Principal | ICD-10-CM

## 2011-10-23 DIAGNOSIS — Z860100 Personal history of colon polyps, unspecified: Secondary | ICD-10-CM

## 2011-10-23 DIAGNOSIS — I2789 Other specified pulmonary heart diseases: Secondary | ICD-10-CM | POA: Diagnosis present

## 2011-10-23 DIAGNOSIS — Z79899 Other long term (current) drug therapy: Secondary | ICD-10-CM

## 2011-10-23 DIAGNOSIS — E7800 Pure hypercholesterolemia, unspecified: Secondary | ICD-10-CM | POA: Diagnosis present

## 2011-10-23 DIAGNOSIS — I200 Unstable angina: Secondary | ICD-10-CM | POA: Diagnosis present

## 2011-10-23 DIAGNOSIS — R079 Chest pain, unspecified: Secondary | ICD-10-CM | POA: Diagnosis not present

## 2011-10-23 DIAGNOSIS — J9819 Other pulmonary collapse: Secondary | ICD-10-CM | POA: Diagnosis not present

## 2011-10-23 DIAGNOSIS — Z853 Personal history of malignant neoplasm of breast: Secondary | ICD-10-CM

## 2011-10-23 DIAGNOSIS — R0789 Other chest pain: Secondary | ICD-10-CM | POA: Diagnosis not present

## 2011-10-23 DIAGNOSIS — Z6832 Body mass index (BMI) 32.0-32.9, adult: Secondary | ICD-10-CM

## 2011-10-23 DIAGNOSIS — M1990 Unspecified osteoarthritis, unspecified site: Secondary | ICD-10-CM | POA: Diagnosis present

## 2011-10-23 DIAGNOSIS — Z9849 Cataract extraction status, unspecified eye: Secondary | ICD-10-CM

## 2011-10-23 DIAGNOSIS — I10 Essential (primary) hypertension: Secondary | ICD-10-CM | POA: Diagnosis present

## 2011-10-23 DIAGNOSIS — M810 Age-related osteoporosis without current pathological fracture: Secondary | ICD-10-CM | POA: Diagnosis present

## 2011-10-23 DIAGNOSIS — K219 Gastro-esophageal reflux disease without esophagitis: Secondary | ICD-10-CM | POA: Diagnosis present

## 2011-10-23 DIAGNOSIS — Z7982 Long term (current) use of aspirin: Secondary | ICD-10-CM

## 2011-10-23 LAB — COMPREHENSIVE METABOLIC PANEL
ALT: 13 U/L (ref 0–35)
CO2: 24 mEq/L (ref 19–32)
Calcium: 9.8 mg/dL (ref 8.4–10.5)
Chloride: 101 mEq/L (ref 96–112)
Creatinine, Ser: 1.09 mg/dL (ref 0.50–1.10)
Glucose, Bld: 107 mg/dL — ABNORMAL HIGH (ref 70–99)

## 2011-10-23 LAB — TROPONIN I: Troponin I: <0.3 ng/mL (ref <0.30)

## 2011-10-23 LAB — CBC
HCT: 35.8 % — ABNORMAL LOW (ref 36.0–46.0)
Hemoglobin: 12.2 g/dL (ref 12.0–15.0)
MCV: 97.3 fL (ref 78.0–100.0)
RBC: 3.68 MIL/uL — ABNORMAL LOW (ref 3.87–5.11)
WBC: 8.7 10*3/uL (ref 4.0–10.5)

## 2011-10-23 LAB — DIFFERENTIAL
Eosinophils Relative: 2 % (ref 0–5)
Lymphocytes Relative: 27 % (ref 12–46)
Lymphs Abs: 2.4 10*3/uL (ref 0.7–4.0)
Monocytes Absolute: 0.7 10*3/uL (ref 0.1–1.0)

## 2011-10-23 LAB — CK TOTAL AND CKMB (NOT AT ARMC): Total CK: 79 U/L (ref 7–177)

## 2011-10-23 LAB — PROTIME-INR
INR: 1.03 (ref 0.00–1.49)
Prothrombin Time: 13.7 seconds (ref 11.6–15.2)

## 2011-10-23 MED ORDER — ASPIRIN 81 MG PO CHEW
324.0000 mg | CHEWABLE_TABLET | Freq: Once | ORAL | Status: AC
  Administered 2011-10-23: 324 mg via ORAL
  Filled 2011-10-23: qty 4

## 2011-10-23 NOTE — ED Provider Notes (Signed)
History     CSN: [id number]  Arrival date & time 10/23/11  1725   First MD Initiated Contact with Patient 10/23/11 2008      Chief Complaint  Patient presents with  . Chest Pain    (Consider location/radiation/quality/duration/timing/severity/associated sxs/prior treatment) Patient is a 78 y.o. female presenting with chest pain. The history is provided by the patient. No language interpreter was used.  Chest Pain The chest pain began 12 - 24 hours ago. Chest pain occurs intermittently. The chest pain is unchanged. Associated with: no specific association. The severity of the pain is mild. The quality of the pain is described as pressure-like and burning. The pain does not radiate. Exacerbated by: no specific exacerbating or alleviating measures. Pertinent negatives for primary symptoms include no fever, no fatigue, no syncope, no shortness of breath, no cough, no palpitations, no abdominal pain, no nausea, no vomiting and no dizziness.  Pertinent negatives for associated symptoms include no diaphoresis, no lower extremity edema, no near-syncope, no numbness, no orthopnea, no paroxysmal nocturnal dyspnea and no weakness. She tried aspirin for the symptoms.  Her past medical history is significant for hypertension.     Past Medical History  Diagnosis Date  . Arthritis   . Hemorrhoid   . Cataract   . Cancer     Breast  . Hypertension   . Hemochromatosis     Followed by  hematology;  . Osteoporosis   . Colon polyp   . Idiopathic hemochromatosis 09/28/2011  . Pulmonary hypertension   . Edema of both legs     Past Surgical History  Procedure Date  . Colonoscopy 2009  . Total knee arthroplasty 2006  . Dilation and curettage of uterus 2005  . Cataract extraction, bilateral   . Breast lumpectomy     Right    Family History  Problem Relation Age of Onset  . Cancer Mother 28    GYN cancer (had hysterectomy)  . Tuberculosis Father   . Asthma Sister   . Asthma Brother   .  Heart disease Brother     rheumatic    History  Substance Use Topics  . Smoking status: Never Smoker   . Smokeless tobacco: Never Used  . Alcohol Use: No    OB History    Grav Para Term Preterm Abortions TAB SAB Ect Mult Living                  Review of Systems  Constitutional: Negative for fever, chills, diaphoresis, activity change, appetite change and fatigue.  HENT: Negative for congestion, sore throat, rhinorrhea, neck pain and neck stiffness.   Respiratory: Negative for cough and shortness of breath.   Cardiovascular: Positive for chest pain. Negative for palpitations, orthopnea, syncope and near-syncope.  Gastrointestinal: Negative for nausea, vomiting and abdominal pain.  Genitourinary: Negative for dysuria, urgency, frequency and flank pain.  Neurological: Negative for dizziness, weakness, light-headedness, numbness and headaches.  All other systems reviewed and are negative.    Allergies  Review of patient's allergies indicates no known allergies.  Home Medications   Current Outpatient Rx  Name Route Sig Dispense Refill  . ACETAMINOPHEN 325 MG PO TABS Oral Take 650 mg by mouth every 6 (six) hours as needed. For pain    . AMLODIPINE BESYLATE 5 MG PO TABS Oral Take 7.5 mg by mouth daily.    Marland Kitchen VITAMIN D 1000 UNITS PO CAPS Oral Take 1,000 Units by mouth every other day.     Marland Kitchen LOSARTAN  POTASSIUM-HCTZ 100-25 MG PO TABS Oral Take 1 tablet by mouth daily. 90 tablet 4  . PREDNICARBATE 0.1 % EX CREA Topical Apply 1 application topically 2 (two) times daily as needed. For itching    . QUININE SULFATE PO Oral Take 3 tablets by mouth as needed. For leg cramps    . ALENDRONATE SODIUM 70 MG PO TABS Oral Take 70 mg by mouth every 7 (seven) days. On Saturday.   Take with a full glass of water on an empty stomach.      BP 176/86  Pulse 87  Temp(Src) 98.4 F (36.9 C) (Oral)  Resp 13  SpO2 99%  Physical Exam  Nursing note and vitals reviewed. Constitutional: She is  oriented to person, place, and time. She appears well-developed and well-nourished.  HENT:  Head: Normocephalic and atraumatic.  Mouth/Throat: Oropharynx is clear and moist.  Eyes: Conjunctivae and EOM are normal. Pupils are equal, round, and reactive to light.  Neck: Normal range of motion. Neck supple.  Cardiovascular: Normal rate, regular rhythm, normal heart sounds and intact distal pulses.  Exam reveals no gallop and no friction rub.   No murmur heard. Pulmonary/Chest: Effort normal and breath sounds normal. No respiratory distress. She exhibits no tenderness.  Abdominal: Soft. Bowel sounds are normal. There is no tenderness. There is no rebound and no guarding.  Musculoskeletal: Normal range of motion. She exhibits no edema and no tenderness.  Neurological: She is alert and oriented to person, place, and time.  Skin: Skin is warm and dry.    ED Course  Procedures (including critical care time)   Date: 10/23/2011  Rate: 96  Rhythm: normal sinus rhythm  QRS Axis: normal  Intervals: normal  ST/T Wave abnormalities: normal  Conduction Disutrbances:none  Narrative Interpretation:   Old EKG Reviewed: unchanged  Labs Reviewed  CBC - Abnormal; Notable for the following:    RBC 3.68 (*)    HCT 35.8 (*)    All other components within normal limits  COMPREHENSIVE METABOLIC PANEL - Abnormal; Notable for the following:    Glucose, Bld 107 (*)    BUN 29 (*)    GFR calc non Af Amer 45 (*)    GFR calc Af Amer 52 (*)    All other components within normal limits  DIFFERENTIAL  CK TOTAL AND CKMB  TROPONIN I  PROTIME-INR   Dg Chest 2 View  10/23/2011  *RADIOLOGY REPORT*  Clinical Data: Chest pain.  CHEST - 2 VIEW  Comparison: PA and lateral chest 10/05/2006.  Findings: Lung volumes are low than on the comparison study with basilar atelectasis.  Heart size is upper normal.  No pneumothorax or effusion.  Surgical clips in the right axilla are seen.  IMPRESSION: No acute abnormality.   Original Report Authenticated By: Bernadene Bell. D'ALESSIO, M.D.     1. Chest pain       MDM  Chest pain with concerning history. She has no prior cardiology visits. Discussed the case with Dr. Sharyn Lull who will limit the patient for observation and serial cardiac markers. Troponin is negative. EKG unremarkable. Chest pain is resolved. Chest x-ray unremarkable. Received aspirin in the emergency department. Additional etiologies of chest pain considered. GERD is a possibility. Pulmonary embolus extremely unlikely.        Dayton Bailiff, MD 10/23/11 2231

## 2011-10-23 NOTE — H&P (Signed)
Tammy Lloyd is an 78 y.o. female.   Chief Complaint: Chest pain HPI: Patient is 78 year old female with past medical history significant for hypertension hemachromatosis history of CVA of breast pulmonary hypertension degenerative joint disease came to the ER complaining of recurrent retrosternal burning/pressure chest pain since yesterday morning 3 AM which woke her up she talks TUMS and aspirin with partial relief went back to bed woke up around 5:30 AM had recurrent chest pain off and on all day grade 3/10. Denies any nausea vomiting diaphoresis denies shortness of breath denies palpitation lightheadedness or syncope he did patient denies any recent cardiac workup.  Past Medical History  Diagnosis Date  . Arthritis   . Hemorrhoid   . Cataract   . Cancer     Breast  . Hypertension   . Hemochromatosis     Followed by  hematology;  . Osteoporosis   . Colon polyp   . Idiopathic hemochromatosis 09/28/2011  . Pulmonary hypertension   . Edema of both legs     Past Surgical History  Procedure Date  . Colonoscopy 2009  . Total knee arthroplasty 2006  . Dilation and curettage of uterus 2005  . Cataract extraction, bilateral   . Breast lumpectomy     Right    Family History  Problem Relation Age of Onset  . Cancer Mother 13    GYN cancer (had hysterectomy)  . Tuberculosis Father   . Asthma Sister   . Asthma Brother   . Heart disease Brother     rheumatic   Social History:  reports that she has never smoked. She has never used smokeless tobacco. She reports that she does not drink alcohol or use illicit drugs.  Allergies: No Known Allergies  Medications Prior to Admission  Medication Dose Route Frequency Provider Last Rate Last Dose  . aspirin chewable tablet 324 mg  324 mg Oral Once Dayton Bailiff, MD   324 mg at 10/23/11 2052   Medications Prior to Admission  Medication Sig Dispense Refill  . acetaminophen (TYLENOL) 325 MG tablet Take 650 mg by mouth every 6 (six) hours as  needed. For pain      . Cholecalciferol (VITAMIN D) 1000 UNITS capsule Take 1,000 Units by mouth every other day.       . losartan-hydrochlorothiazide (HYZAAR) 100-25 MG per tablet Take 1 tablet by mouth daily.  90 tablet  4  . Prednicarbate 0.1 % CREA Apply 1 application topically 2 (two) times daily as needed. For itching      . QUININE SULFATE PO Take 3 tablets by mouth as needed. For leg cramps      . alendronate (FOSAMAX) 70 MG tablet Take 70 mg by mouth every 7 (seven) days. On Saturday.   Take with a full glass of water on an empty stomach.        Results for orders placed during the hospital encounter of 10/23/11 (from the past 48 hour(s))  CBC     Status: Abnormal   Collection Time   10/23/11  6:53 PM      Component Value Range Comment   WBC 8.7  4.0 - 10.5 (K/uL)    RBC 3.68 (*) 3.87 - 5.11 (MIL/uL)    Hemoglobin 12.2  12.0 - 15.0 (g/dL)    HCT 16.1 (*) 09.6 - 46.0 (%)    MCV 97.3  78.0 - 100.0 (fL)    MCH 33.2  26.0 - 34.0 (pg)    MCHC 34.1  30.0 -  36.0 (g/dL)    RDW 54.0  98.1 - 19.1 (%)    Platelets 355  150 - 400 (K/uL)   DIFFERENTIAL     Status: Normal   Collection Time   10/23/11  6:53 PM      Component Value Range Comment   Neutrophils Relative 63  43 - 77 (%)    Neutro Abs 5.5  1.7 - 7.7 (K/uL)    Lymphocytes Relative 27  12 - 46 (%)    Lymphs Abs 2.4  0.7 - 4.0 (K/uL)    Monocytes Relative 8  3 - 12 (%)    Monocytes Absolute 0.7  0.1 - 1.0 (K/uL)    Eosinophils Relative 2  0 - 5 (%)    Eosinophils Absolute 0.1  0.0 - 0.7 (K/uL)    Basophils Relative 1  0 - 1 (%)    Basophils Absolute 0.1  0.0 - 0.1 (K/uL)   COMPREHENSIVE METABOLIC PANEL     Status: Abnormal   Collection Time   10/23/11  6:53 PM      Component Value Range Comment   Sodium 139  135 - 145 (mEq/L)    Potassium 4.0  3.5 - 5.1 (mEq/L)    Chloride 101  96 - 112 (mEq/L)    CO2 24  19 - 32 (mEq/L)    Glucose, Bld 107 (*) 70 - 99 (mg/dL)    BUN 29 (*) 6 - 23 (mg/dL)    Creatinine, Ser 4.78  0.50 -  1.10 (mg/dL)    Calcium 9.8  8.4 - 10.5 (mg/dL)    Total Protein 7.6  6.0 - 8.3 (g/dL)    Albumin 4.4  3.5 - 5.2 (g/dL)    AST 21  0 - 37 (U/L)    ALT 13  0 - 35 (U/L)    Alkaline Phosphatase 83  39 - 117 (U/L)    Total Bilirubin 0.3  0.3 - 1.2 (mg/dL)    GFR calc non Af Amer 45 (*) >90 (mL/min)    GFR calc Af Amer 52 (*) >90 (mL/min)   CK TOTAL AND CKMB     Status: Normal   Collection Time   10/23/11  6:56 PM      Component Value Range Comment   Total CK 79  7 - 177 (U/L)    CK, MB 3.1  0.3 - 4.0 (ng/mL)    Relative Index RELATIVE INDEX IS INVALID  0.0 - 2.5    TROPONIN I     Status: Normal   Collection Time   10/23/11  8:21 PM      Component Value Range Comment   Troponin I <0.30  <0.30 (ng/mL)   PROTIME-INR     Status: Normal   Collection Time   10/23/11  8:21 PM      Component Value Range Comment   Prothrombin Time 13.7  11.6 - 15.2 (seconds)    INR 1.03  0.00 - 1.49     Dg Chest 2 View  10/23/2011  *RADIOLOGY REPORT*  Clinical Data: Chest pain.  CHEST - 2 VIEW  Comparison: PA and lateral chest 10/05/2006.  Findings: Lung volumes are low than on the comparison study with basilar atelectasis.  Heart size is upper normal.  No pneumothorax or effusion.  Surgical clips in the right axilla are seen.  IMPRESSION: No acute abnormality.  Original Report Authenticated By: Bernadene Bell. Maricela Curet, M.D.    Review of Systems  Constitutional: Negative for fever, chills, weight loss and  diaphoresis.  HENT: Negative for hearing loss.   Eyes: Negative for blurred vision and double vision.  Respiratory: Negative for cough, hemoptysis and sputum production.   Cardiovascular: Positive for chest pain. Negative for palpitations, orthopnea and claudication.  Gastrointestinal: Negative for heartburn, nausea, vomiting and abdominal pain.  Genitourinary: Negative for dysuria.  Neurological: Negative for dizziness, speech change, focal weakness and headaches.    Blood pressure 176/86, pulse 87,  temperature 98.4 F (36.9 C), temperature source Oral, resp. rate 13, SpO2 99.00%. Physical Exam  Constitutional: She is oriented to person, place, and time. She appears well-developed and well-nourished. No distress.  HENT:  Head: Normocephalic and atraumatic.  Eyes: Conjunctivae are normal. Left eye exhibits no discharge. No scleral icterus.  Neck: Normal range of motion. Neck supple. No JVD present. No tracheal deviation present. No thyromegaly present.  Cardiovascular: Normal rate, regular rhythm and normal heart sounds.  Exam reveals no gallop and no friction rub.   No murmur heard. Respiratory: Effort normal and breath sounds normal. No respiratory distress. She has no wheezes. She has no rales.  GI: Soft. Bowel sounds are normal. She exhibits no distension. There is no tenderness. There is no rebound.  Musculoskeletal: She exhibits no edema and no tenderness.  Neurological: She is alert and oriented to person, place, and time.     Assessment/Plan Unstable angina rule out MI Hypertension History of hemachromatosis History of CA of breast Degenerative joint disease Plan Rule out MI protocol Check serial enzymes and EKG Start IV heparin nitrates aspirin beta blockers statin Discussed with patient regarding noninvasive stress testing versus left cath possible PTCA stenting its risk and benefits i.e. death MI stroke need for emergency CABG local vascular complications etc. and consented for PCI  [facility] 10/23/2011, 10:59 PM

## 2011-10-23 NOTE — ED Notes (Signed)
Patient transported to X-ray 

## 2011-10-23 NOTE — ED Notes (Signed)
The pt has had chest pain mid since last pm and all day the pain has been intermittent.

## 2011-10-23 NOTE — ED Notes (Signed)
This RN with 2 IV attempts in the left arm, unsuccessful, left FA and LeftAC, second RN in to attempt a start

## 2011-10-24 ENCOUNTER — Encounter (HOSPITAL_COMMUNITY)
Admission: EM | Disposition: A | Discharge status: Home / Self Care | Payer: Self-pay | Source: Home / Self Care | Attending: Cardiology

## 2011-10-24 ENCOUNTER — Other Ambulatory Visit: Payer: Self-pay

## 2011-10-24 DIAGNOSIS — K219 Gastro-esophageal reflux disease without esophagitis: Secondary | ICD-10-CM | POA: Diagnosis present

## 2011-10-24 DIAGNOSIS — I200 Unstable angina: Secondary | ICD-10-CM | POA: Diagnosis present

## 2011-10-24 DIAGNOSIS — Z86010 Personal history of colonic polyps: Secondary | ICD-10-CM | POA: Diagnosis not present

## 2011-10-24 DIAGNOSIS — Z6832 Body mass index (BMI) 32.0-32.9, adult: Secondary | ICD-10-CM | POA: Diagnosis not present

## 2011-10-24 DIAGNOSIS — R079 Chest pain, unspecified: Secondary | ICD-10-CM | POA: Diagnosis not present

## 2011-10-24 DIAGNOSIS — M810 Age-related osteoporosis without current pathological fracture: Secondary | ICD-10-CM | POA: Diagnosis present

## 2011-10-24 DIAGNOSIS — Z853 Personal history of malignant neoplasm of breast: Secondary | ICD-10-CM | POA: Diagnosis not present

## 2011-10-24 DIAGNOSIS — E7800 Pure hypercholesterolemia, unspecified: Secondary | ICD-10-CM | POA: Diagnosis present

## 2011-10-24 DIAGNOSIS — Z96659 Presence of unspecified artificial knee joint: Secondary | ICD-10-CM | POA: Diagnosis not present

## 2011-10-24 DIAGNOSIS — Z7982 Long term (current) use of aspirin: Secondary | ICD-10-CM | POA: Diagnosis not present

## 2011-10-24 DIAGNOSIS — Z79899 Other long term (current) drug therapy: Secondary | ICD-10-CM | POA: Diagnosis not present

## 2011-10-24 DIAGNOSIS — I2789 Other specified pulmonary heart diseases: Secondary | ICD-10-CM | POA: Diagnosis present

## 2011-10-24 DIAGNOSIS — M1990 Unspecified osteoarthritis, unspecified site: Secondary | ICD-10-CM | POA: Diagnosis present

## 2011-10-24 DIAGNOSIS — Z9849 Cataract extraction status, unspecified eye: Secondary | ICD-10-CM | POA: Diagnosis not present

## 2011-10-24 DIAGNOSIS — I10 Essential (primary) hypertension: Secondary | ICD-10-CM | POA: Diagnosis present

## 2011-10-24 DIAGNOSIS — I2510 Atherosclerotic heart disease of native coronary artery without angina pectoris: Secondary | ICD-10-CM | POA: Diagnosis present

## 2011-10-24 HISTORY — PX: LEFT HEART CATHETERIZATION WITH CORONARY ANGIOGRAM: SHX5451

## 2011-10-24 LAB — BASIC METABOLIC PANEL
BUN: 30 mg/dL — ABNORMAL HIGH (ref 6–23)
Chloride: 106 mEq/L (ref 96–112)
GFR calc Af Amer: 53 mL/min — ABNORMAL LOW (ref >90)
GFR calc non Af Amer: 46 mL/min — ABNORMAL LOW (ref >90)
Glucose, Bld: 127 mg/dL — ABNORMAL HIGH (ref 70–99)
Potassium: 3.8 mEq/L (ref 3.5–5.1)
Sodium: 140 mEq/L (ref 135–145)

## 2011-10-24 LAB — CARDIAC PANEL(CRET KIN+CKTOT+MB+TROPI)
CK, MB: 3.4 ng/mL (ref 0.3–4.0)
Relative Index: INVALID (ref 0.0–2.5)
Relative Index: 2.8 — ABNORMAL HIGH (ref 0.0–2.5)
Total CK: 79 U/L (ref 7–177)
Total CK: 107 U/L (ref 7–177)
Troponin I: <0.3 ng/mL (ref <0.30)

## 2011-10-24 LAB — COMPREHENSIVE METABOLIC PANEL
ALT: 10 U/L (ref 0–35)
AST: 17 U/L (ref 0–37)
Alkaline Phosphatase: 69 U/L (ref 39–117)
CO2: 22 mEq/L (ref 19–32)
Chloride: 107 mEq/L (ref 96–112)
GFR calc Af Amer: 52 mL/min — ABNORMAL LOW (ref >90)
GFR calc non Af Amer: 45 mL/min — ABNORMAL LOW (ref >90)
Glucose, Bld: 129 mg/dL — ABNORMAL HIGH (ref 70–99)
Potassium: 3.7 mEq/L (ref 3.5–5.1)
Sodium: 142 mEq/L (ref 135–145)
Total Bilirubin: 0.3 mg/dL (ref 0.3–1.2)

## 2011-10-24 LAB — CBC
HCT: 32.1 % — ABNORMAL LOW (ref 36.0–46.0)
Hemoglobin: 11 g/dL — ABNORMAL LOW (ref 12.0–15.0)
Hemoglobin: 11 g/dL — ABNORMAL LOW (ref 12.0–15.0)
MCH: 33.3 pg (ref 26.0–34.0)
RBC: 3.3 MIL/uL — ABNORMAL LOW (ref 3.87–5.11)
RDW: 13.2 % (ref 11.5–15.5)
WBC: 10.2 10*3/uL (ref 4.0–10.5)
WBC: 9.3 10*3/uL (ref 4.0–10.5)

## 2011-10-24 LAB — DIFFERENTIAL
Basophils Relative: 1 % (ref 0–1)
Eosinophils Relative: 5 % (ref 0–5)
Lymphocytes Relative: 34 % (ref 12–46)
Monocytes Absolute: 0.9 10*3/uL (ref 0.1–1.0)
Monocytes Relative: 10 % (ref 3–12)
Neutrophils Relative %: 50 % (ref 43–77)

## 2011-10-24 LAB — LIPID PANEL
LDL Cholesterol: 132 mg/dL — ABNORMAL HIGH (ref 0–99)
VLDL: 8 mg/dL (ref 0–40)

## 2011-10-24 LAB — GLUCOSE, CAPILLARY: Glucose-Capillary: 101 mg/dL — ABNORMAL HIGH (ref 70–99)

## 2011-10-24 LAB — PROTIME-INR: Prothrombin Time: 15.6 seconds — ABNORMAL HIGH (ref 11.6–15.2)

## 2011-10-24 SURGERY — LEFT HEART CATHETERIZATION WITH CORONARY ANGIOGRAM
Anesthesia: LOCAL

## 2011-10-24 MED ORDER — NITROGLYCERIN 0.2 MG/ML ON CALL CATH LAB
INTRAVENOUS | Status: AC
  Filled 2011-10-24: qty 1

## 2011-10-24 MED ORDER — METOPROLOL TARTRATE 25 MG PO TABS
25.0000 mg | ORAL_TABLET | Freq: Two times a day (BID) | ORAL | Status: DC
  Administered 2011-10-24 – 2011-10-25 (×3): 25 mg via ORAL
  Filled 2011-10-24 (×5): qty 1

## 2011-10-24 MED ORDER — MIDAZOLAM HCL 2 MG/2ML IJ SOLN
INTRAMUSCULAR | Status: AC
  Filled 2011-10-24: qty 2

## 2011-10-24 MED ORDER — SODIUM CHLORIDE 0.9 % IV SOLN
INTRAVENOUS | Status: AC
  Administered 2011-10-24: 14:00:00 via INTRAVENOUS

## 2011-10-24 MED ORDER — SODIUM CHLORIDE 0.9 % IV SOLN
INTRAVENOUS | Status: DC
  Administered 2011-10-24 – 2011-10-25 (×3): via INTRAVENOUS

## 2011-10-24 MED ORDER — SODIUM CHLORIDE 0.9 % IJ SOLN: 3.0000 mL | Freq: Two times a day (BID) | INTRAMUSCULAR | Status: DC

## 2011-10-24 MED ORDER — ATORVASTATIN CALCIUM 80 MG PO TABS
80.0000 mg | ORAL_TABLET | Freq: Every day | ORAL | Status: DC
  Administered 2011-10-24: 80 mg via ORAL
  Filled 2011-10-24 (×2): qty 1

## 2011-10-24 MED ORDER — SODIUM CHLORIDE 0.9 % IJ SOLN: 3.0000 mL | INTRAMUSCULAR | Status: DC | PRN

## 2011-10-24 MED ORDER — SODIUM CHLORIDE 0.9 % IV SOLN: INTRAVENOUS | Status: DC

## 2011-10-24 MED ORDER — SODIUM CHLORIDE 0.9 % IV SOLN: 250.0000 mL | INTRAVENOUS | Status: DC | PRN

## 2011-10-24 MED ORDER — HEPARIN (PORCINE) IN NACL 100-0.45 UNIT/ML-% IJ SOLN
1000.0000 [IU]/h | INTRAMUSCULAR | Status: DC
  Administered 2011-10-24: 1000 [IU]/h via INTRAVENOUS
  Filled 2011-10-24 (×2): qty 250

## 2011-10-24 MED ORDER — ACETAMINOPHEN 325 MG PO TABS: 650.0000 mg | ORAL_TABLET | ORAL | Status: DC | PRN

## 2011-10-24 MED ORDER — OXYCODONE-ACETAMINOPHEN 5-325 MG PO TABS: 1.0000 | ORAL_TABLET | ORAL | Status: DC | PRN

## 2011-10-24 MED ORDER — ONDANSETRON HCL 4 MG/2ML IJ SOLN: 4.0000 mg | Freq: Four times a day (QID) | INTRAMUSCULAR | Status: DC | PRN

## 2011-10-24 MED ORDER — ASPIRIN 300 MG RE SUPP
300.0000 mg | RECTAL | Status: AC
  Filled 2011-10-24: qty 1

## 2011-10-24 MED ORDER — HEPARIN BOLUS VIA INFUSION
4000.0000 [IU] | Freq: Once | INTRAVENOUS | Status: AC
  Administered 2011-10-24: 4000 [IU] via INTRAVENOUS
  Filled 2011-10-24: qty 4000

## 2011-10-24 MED ORDER — NITROGLYCERIN 0.4 MG SL SUBL: 0.4000 mg | SUBLINGUAL_TABLET | SUBLINGUAL | Status: DC | PRN

## 2011-10-24 MED ORDER — ALPRAZOLAM 0.25 MG PO TABS: 0.2500 mg | ORAL_TABLET | Freq: Two times a day (BID) | ORAL | Status: DC | PRN

## 2011-10-24 MED ORDER — ASPIRIN 81 MG PO CHEW: 324.0000 mg | CHEWABLE_TABLET | ORAL | Status: DC

## 2011-10-24 MED ORDER — FENTANYL CITRATE 0.05 MG/ML IJ SOLN
INTRAMUSCULAR | Status: AC
  Filled 2011-10-24: qty 2

## 2011-10-24 MED ORDER — ASPIRIN 81 MG PO CHEW: 324.0000 mg | CHEWABLE_TABLET | ORAL | Status: AC

## 2011-10-24 MED ORDER — ASPIRIN EC 81 MG PO TBEC
81.0000 mg | DELAYED_RELEASE_TABLET | Freq: Every day | ORAL | Status: DC
  Administered 2011-10-24 – 2011-10-25 (×2): 81 mg via ORAL
  Filled 2011-10-24 (×2): qty 1

## 2011-10-24 MED ORDER — FAMOTIDINE 20 MG PO TABS
20.0000 mg | ORAL_TABLET | Freq: Two times a day (BID) | ORAL | Status: DC
  Administered 2011-10-24 – 2011-10-25 (×3): 20 mg via ORAL
  Filled 2011-10-24 (×5): qty 1

## 2011-10-24 MED ORDER — LIDOCAINE HCL (PF) 1 % IJ SOLN
INTRAMUSCULAR | Status: AC
  Filled 2011-10-24: qty 30

## 2011-10-24 MED ORDER — HEPARIN (PORCINE) IN NACL 2-0.9 UNIT/ML-% IJ SOLN
INTRAMUSCULAR | Status: AC
  Filled 2011-10-24: qty 2000

## 2011-10-24 MED ORDER — DIAZEPAM 5 MG PO TABS
5.0000 mg | ORAL_TABLET | ORAL | Status: AC
  Administered 2011-10-24: 5 mg via ORAL
  Filled 2011-10-24: qty 1

## 2011-10-24 NOTE — ED Notes (Signed)
Attempted to call report bed [id number],  Tammy Lloyd [id number] not available to take report at this time

## 2011-10-24 NOTE — CV Procedure (Signed)
Left cardiac cath report dictated on 10/24/2011 dictation number is [id number]

## 2011-10-24 NOTE — Plan of Care (Signed)
Problem: Phase I Progression Outcomes Goal: Aspirin unless contraindicated Outcome: Completed/Met Date Met:  10/24/11 Given in ED

## 2011-10-24 NOTE — ED Notes (Signed)
Report called to [facility] RN  room [phone] ready bed

## 2011-10-24 NOTE — H&P (Signed)
  No change in H&P done yesterday

## 2011-10-24 NOTE — Progress Notes (Signed)
ANTICOAGULATION CONSULT NOTE - Initial Consult  Pharmacy Consult for heparin Indication: chest pain/ACS  No Known Allergies  Vital Signs: Temp: 98.2 F (36.8 C) (04/16 0139) Temp src: Oral (04/16 0139) BP: 140/47 mmHg (04/16 0139) Pulse Rate: 76  (04/16 0139)  Labs:  Basename 10/23/11 2021 10/23/11 1856 10/23/11 1853  HGB -- -- 12.2  HCT -- -- 35.8*  PLT -- -- 355  APTT -- -- --  LABPROT 13.7 -- --  INR 1.03 -- --  HEPARINUNFRC -- -- --  CREATININE -- -- 1.09  CKTOTAL -- 79 --  CKMB -- 3.1 --  TROPONINI <0.30 -- --   Medical History: Past Medical History  Diagnosis Date  . Arthritis   . Hemorrhoid   . Cataract   . Cancer     Breast  . Hypertension   . Hemochromatosis     Followed by  hematology;  . Osteoporosis   . Colon polyp   . Idiopathic hemochromatosis 09/28/2011  . Pulmonary hypertension   . Edema of both legs     Medications:  Prescriptions prior to admission  Medication Sig Dispense Refill  . acetaminophen (TYLENOL) 325 MG tablet Take 650 mg by mouth every 6 (six) hours as needed. For pain      . amLODipine (NORVASC) 5 MG tablet Take 7.5 mg by mouth daily.      . Cholecalciferol (VITAMIN D) 1000 UNITS capsule Take 1,000 Units by mouth every other day.       . losartan-hydrochlorothiazide (HYZAAR) 100-25 MG per tablet Take 1 tablet by mouth daily.  90 tablet  4  . Prednicarbate 0.1 % CREA Apply 1 application topically 2 (two) times daily as needed. For itching      . QUININE SULFATE PO Take 3 tablets by mouth as needed. For leg cramps      . alendronate (FOSAMAX) 70 MG tablet Take 70 mg by mouth every 7 (seven) days. On Saturday.   Take with a full glass of water on an empty stomach.       Scheduled:    . aspirin  324 mg Oral Once  . aspirin  324 mg Oral NOW   Or  . aspirin  300 mg Rectal NOW  . aspirin  324 mg Oral Pre-Cath  . aspirin EC  81 mg Oral Daily  . atorvastatin  80 mg Oral q1800  . diazepam  5 mg Oral On Call  . famotidine  20 mg  Oral BID  . heparin  4,000 Units Intravenous Once  . metoprolol tartrate  25 mg Oral BID  . sodium chloride  3 mL Intravenous Q12H    Assessment: 78yo female c/o recurrent CP/pressure/burning 3/10 x1d, to begin heparin for USAP vs MI.  Goal of Therapy:  Heparin level 0.3-0.7 units/ml   Plan:  Will give heparin 4000 units IV bolus followed by gtt at 1000 units/hr and monitor heparin levels and CBC.  Colleen Can PharmD BCPS 10/24/2011,2:14 AM

## 2011-10-24 NOTE — Cardiovascular Report (Signed)
Tammy Lloyd, BRAND NO.:  [id number]  MEDICAL RECORD NO.:  [id number]  LOCATION:  3733                         FACILITY:  [facility]  PHYSICIAN:  Eduardo Osier. Sharyn Lull, M.D. DATE OF BIRTH:  03-Aug-1923  DATE OF PROCEDURE:  10/24/2011 DATE OF DISCHARGE:                           CARDIAC CATHETERIZATION   PROCEDURE:  Left cardiac cath with selective left and right coronary angiography, LV graphy via right groin using Judkins technique.  INDICATION FOR THE PROCEDURE:  Tammy Lloyd is an 78 year old female with past medical history significant for hypertension, hemochromatosis, history of CA of breast, pulmonary hypertension, degenerative joint disease.  She came to the ER complaining of recurrent retrosternal burning/pressure, chest pain since yesterday morning approximately 3:00 a.m., which woke her up.  She took Tums and aspirin with partial relief, went back to bed and woke up around 5:30 a.m., had recurrent chest pain off and on all day, grade 3/10.  Denies any nausea, vomiting, diaphoresis.  Denies shortness of breath.  Denies palpitation, lightheadedness, or syncope.  Denies any recent cardiac workup.  Due to typical anginal chest pain, discussed with the patient regarding various options of treatment, i.e., noninvasive stress testing versus left cath, possible PTCA stenting, its risks and benefits, i.e., death, MI, stroke, need for emergency CABG, local vascular complications, etc and consented for PCI procedure.  After obtaining the informed consent, the patient was brought to the cath lab and was placed on fluoroscopy table.  Right groin was prepped and draped in usual fashion.  1% Xylocaine was used for local anesthesia in the right groin.  With the help of thin wall needle, 6-French arterial sheath was placed.  The sheath was aspirated and flushed.  Next, 6-French left Judkins catheter was advanced over the wire under fluoroscopic guidance up to the ascending aorta.   Wire was pulled out.  The catheter was aspirated and connected to the Manifold. Catheter was further advanced and engaged into left coronary ostium. Multiple views of the left system were taken.  Next, catheter was disengaged and was pulled out over the wire and was replaced with 6- [place] right Judkins catheter, which was advanced over the wire under fluoroscopic guidance up to the ascending aorta.  Wire was pulled out. The catheter was aspirated and connected to the Manifold.  Catheter was further advanced and engaged into right coronary ostium.  Multiple views of the right system were taken.  Next, the catheter was disengaged and was pulled out over the wire and was replaced with 6-French pigtail catheter, which was advanced over the wire under fluoroscopic guidance up to the ascending aorta.  Wire was pulled out.  The catheter was aspirated and connected to the Manifold.  Catheter was further advanced across the aortic valve into the LV.  LV pressures were recorded.  Next, LV graft was done in 30 degree RAO position.  Post angiographic pressures were recorded from LV and then pullback pressures were recorded from the aorta and there was no gradient across the aortic valve.  Next, the pigtail catheter was pulled out over the wire. Sheaths were aspirated and flushed findings.  FINDINGS:  LV showed good LV systolic function.  EF of 55-60%.  Left main was patent.  LAD has 5-10% ostial stenosis, and 10-15% proximal stenosis.  Diagonal 1 is moderate size, which is patent.  Left circumflex has 10-15% ostial stenosis.  It is tortuous in the proximal portion.  OM-1 and OM-2 were very small.  OM-3 was moderate size which was patent.  RCA is large which is patent.  PDA and PLV branches were patent.  The patient tolerated the procedure well.  There were no complications.  The patient was transferred to recovery room in stable condition.     Eduardo Osier. Sharyn Lull, M.D.     MNH/MEDQ  D:   10/24/2011  T:  10/24/2011  Job:  [id number]

## 2011-10-24 NOTE — ED Notes (Signed)
teransported to [id number] via strecther

## 2011-10-25 DIAGNOSIS — E7800 Pure hypercholesterolemia, unspecified: Secondary | ICD-10-CM | POA: Diagnosis not present

## 2011-10-25 DIAGNOSIS — I10 Essential (primary) hypertension: Secondary | ICD-10-CM | POA: Diagnosis not present

## 2011-10-25 DIAGNOSIS — I200 Unstable angina: Secondary | ICD-10-CM | POA: Diagnosis not present

## 2011-10-25 LAB — CBC
HCT: 30.4 % — ABNORMAL LOW (ref 36.0–46.0)
Hemoglobin: 10.5 g/dL — ABNORMAL LOW (ref 12.0–15.0)
MCH: 33.5 pg (ref 26.0–34.0)
MCHC: 34.5 g/dL (ref 30.0–36.0)
MCV: 97.1 fL (ref 78.0–100.0)
RDW: 13.3 % (ref 11.5–15.5)

## 2011-10-25 LAB — POCT ACTIVATED CLOTTING TIME: Activated Clotting Time: 144 s

## 2011-10-25 MED ORDER — ASPIRIN 81 MG PO TBEC: 81.0000 mg | DELAYED_RELEASE_TABLET | Freq: Every day | ORAL | Status: DC

## 2011-10-25 MED ORDER — FAMOTIDINE 20 MG PO TABS: 20.0000 mg | ORAL_TABLET | Freq: Two times a day (BID) | ORAL | Status: DC

## 2011-10-25 MED ORDER — ATORVASTATIN CALCIUM 20 MG PO TABS: 20.0000 mg | ORAL_TABLET | Freq: Every day | ORAL | Status: DC

## 2011-10-25 NOTE — Discharge Summary (Signed)
  Discharge summary dictated on 10/25/2011 dictation number is [phone]

## 2011-10-25 NOTE — Discharge Summary (Signed)
Tammy Lloyd, Tammy Lloyd NO.:  [id number]  MEDICAL RECORD NO.:  [id number]  LOCATION:  3733                         FACILITY:  [facility]  PHYSICIAN:  Eduardo Osier. Sharyn Lull, M.D. DATE OF BIRTH:  [birth year]  DATE OF ADMISSION:  10/23/2011 DATE OF DISCHARGE:  10/25/2011                              DISCHARGE SUMMARY   ADMITTING DIAGNOSES: 1. Unstable angina, rule out myocardial infarction, hypertension,     history of hemochromatosis. 2. History of carcinoma of breast. 3. Degenerative joint disease.  FINAL DIAGNOSES: 1. Status post chest pain, myocardial infarction ruled out, mild     coronary artery disease, status post left catheterization. 2. Hypertension. 3. Hypercholesteremia. 4. History of hemochromatosis. 5. History of carcinoma of the breast. 6. Degenerative joint disease. 7. Gastroesophageal reflux disease.  DISCHARGE HOME MEDICATIONS: 1. Enteric-coated aspirin 81 mg 1 tablet daily. 2. Atorvastatin 20 mg 1 tablet daily. 3. Pepcid 20 mg 1 tablet twice daily. 4. Tylenol 650 mg every 6 hours as needed for pain. 5. Fosamax 70 mg every week as before. 6. Amlodipine 5 mg 1 tablet daily. 7. Hyzaar 100/25 mg 1 tablet daily. 8. Prednicarbate 0.1% cream for itching as before twice daily. 9. Quinine sulfate 3 tablets by mouth for leg cramps as needed. 10.Vitamin D 1000 units every other day.  The patient has been advised to drink orange juice or eat bananas as the patient is on Hyzaar.  Post cardiac cath instructions have been given.  Follow up with me in 1 week.  CONDITION ON DISCHARGE:  Stable.  BRIEF HISTORY AND HOSPITAL COURSE:  Tammy Lloyd is an 78 year old female with past medical history significant for hypertension, hemochromatosis, history of CA of breast, pulmonary hypertension, degenerative joint disease.  She came to the ER complaining of recurrent retrosternal burning/pressure chest pains since yesterday morning, which woke her up around 3:00 a.m.   She took some Tums and aspirin with partial relief and went back to sleep, again woke up at 5:30 a.m. with recurrent chest pain off and on all day, grade 3/10.  Denies any nausea, vomiting, or diaphoresis.  Denies shortness of breath, palpitation, lightheadedness, or syncope.  She denies any recent cardiac workup.  PAST MEDICAL HISTORY:  As above.  PAST SURGICAL HISTORY:  She had total knee arthroplasty in the past, and she had D and C in the past, had cataract extraction in the past and right breast lumpectomy in the past.  EXAMINATION:  GENERAL: She was alert, awake, oriented x3, in no acute distress. VITAL SIGNS: Blood pressure was 176/86, pulse was 87.  She was afebrile. EYES: Conjunctivae were pink. NECK:  Supple.  No JVD.  No bruit. LUNGS:  Clear to auscultation without rhonchi or rales. CARDIOVASCULAR:  S1, S2 were normal.  There was no murmur or gallop. ABDOMEN: Soft.  Bowel sounds were present.  Nontender. EXTREMITIES:  There is no clubbing, cyanosis, or edema. NEURO: Grossly intact.  LABS:  Sodium was 139, potassium 4.0, BUN 29, creatinine 1.09.  Fasting blood sugar was slightly elevated 107.  Three sets of cardiac enzymes were negative.  Cholesterol was 192, LDL 132, HDL 52, triglycerides were 42, and hemoglobin was 12.2,  hematocrit 35.8, white count of 8.7.  EKG showed normal sinus rhythm with reverse R-wave progressions in the anterior leads probably due to lead placement.  Cannot rule out anterior infarct.  Repeat EKG showed normal sinus rhythm with no acute ischemic changes.  BRIEF HOSPITAL COURSE:  The patient was admitted to telemetry unit.  MI was ruled out by serial enzymes and EKG.  The patient was discussed at length regarding noninvasive stress testing versus left cath, possible PTCA stenting, its risks and benefits, the patient subsequently underwent left cardiac cath with selective left and right coronary angiography.  As per procedure report, the patient  tolerated procedure well.  There were no complications.  Postprocedure, the patient did not had any episodes of chest pain.  The groin is stable with no evidence of hematoma or bruit.  The patient will be discharged home on above medications, and will be followed up in my office in 1 week.     Eduardo Osier. Sharyn Lull, M.D.     MNH/MEDQ  D:  10/25/2011  T:  10/25/2011  Job:  [id number]

## 2011-10-25 NOTE — Discharge Instructions (Signed)
Chest Pain (Nonspecific) It is often hard to give a specific diagnosis for the cause of chest pain. There is always a chance that your pain could be related to something serious, such as a heart attack or a blood clot in the lungs. You need to follow up with your caregiver for further evaluation. CAUSES   Heartburn.   Pneumonia or bronchitis.   Anxiety or stress.   Inflammation around your heart (pericarditis) or lung (pleuritis or pleurisy).   A blood clot in the lung.   A collapsed lung (pneumothorax). It can develop suddenly on its own (spontaneous pneumothorax) or from injury (trauma) to the chest.   Shingles infection (herpes zoster virus).  The chest wall is composed of bones, muscles, and cartilage. Any of these can be the source of the pain.  The bones can be bruised by injury.   The muscles or cartilage can be strained by coughing or overwork.   The cartilage can be affected by inflammation and become sore (costochondritis).  DIAGNOSIS  Lab tests or other studies, such as X-rays, electrocardiography, stress testing, or cardiac imaging, may be needed to find the cause of your pain.  TREATMENT   Treatment depends on what may be causing your chest pain. Treatment may include:   Acid blockers for heartburn.   Anti-inflammatory medicine.   Pain medicine for inflammatory conditions.   Antibiotics if an infection is present.   You may be advised to change lifestyle habits. This includes stopping smoking and avoiding alcohol, caffeine, and chocolate.   You may be advised to keep your head raised (elevated) when sleeping. This reduces the chance of acid going backward from your stomach into your esophagus.   Most of the time, nonspecific chest pain will improve within 2 to 3 days with rest and mild pain medicine.  HOME CARE INSTRUCTIONS   If antibiotics were prescribed, take your antibiotics as directed. Finish them even if you start to feel better.   For the next few  days, avoid physical activities that bring on chest pain. Continue physical activities as directed.   Do not smoke.   Avoid drinking alcohol.   Only take over-the-counter or prescription medicine for pain, discomfort, or fever as directed by your caregiver.   Follow your caregiver's suggestions for further testing if your chest pain does not go away.   Keep any follow-up appointments you made. If you do not go to an appointment, you could develop lasting (chronic) problems with pain. If there is any problem keeping an appointment, you must call to reschedule.  SEEK MEDICAL CARE IF:   You think you are having problems from the medicine you are taking. Read your medicine instructions carefully.   Your chest pain does not go away, even after treatment.   You develop a rash with blisters on your chest.  SEEK IMMEDIATE MEDICAL CARE IF:   You have increased chest pain or pain that spreads to your arm, neck, jaw, back, or abdomen.   You develop shortness of breath, an increasing cough, or you are coughing up blood.   You have severe back or abdominal pain, feel nauseous, or vomit.   You develop severe weakness, fainting, or chills.   You have a fever.  THIS IS AN EMERGENCY. Do not wait to see if the pain will go away. Get medical help at once. Call your local emergency services (911 in [place] Do not drive yourself to the hospital. MAKE SURE YOU:   Understand these instructions.     Will watch your condition.   Will get help right away if you are not doing well or get worse.  Document Released: 04/05/2005 Document Revised: 06/15/2011 Document Reviewed: 01/30/2008 [facility] Patient Information 2012 [place], [place].Coronary Angiography Coronary angiography is an X-ray procedure used to look at the arteries in the heart. In this procedure, a dye is injected through a long, hollow tube (catheter). The catheter is about the size of a piece of cooked spaghetti. The catheter injects a dye into  an artery in your groin. X-rays are then taken to show if there is a blockage in the arteries of your heart. BEFORE THE PROCEDURE  Let your caregiver know if you have allergies to shellfish or contrast dye. Also let your caregiver know if you have kidney problems or failure.  Do not eat or drink starting from midnight up to the time of the procedure, or as directed.  You may drink enough water to take your medications the morning of the procedure if you were instructed to do so.  You should be at the hospital or outpatient facility where the procedure is to be done 60 minutes prior to the procedure or as directed.  PROCEDURE You may be given an IV medication to help you relax before the procedure.  You will be prepared for the procedure by washing and shaving the area where the catheter will be inserted. This is usually done in the groin but may be done in the fold of your arm by your elbow.  A medicine will be given to numb your groin where the catheter will be inserted.  A specially trained doctor will insert the catheter into an artery in your groin. The catheter is guided by using a special type of X-ray (fluoroscopy) to the blood vessel being examined.  A special dye is then injected into the catheter and X-rays are taken. The dye helps to show where any narrowing or blockages are located in the heart arteries.  AFTER THE PROCEDURE  After the procedure you will be kept in bed lying flat for several hours. You will be instructed to not bend or cross your legs.  The groin insertion site will be watched and checked frequently.  The pulse in your feet will be checked frequently.  Additional blood tests, X-rays and an EKG may be done.  You may stay in the hospital overnight for observation.  SEEK IMMEDIATE MEDICAL CARE IF:  You develop chest pain, shortness of breath, feel faint, or pass out.  There is bleeding, swelling, or drainage from the catheter insertion site.  You develop pain,  discoloration, coldness, or severe bruising in the leg or area where the catheter was inserted.  You have a fever.  Document Released: 12/31/2002 Document Revised: 06/15/2011 Document Reviewed: 02/19/2008 [facility] Patient Information 2012 [place], [place].

## 2011-10-30 ENCOUNTER — Ambulatory Visit: Payer: Medicare Other | Admitting: Family Medicine

## 2011-10-30 DIAGNOSIS — I2510 Atherosclerotic heart disease of native coronary artery without angina pectoris: Secondary | ICD-10-CM | POA: Diagnosis not present

## 2011-10-30 DIAGNOSIS — E7800 Pure hypercholesterolemia, unspecified: Secondary | ICD-10-CM | POA: Diagnosis not present

## 2011-10-30 DIAGNOSIS — I10 Essential (primary) hypertension: Secondary | ICD-10-CM | POA: Diagnosis not present

## 2011-11-02 ENCOUNTER — Encounter: Payer: Self-pay | Admitting: Family Medicine

## 2011-11-02 ENCOUNTER — Ambulatory Visit (INDEPENDENT_AMBULATORY_CARE_PROVIDER_SITE_OTHER): Payer: Medicare Other | Admitting: Family Medicine

## 2011-11-02 VITALS — BP 132/52 | HR 77 | Wt 209.0 lb

## 2011-11-02 DIAGNOSIS — K219 Gastro-esophageal reflux disease without esophagitis: Secondary | ICD-10-CM | POA: Diagnosis not present

## 2011-11-02 DIAGNOSIS — I10 Essential (primary) hypertension: Secondary | ICD-10-CM

## 2011-11-02 MED ORDER — AMLODIPINE BESYLATE 10 MG PO TABS: 10.0000 mg | ORAL_TABLET | Freq: Every day | ORAL | Status: DC

## 2011-11-02 NOTE — Progress Notes (Signed)
  Subjective:    Patient ID: Tammy Lloyd, female    DOB: [birth year], 78 y.o.   MRN: [id number]  HPI She is here for recheck. She was recently in the hospital and had an evaluation for chest pain. Her discharge summary was The cardiac catheterization was negative. Her cardiologist did recommend increasing her amlodipine and putting her on a statin drug. She is resistant to do both of these stating that she prefers not to go on anymore medications due to potential side effects.  Review of Systems     Objective:   Physical Exam Alert and in no distress. Cardiac exam shows a regular rhythm without murmurs or gallops. Lungs clear to auscultation.       Assessment & Plan:   1. Hypertension  amLODipine (NORVASC) 10 MG tablet  2. GERD (gastroesophageal reflux disease)     I did recommend increasing the Norvasc. Discussed the use of statin drugs in someone her age with clean coronaries. Explained that there was some benefit but also risk.

## 2011-11-02 NOTE — Patient Instructions (Signed)
Take the total of 10 mg of the amlodipine. I will call in a 10 mg pill when those run out If you don't want to take that Lipitor you have that right.

## 2011-12-01 ENCOUNTER — Encounter: Payer: Self-pay | Admitting: Cardiology

## 2011-12-15 ENCOUNTER — Telehealth: Payer: Self-pay | Admitting: Oncology

## 2011-12-15 ENCOUNTER — Other Ambulatory Visit (HOSPITAL_BASED_OUTPATIENT_CLINIC_OR_DEPARTMENT_OTHER): Payer: Medicare Other | Admitting: Lab

## 2011-12-15 ENCOUNTER — Encounter: Payer: Self-pay | Admitting: Family

## 2011-12-15 ENCOUNTER — Ambulatory Visit (HOSPITAL_BASED_OUTPATIENT_CLINIC_OR_DEPARTMENT_OTHER): Payer: Medicare Other | Admitting: Family

## 2011-12-15 DIAGNOSIS — M549 Dorsalgia, unspecified: Secondary | ICD-10-CM | POA: Diagnosis not present

## 2011-12-15 DIAGNOSIS — M25569 Pain in unspecified knee: Secondary | ICD-10-CM

## 2011-12-15 LAB — CBC WITH DIFFERENTIAL/PLATELET
BASO%: 0.8 % (ref 0.0–2.0)
HCT: 33.6 % — ABNORMAL LOW (ref 34.8–46.6)
LYMPH%: 29.3 % (ref 14.0–49.7)
MCHC: 33.2 g/dL (ref 31.5–36.0)
MCV: 100.2 fL (ref 79.5–101.0)
MONO%: 10.5 % (ref 0.0–14.0)
NEUT%: 54 % (ref 38.4–76.8)
Platelets: 346 10*3/uL (ref 145–400)
RBC: 3.35 10*6/uL — ABNORMAL LOW (ref 3.70–5.45)

## 2011-12-15 LAB — IRON AND TIBC
%SAT: 76 % — ABNORMAL HIGH (ref 20–55)
TIBC: 161 ug/dL — ABNORMAL LOW (ref 250–470)

## 2011-12-15 LAB — FERRITIN: Ferritin: 341 ng/mL — ABNORMAL HIGH (ref 10–291)

## 2011-12-15 NOTE — Progress Notes (Signed)
[facility]  Name: Tammy Lloyd                  DATE: 12/15/2011 MRN: [id number]                      DOB: 1923/11/13  REFERRING PHYSICIAN: Ronnald Nian, MD  DIAGNOSIS: Patient Active Problem List  Diagnoses Date Noted  . Acute coronary syndrome 10/23/2011  . Idiopathic hemochromatosis 09/28/2011  . GERD (gastroesophageal reflux disease) 09/06/2011  . Hypertension 12/19/2010  . Arthritis 12/19/2010  . History of breast cancer 12/19/2010  . Osteoporosis 12/19/2010     Encounter Diagnosis  Name Primary?  . Hemochromatosis Yes    PREVIOUS THERAPY: Phlebotomy to maintain Ferritin below 500.    CURRENT THERAPY: Phlebotomy as needed.   INTERIM HISTORY: Chief complaint today is knee and back pain. Has had knee replacement on the right, needs knee replacement on the left, but orthopedic surgeon is hesitant. Also has chronic back pain, declines back surgery. Result is limitied mobility due to chronic pain.   No headache or blurred vision. No cough or shortness of breath. No abdominal pain or new bone pain. Bowel and bladder function are normal. Appetite is good, with adequate fluid intake. Lives alone. Remainder of the 10 point  review of systems is negative.  PHYSICAL EXAM: BP 143/67  Pulse 82  Temp(Src) 97.9 F (36.6 C) (Oral)  Ht 5\' 6"  (1.676 m) General: Well developed, well nourished, in no acute distress.  EENT: No ocular or oral lesions. No stomatitis.  Respiratory: Lungs are clear to auscultation bilaterally with normal respiratory movement and no accessory muscle use. Cardiac: No murmur, rub or tachycardia. No upper or lower extremity edema.  GI: Abdomen is soft, no palpable hepatosplenomegaly. No fluid wave. No tenderness. Musculoskeletal: Mild kyphosis, tenderness over the spine and  Ribs to percussion. No hip pain. Lymph: No cervical, infraclavicular, axillary or inguinal adenopathy. Neuro: No focal neurological deficits. Psych: Alert and oriented X 3,  appropriate mood and affect.   LABORATORY STUDIES:   Results for orders placed in visit on 12/15/11  CBC WITH DIFFERENTIAL      Component Value Range   WBC 9.9  3.9 - 10.3 (10e3/uL)   NEUT# 5.4  1.5 - 6.5 (10e3/uL)   HGB 11.1 (*) 11.6 - 15.9 (g/dL)   HCT 40.9 (*) 81.1 - 46.6 (%)   Platelets 346  145 - 400 (10e3/uL)   MCV 100.2  79.5 - 101.0 (fL)   MCH 33.3  25.1 - 34.0 (pg)   MCHC 33.2  31.5 - 36.0 (g/dL)   RBC 9.14 (*) 7.82 - 5.45 (10e6/uL)   RDW 12.5  11.2 - 14.5 (%)   lymph# 2.9  0.9 - 3.3 (10e3/uL)   MONO# 1.0 (*) 0.1 - 0.9 (10e3/uL)   Eosinophils Absolute 0.5  0.0 - 0.5 (10e3/uL)   Basophils Absolute 0.1  0.0 - 0.1 (10e3/uL)   NEUT% 54.0  38.4 - 76.8 (%)   LYMPH% 29.3  14.0 - 49.7 (%)   MONO% 10.5  0.0 - 14.0 (%)   EOS% 5.4  0.0 - 7.0 (%)   BASO% 0.8  0.0 - 2.0 (%)  Ferritin, iron and TIBC pending.   IMPRESSION:  78 y/o female with: 1. History hemochromastosis, stable 2. CBC acceptable, Ferritin pending.   PLAN:   1. Nurse will call her for phlebotomy if Ferritin level greater than 500.  2. Return to see DR. Welton Flakes in 3  months with lab.   DISCUSSION:

## 2011-12-15 NOTE — Telephone Encounter (Signed)
gve the pt her sept 2013 appt calendar °

## 2011-12-26 ENCOUNTER — Telehealth: Payer: Self-pay | Admitting: Internal Medicine

## 2011-12-26 MED ORDER — ALENDRONATE SODIUM 70 MG PO TABS: 70.0000 mg | ORAL_TABLET | ORAL | Status: DC

## 2011-12-26 NOTE — Telephone Encounter (Signed)
Sent med in per fax 

## 2012-01-08 ENCOUNTER — Other Ambulatory Visit: Payer: Self-pay | Admitting: Gynecology

## 2012-01-08 ENCOUNTER — Telehealth: Payer: Self-pay | Admitting: Gynecology

## 2012-01-08 NOTE — Telephone Encounter (Signed)
Pharmacy faxed refill request for Predicarbate 0.1% topical ecrm GM #[id number].  Patient has not been seen in the office since 05/2010.  I faxed back a denial for the refill and asked them to have her call the office for an appointment.

## 2012-01-08 NOTE — Telephone Encounter (Signed)
error 

## 2012-01-09 ENCOUNTER — Encounter: Payer: Self-pay | Admitting: *Deleted

## 2012-01-10 ENCOUNTER — Ambulatory Visit (INDEPENDENT_AMBULATORY_CARE_PROVIDER_SITE_OTHER): Payer: Medicare Other | Admitting: Gynecology

## 2012-01-10 ENCOUNTER — Encounter: Payer: Self-pay | Admitting: Gynecology

## 2012-01-10 VITALS — BP 124/80 | Ht 65.0 in | Wt 208.0 lb

## 2012-01-10 DIAGNOSIS — L900 Lichen sclerosus et atrophicus: Secondary | ICD-10-CM

## 2012-01-10 DIAGNOSIS — M899 Disorder of bone, unspecified: Secondary | ICD-10-CM | POA: Diagnosis not present

## 2012-01-10 DIAGNOSIS — M8580 Other specified disorders of bone density and structure, unspecified site: Secondary | ICD-10-CM

## 2012-01-10 DIAGNOSIS — N952 Postmenopausal atrophic vaginitis: Secondary | ICD-10-CM

## 2012-01-10 DIAGNOSIS — L940 Localized scleroderma [morphea]: Secondary | ICD-10-CM | POA: Diagnosis not present

## 2012-01-10 DIAGNOSIS — M949 Disorder of cartilage, unspecified: Secondary | ICD-10-CM | POA: Diagnosis not present

## 2012-01-10 MED ORDER — PREDNICARBATE 0.1 % EX CREA: 1.0000 "application " | TOPICAL_CREAM | Freq: Two times a day (BID) | CUTANEOUS | Status: DC | PRN

## 2012-01-10 NOTE — Progress Notes (Signed)
Tammy Lloyd 05-Jun-1924 [id number]        78 y.o.  for annual follow up.  Several issues noted below.  Past medical history,surgical history, medications, allergies, family history and social history were all reviewed and documented in the EPIC chart. ROS:  Was performed and pertinent positives and negatives are included in the history.  Exam: Sherrilyn Rist assistant Filed Vitals:   01/10/12 1129  BP: 124/80   General appearance  Normal Skin grossly normal Head/Neck normal with no cervical or supraclavicular adenopathy thyroid normal Lungs  clear Cardiac RR, without RMG Abdominal  soft, nontender, without masses, organomegaly or hernia Breasts  examined lying and sitting without masses, retractions, discharge or axillary adenopathy. Pelvic  Ext/BUS/vagina  with atrophic genital changes, some symmetric bilateral whitish changes of the labia consistent with lichen sclerosis.  Vagina shortened to several centimeters.  Cervix  Atrophic upper vagina cervix not clearly visualized  Uterus  Not clearly palpated. No masses/tenderness noted on bimanual.   Adnexa  Without masses or tenderness    Anus and perineum  normal   Rectovaginal  normal sphincter tone without palpated masses or tenderness.    Assessment/Plan:  78 y.o. female for annual follow up.    1. Lichen sclerosus. Patient is doing well using prednicarbate 0.1% cream. I refilled her x1 tube. 2. Osteopenia. Patient originally had osteoporosis diagnosed in 2005 through [facility]EXA.  Noting a -3.1 T score on a lateral lumbar spine. She was started on Fosamax and did well. Her most recent DEXA 05/2010 shows osteopenia -1.5 with FRAX major osteoporotic risk 13% and hip fracture 3.3%. Her L3-L4 were discarded due to screws and plates whereas they were included in the 2005 scan. The issue about whether to continue on Fosamax now versus stopping with a drug-free holiday reviewed with her. She does have a slight increased risk of hip fracture on FRAX.  The issues of atypical fractures reviewed versus doing well without side effects and is using a cane to walk at age 57 and her risk of falling. After a lengthy discussion we decided to continue on Fosamax and she agrees with this. 3. Mammography. Patient had her mammogram last year asked her call just to verify when she is due for repeat. The issue of continuing at age 28 was reviewed.  Patient prefers to continue and she will schedule a mammogram. SBE monthly reviewed. 4. Pap smear. Patient has no history of abnormal Pap smears and no Pap smear was done today. Per current screening guidelines we'll stop screening at age 18. 5. Colonoscopy. Patient had her colonoscopy 3 years ago and will follow up with their recommended screening interval. 6. Health maintenance. Patient sees Dr. Susann Givens routinely and will continue to do so. No blood work was done today. She will see me in a year, sooner as needed.    Dara Lords MD, 12:01 PM 01/10/2012

## 2012-01-10 NOTE — Patient Instructions (Signed)
Follow up in one year for reexamination. 

## 2012-01-17 ENCOUNTER — Encounter: Payer: Self-pay | Admitting: Gynecology

## 2012-01-23 ENCOUNTER — Ambulatory Visit: Payer: Medicare Other | Admitting: Family Medicine

## 2012-03-18 ENCOUNTER — Encounter: Payer: Self-pay | Admitting: Oncology

## 2012-03-18 ENCOUNTER — Ambulatory Visit (HOSPITAL_BASED_OUTPATIENT_CLINIC_OR_DEPARTMENT_OTHER): Payer: Medicare Other | Admitting: Oncology

## 2012-03-18 ENCOUNTER — Other Ambulatory Visit: Payer: Self-pay | Admitting: Medical Oncology

## 2012-03-18 ENCOUNTER — Telehealth: Payer: Self-pay | Admitting: *Deleted

## 2012-03-18 ENCOUNTER — Other Ambulatory Visit (HOSPITAL_BASED_OUTPATIENT_CLINIC_OR_DEPARTMENT_OTHER): Payer: Medicare Other | Admitting: Lab

## 2012-03-18 VITALS — BP 136/71 | HR 74 | Temp 97.9°F | Resp 20 | Ht 65.0 in | Wt 208.3 lb

## 2012-03-18 DIAGNOSIS — Z853 Personal history of malignant neoplasm of breast: Secondary | ICD-10-CM | POA: Diagnosis not present

## 2012-03-18 DIAGNOSIS — M549 Dorsalgia, unspecified: Secondary | ICD-10-CM | POA: Diagnosis not present

## 2012-03-18 DIAGNOSIS — M810 Age-related osteoporosis without current pathological fracture: Secondary | ICD-10-CM | POA: Diagnosis not present

## 2012-03-18 LAB — CBC WITH DIFFERENTIAL/PLATELET
Basophils Absolute: 0.1 10*3/uL (ref 0.0–0.1)
EOS%: 7 % (ref 0.0–7.0)
Eosinophils Absolute: 0.6 10*3/uL — ABNORMAL HIGH (ref 0.0–0.5)
HCT: 33.9 % — ABNORMAL LOW (ref 34.8–46.6)
HGB: 11.6 g/dL (ref 11.6–15.9)
MCH: 34.1 pg — ABNORMAL HIGH (ref 25.1–34.0)
MCV: 100 fL (ref 79.5–101.0)
NEUT#: 3.8 10*3/uL (ref 1.5–6.5)
NEUT%: 47.4 % (ref 38.4–76.8)
RDW: 12.6 % (ref 11.2–14.5)
lymph#: 2.7 10*3/uL (ref 0.9–3.3)

## 2012-03-18 LAB — IRON AND TIBC
TIBC: 176 ug/dL — ABNORMAL LOW (ref 250–470)
UIBC: 16 ug/dL — ABNORMAL LOW (ref 125–400)

## 2012-03-18 LAB — FERRITIN: Ferritin: 512 ng/mL — ABNORMAL HIGH (ref 10–291)

## 2012-03-18 NOTE — Telephone Encounter (Signed)
see kk in December with labs  06-26-2012 starting at 9:00am with labs

## 2012-03-18 NOTE — Patient Instructions (Addendum)
No phlebotomy today  We will call you with blood test results  I will see you back in 3 months

## 2012-03-18 NOTE — Progress Notes (Signed)
OFFICE PROGRESS NOTE  CC [name], MD  [name], MD [address] [place] [address]  DIAGNOSIS: 78 year old with  1. Right breast cancer diagnosed November 2000  2. hemochromastosis diagnosed 2008 on phlebotomies to keep ferritin below 500  3. Osteoarthritis and chronic low back pain  4. Hypertension  5. Osteoporosis/osteopenia  CURRENT THERAPY:phlebotomies as needed  INTERVAL HISTORY: Tammy Lloyd 77 y.o. female returns for follow up visit. Overall she seems to be doing well. She continues to have pain in the back.He has no fevers or chills, no nausea or vomiting. She does have some difficulty walking and uses a cane. No bleeding or tingling or numbness. Remainder of the 10 point review of systems is negative.  MEDICAL HISTORY: Past Medical History  Diagnosis Date  . Arthritis   . Hemorrhoid   . Cataract   . Cancer     Breast  . Hypertension   . Hemochromatosis     Followed by  hematology;  . Colon polyp   . Idiopathic hemochromatosis 09/28/2011  . Pulmonary hypertension   . Edema of both legs   . Lichen sclerosus   . Osteoporosis 2005    ALLERGIES:   has no known allergies.  MEDICATIONS:  Current Outpatient Prescriptions  Medication Sig Dispense Refill  . acetaminophen (TYLENOL) 325 MG tablet Take 650 mg by mouth every 6 (six) hours as needed. For pain      . alendronate (FOSAMAX) 70 MG tablet Take 1 tablet (70 mg total) by mouth every 7 (seven) days. On Saturday.   Take with a full glass of water on an empty stomach.  12 tablet  3  . amLODipine (NORVASC) 10 MG tablet Take 1 tablet (10 mg total) by mouth daily.  90 tablet  3  . Cholecalciferol (VITAMIN D) 1000 UNITS capsule Take 1,000 Units by mouth every other day.       . losartan-hydrochlorothiazide (HYZAAR) 100-25 MG per tablet Take 1 tablet by mouth daily.  90 tablet  4  . Prednicarbate 0.1 % CREA Apply 1 application topically 2 (two) times daily as needed. For itching  60  g  0  . QUININE SULFATE PO Take 3 tablets by mouth as needed. For leg cramps      . famotidine (PEPCID) 20 MG tablet Take 1 tablet (20 mg total) by mouth 2 (two) times daily.  60 tablet  3    SURGICAL HISTORY:  Past Surgical History  Procedure Date  . Colonoscopy 2009  . Total knee arthroplasty 2006  . Dilation and curettage of uterus 2005  . Cataract extraction, bilateral   . Breast lumpectomy     Right  . Back surgery 2008  . Hysteroscopy 08/2003    REVIEW OF SYSTEMS:  Pertinent items are noted in HPI.   PHYSICAL EXAMINATION: General appearance: alert, cooperative and appears stated age Head: Normocephalic, without obvious abnormality, atraumatic Neck: no adenopathy, no carotid bruit, no JVD, supple, symmetrical, trachea midline and thyroid not enlarged, symmetric, no tenderness/mass/nodules Lymph nodes: Cervical, supraclavicular, and axillary nodes normal. Resp: clear to auscultation bilaterally and normal percussion bilaterally Back: symmetric, no curvature. ROM normal. No CVA tenderness. Cardio: regular rate and rhythm, S1, S2 normal, no murmur, click, rub or gallop GI: soft, non-tender; bowel sounds normal; no masses,  no organomegaly Extremities: edema Neurologic: Alert and oriented X 3, normal strength and tone. Normal symmetric reflexes. Normal coordination and gait  ECOG PERFORMANCE STATUS: 1 - Symptomatic but completely ambulatory  Blood  pressure 136/71, pulse 74, temperature 97.9 F (36.6 C), temperature source Oral, resp. rate 20, height 5\' 5"  (1.651 m), weight 208 lb 4.8 oz (94.484 kg).  LABORATORY DATA: Lab Results  Component Value Date   WBC 8.1 03/18/2012   HGB 11.6 03/18/2012   HCT 33.9* 03/18/2012   MCV 100.0 03/18/2012   PLT 277 03/18/2012      Chemistry      Component Value Date/Time   NA 142 10/24/2011 0335   NA 139 04/08/2010 1455   K 3.7 10/24/2011 0335   K 4.8* 04/08/2010 1455   CL 107 10/24/2011 0335   CL 104 04/08/2010 1455   CO2 22 10/24/2011 0335    CO2 28 04/08/2010 1455   BUN 30* 10/24/2011 0335   BUN 32* 04/08/2010 1455   CREATININE 1.09 10/24/2011 0335   CREATININE 1.06 12/19/2010 1617      Component Value Date/Time   CALCIUM 9.2 10/24/2011 0335   CALCIUM 9.0 04/08/2010 1455   ALKPHOS 69 10/24/2011 0335   ALKPHOS 78 04/08/2010 1455   AST 17 10/24/2011 0335   AST 27 04/08/2010 1455   ALT 10 10/24/2011 0335   BILITOT 0.3 10/24/2011 0335   BILITOT 0.40 04/08/2010 1455       RADIOGRAPHIC STUDIES:  No results found.  ASSESSMENT: 78 year old with 1. Breast cancer  2. Hemochromatosis will hold phlebotomy since her ferritin is under 500   PLAN:  1. Follow up in 3 months with cbc and iron studies  All questions were answered. The patient knows to call the clinic with any problems, questions or concerns. We can certainly see the patient much sooner if necessary.  I spent >15 minutes counseling the patient face to face. The total time spent in the appointment was 30 minutes.    Drue Second, MD Medical/Oncology [facility] [phone] (beeper) [phone] (Office)  03/18/2012, 10:28 AM

## 2012-05-07 ENCOUNTER — Encounter: Payer: Self-pay | Admitting: Family Medicine

## 2012-05-07 ENCOUNTER — Ambulatory Visit (INDEPENDENT_AMBULATORY_CARE_PROVIDER_SITE_OTHER): Payer: Medicare Other | Admitting: Family Medicine

## 2012-05-07 VITALS — BP 150/84 | HR 80 | Wt 204.0 lb

## 2012-05-07 DIAGNOSIS — I10 Essential (primary) hypertension: Secondary | ICD-10-CM | POA: Diagnosis not present

## 2012-05-07 DIAGNOSIS — M810 Age-related osteoporosis without current pathological fracture: Secondary | ICD-10-CM | POA: Diagnosis not present

## 2012-05-07 DIAGNOSIS — Z1231 Encounter for screening mammogram for malignant neoplasm of breast: Secondary | ICD-10-CM | POA: Diagnosis not present

## 2012-05-07 DIAGNOSIS — Z853 Personal history of malignant neoplasm of breast: Secondary | ICD-10-CM | POA: Diagnosis not present

## 2012-05-07 DIAGNOSIS — K219 Gastro-esophageal reflux disease without esophagitis: Secondary | ICD-10-CM

## 2012-05-07 DIAGNOSIS — M1990 Unspecified osteoarthritis, unspecified site: Secondary | ICD-10-CM

## 2012-05-07 DIAGNOSIS — M129 Arthropathy, unspecified: Secondary | ICD-10-CM

## 2012-05-07 NOTE — Patient Instructions (Signed)
Go ahead and stop the Fosamax 

## 2012-05-07 NOTE — Progress Notes (Signed)
  Subjective:    Patient ID: Tammy Lloyd, female    DOB: 08/21/1923, 78 y.o.   MRN: [id number]  HPI She is here for medication check. She does continue on Fosamax. She has been on this since 2005. The most recent DEXA scan did show osteopenia is supposed osteoporosis. She also has a history of hemochromatosis as well as breast cancer and is being followed by a motility/oncology for this. Her main complaint is arthritis. She has seen orthopedics in the past and apparently has spinal stenosis. Her reflux is under good control and she rarely uses Pepcid. She has no particular concerns or complaints other than as above. She is not using   Review of Systems Negative except as above    Objective:   Physical Exam alert and in no distress. Tympanic membranes and canals are normal. Throat is clear. Tonsils are normal. Neck is supple without adenopathy or thyromegaly. Cardiac exam shows a regular sinus rhythm without murmurs or gallops. Lungs are clear to auscultation.        Assessment & Plan:   1. Osteoporosis   2. Idiopathic hemochromatosis   3. Hypertension   4. History of breast cancer   5. GERD (gastroesophageal reflux disease)   6. Arthritis    scuffs the Fosamax and recommend she stop that. She will continue to be followed by hematology oncology. Continue on present blood pressure medications as well as medicine for her arthritis.

## 2012-05-08 ENCOUNTER — Encounter: Payer: Self-pay | Admitting: Family Medicine

## 2012-05-10 ENCOUNTER — Encounter: Payer: Self-pay | Admitting: Internal Medicine

## 2012-06-12 ENCOUNTER — Other Ambulatory Visit: Payer: Self-pay | Admitting: *Deleted

## 2012-06-12 MED ORDER — PREDNICARBATE 0.1 % EX CREA: 1.0000 "application " | TOPICAL_CREAM | Freq: Two times a day (BID) | CUTANEOUS | Status: DC | PRN

## 2012-06-12 MED ORDER — CLOBETASOL PROPIONATE 0.05 % EX CREA: TOPICAL_CREAM | Freq: Two times a day (BID) | CUTANEOUS | Status: DC

## 2012-06-19 ENCOUNTER — Telehealth: Payer: Self-pay | Admitting: Oncology

## 2012-06-19 NOTE — Telephone Encounter (Signed)
S/w the pt and she is aware that her appts have been cancelled on 06/26/2012 and r/s to 07/02/2012@9 :15am

## 2012-06-26 ENCOUNTER — Other Ambulatory Visit: Payer: Medicare Other | Admitting: Lab

## 2012-06-26 ENCOUNTER — Ambulatory Visit: Payer: Medicare Other | Admitting: Adult Health

## 2012-07-02 ENCOUNTER — Ambulatory Visit (HOSPITAL_BASED_OUTPATIENT_CLINIC_OR_DEPARTMENT_OTHER): Payer: Medicare Other | Admitting: Adult Health

## 2012-07-02 ENCOUNTER — Telehealth: Payer: Self-pay | Admitting: *Deleted

## 2012-07-02 ENCOUNTER — Encounter: Payer: Self-pay | Admitting: Adult Health

## 2012-07-02 ENCOUNTER — Other Ambulatory Visit (HOSPITAL_BASED_OUTPATIENT_CLINIC_OR_DEPARTMENT_OTHER): Payer: Medicare Other | Admitting: Lab

## 2012-07-02 VITALS — BP 151/71 | HR 75 | Temp 97.6°F | Resp 20 | Ht 65.0 in | Wt 206.8 lb

## 2012-07-02 DIAGNOSIS — M810 Age-related osteoporosis without current pathological fracture: Secondary | ICD-10-CM

## 2012-07-02 DIAGNOSIS — Z853 Personal history of malignant neoplasm of breast: Secondary | ICD-10-CM

## 2012-07-02 DIAGNOSIS — I10 Essential (primary) hypertension: Secondary | ICD-10-CM

## 2012-07-02 LAB — CBC WITH DIFFERENTIAL/PLATELET
Basophils Absolute: 0.1 10*3/uL (ref 0.0–0.1)
Eosinophils Absolute: 0.4 10*3/uL (ref 0.0–0.5)
HGB: 11.3 g/dL — ABNORMAL LOW (ref 11.6–15.9)
MCV: 100 fL (ref 79.5–101.0)
MONO#: 1.3 10*3/uL — ABNORMAL HIGH (ref 0.1–0.9)
MONO%: 11.2 % (ref 0.0–14.0)
NEUT#: 6.4 10*3/uL (ref 1.5–6.5)
RBC: 3.42 10*6/uL — ABNORMAL LOW (ref 3.70–5.45)
RDW: 12.4 % (ref 11.2–14.5)
WBC: 11.7 10*3/uL — ABNORMAL HIGH (ref 3.9–10.3)
lymph#: 3.5 10*3/uL — ABNORMAL HIGH (ref 0.9–3.3)

## 2012-07-02 LAB — IRON AND TIBC
%SAT: 91 % — ABNORMAL HIGH (ref 20–55)
Iron: 158 ug/dL — ABNORMAL HIGH (ref 42–145)
TIBC: 173 ug/dL — ABNORMAL LOW (ref 250–470)
UIBC: 15 ug/dL — ABNORMAL LOW (ref 125–400)

## 2012-07-02 LAB — FERRITIN: Ferritin: 297 ng/mL — ABNORMAL HIGH (ref 10–291)

## 2012-07-02 NOTE — Patient Instructions (Addendum)
Doing well.  We will get your iron studies back on Thursday and call if you need phlebotomy.  We will see you back in 3 months.  Please call us if you have any questions or concerns.

## 2012-07-02 NOTE — Progress Notes (Signed)
OFFICE PROGRESS NOTE  CC [name], MD  [name], MD [address] [place] [address]  DIAGNOSIS: 78 year old with  1. Right breast cancer diagnosed November 2000  2. hemochromastosis diagnosed 2008 on phlebotomies to keep ferritin below 500  3. Osteoarthritis and chronic low back pain  4. Hypertension  5. Osteoporosis/osteopenia  CURRENT THERAPY:phlebotomies as needed  INTERVAL HISTORY: MURL ZOGG 78 y.o. female returns for follow up visit. She's doing well today.  She has had no fevers, chills, pains, or any other problems.  She does use a cane to walk, but a 10 point ROS is neg.   MEDICAL HISTORY: Past Medical History  Diagnosis Date  . Arthritis   . Hemorrhoid   . Cataract   . Cancer     Breast  . Hypertension   . Hemochromatosis     Followed by  hematology;  . Colon polyp   . Idiopathic hemochromatosis 09/28/2011  . Pulmonary hypertension   . Edema of both legs   . Lichen sclerosus   . Osteoporosis 2005    ALLERGIES:   has no known allergies.  MEDICATIONS:  Current Outpatient Prescriptions  Medication Sig Dispense Refill  . acetaminophen (TYLENOL) 325 MG tablet Take 650 mg by mouth every 6 (six) hours as needed. For pain      . amLODipine (NORVASC) 10 MG tablet Take 1 tablet (10 mg total) by mouth daily.  90 tablet  3  . Cholecalciferol (VITAMIN D) 1000 UNITS capsule Take 1,000 Units by mouth every other day.       . clobetasol cream (TEMOVATE) 0.05 % Apply topically 2 (two) times daily.  30 g  0  . losartan-hydrochlorothiazide (HYZAAR) 100-25 MG per tablet Take 1 tablet by mouth daily.  90 tablet  4  . Prednicarbate 0.1 % CREA Apply 1 application topically 2 (two) times daily as needed. For itching  60 g  0  . QUININE SULFATE PO Take 3 tablets by mouth as needed. For leg cramps      . alendronate (FOSAMAX) 70 MG tablet Take 1 tablet (70 mg total) by mouth every 7 (seven) days. On Saturday.   Take with a full glass of  water on an empty stomach.  12 tablet  3  . famotidine (PEPCID) 20 MG tablet Take 1 tablet (20 mg total) by mouth 2 (two) times daily.  60 tablet  3    SURGICAL HISTORY:  Past Surgical History  Procedure Date  . Colonoscopy 2009  . Total knee arthroplasty 2006  . Dilation and curettage of uterus 2005  . Cataract extraction, bilateral   . Breast lumpectomy     Right  . Back surgery 2008  . Hysteroscopy 08/2003    REVIEW OF SYSTEMS:  General: fatigue (-), night sweats (-), fever (-), pain (-) Lymph: palpable nodes (-) HEENT: vision changes (-), mucositis (-), gum bleeding (-), epistaxis (-) Cardiovascular: chest pain (-), palpitations (-) Pulmonary: shortness of breath (-), dyspnea on exertion (-), cough (-), hemoptysis (-) GI:  Early satiety (-), melena (-), dysphagia (-), nausea/vomiting (-), diarrhea (-) GU: dysuria (-), hematuria (-), incontinence (-) Musculoskeletal: joint swelling (-), joint pain (-), back pain (-) Neuro: weakness (-), numbness (-), headache (-), confusion (-) Skin: Rash (-), lesions (-), dryness (-) Psych: depression (-), suicidal/homicidal ideation (-), feeling of hopelessness (-)  PHYSICAL EXAMINATION: BP 151/71  Pulse 75  Temp 97.6 F (36.4 C)  Resp 20  Ht 5\' 5"  (1.651 m)  Wt  206 lb 12.8 oz (93.804 kg)  BMI 34.41 kg/m2 General: Patient is a well appearing female in no acute distress HEENT: PERRLA, sclerae anicteric no conjunctival pallor, MMM Neck: supple, no palpable adenopathy Lungs: clear to auscultation bilaterally, no wheezes, rhonchi, or rales Cardiovascular: regular rate rhythm, S1, S2, no murmurs, rubs or gallops Abdomen: Soft, non-tender, non-distended, normoactive bowel sounds, no HSM Extremities: warm and well perfused, no clubbing, cyanosis, or edema Skin: No rashes or lesions Neuro: Non-focal ECOG PERFORMANCE STATUS: 1 - Symptomatic but completely ambulatory   LABORATORY DATA: Lab Results  Component Value Date   WBC 11.7*  07/02/2012   HGB 11.3* 07/02/2012   HCT 34.2* 07/02/2012   MCV 100.0 07/02/2012   PLT 286 07/02/2012      Chemistry      Component Value Date/Time   NA 142 10/24/2011 0335   NA 139 04/08/2010 1455   K 3.7 10/24/2011 0335   K 4.8* 04/08/2010 1455   CL 107 10/24/2011 0335   CL 104 04/08/2010 1455   CO2 22 10/24/2011 0335   CO2 28 04/08/2010 1455   BUN 30* 10/24/2011 0335   BUN 32* 04/08/2010 1455   CREATININE 1.09 10/24/2011 0335   CREATININE 1.06 12/19/2010 1617      Component Value Date/Time   CALCIUM 9.2 10/24/2011 0335   CALCIUM 9.0 04/08/2010 1455   ALKPHOS 69 10/24/2011 0335   ALKPHOS 78 04/08/2010 1455   AST 17 10/24/2011 0335   AST 27 04/08/2010 1455   ALT 10 10/24/2011 0335   BILITOT 0.3 10/24/2011 0335   BILITOT 0.40 04/08/2010 1455       RADIOGRAPHIC STUDIES:  No results found.  ASSESSMENT: 78 year old with 1. Breast cancer  2. Hemochromatosis will call patient if needs phlebotomy on Thursday.     PLAN:  1. Follow up in 3 months with cbc and iron studies  All questions were answered. The patient knows to call the clinic with any problems, questions or concerns. We can certainly see the patient much sooner if necessary.  I spent >15 minutes counseling the patient face to face. The total time spent in the appointment was 30 minutes.    Cherie Ouch Lyn Hollingshead, NP Medical Oncology [facility] Phone: [phone] 07/02/2012, 10:31 AM

## 2012-07-02 NOTE — Telephone Encounter (Signed)
Gave patient appointment three month appointment

## 2012-09-30 ENCOUNTER — Telehealth: Payer: Self-pay | Admitting: Oncology

## 2012-09-30 ENCOUNTER — Encounter: Payer: Self-pay | Admitting: Adult Health

## 2012-09-30 ENCOUNTER — Other Ambulatory Visit (HOSPITAL_BASED_OUTPATIENT_CLINIC_OR_DEPARTMENT_OTHER): Payer: Medicare Other | Admitting: Lab

## 2012-09-30 ENCOUNTER — Ambulatory Visit (HOSPITAL_BASED_OUTPATIENT_CLINIC_OR_DEPARTMENT_OTHER): Payer: Medicare Other | Admitting: Adult Health

## 2012-09-30 DIAGNOSIS — I10 Essential (primary) hypertension: Secondary | ICD-10-CM | POA: Diagnosis not present

## 2012-09-30 DIAGNOSIS — Z853 Personal history of malignant neoplasm of breast: Secondary | ICD-10-CM | POA: Diagnosis not present

## 2012-09-30 DIAGNOSIS — M810 Age-related osteoporosis without current pathological fracture: Secondary | ICD-10-CM | POA: Diagnosis not present

## 2012-09-30 LAB — FERRITIN: Ferritin: 326 ng/mL — ABNORMAL HIGH (ref 10–291)

## 2012-09-30 LAB — COMPREHENSIVE METABOLIC PANEL (CC13)
ALT: 10 U/L (ref 0–55)
AST: 15 U/L (ref 5–34)
Albumin: 3.2 g/dL — ABNORMAL LOW (ref 3.5–5.0)
CO2: 26 mEq/L (ref 22–29)
Calcium: 8.9 mg/dL (ref 8.4–10.4)
Chloride: 106 mEq/L (ref 98–107)
Potassium: 4.2 mEq/L (ref 3.5–5.1)

## 2012-09-30 LAB — CBC WITH DIFFERENTIAL/PLATELET
BASO%: 0.8 % (ref 0.0–2.0)
Eosinophils Absolute: 0.5 10*3/uL (ref 0.0–0.5)
HCT: 34.8 % (ref 34.8–46.6)
LYMPH%: 26.2 % (ref 14.0–49.7)
MCHC: 32.6 g/dL (ref 31.5–36.0)
MCV: 98.2 fL (ref 79.5–101.0)
MONO#: 1.3 10*3/uL — ABNORMAL HIGH (ref 0.1–0.9)
MONO%: 11.3 % (ref 0.0–14.0)
NEUT%: 57.3 % (ref 38.4–76.8)
Platelets: 286 10*3/uL (ref 145–400)
RBC: 3.54 10*6/uL — ABNORMAL LOW (ref 3.70–5.45)
WBC: 11.1 10*3/uL — ABNORMAL HIGH (ref 3.9–10.3)

## 2012-09-30 LAB — IRON AND TIBC
%SAT: 86 % — ABNORMAL HIGH (ref 20–55)
Iron: 136 ug/dL (ref 42–145)

## 2012-09-30 NOTE — Telephone Encounter (Signed)
gv pt appt schedule for June.  °

## 2012-09-30 NOTE — Patient Instructions (Addendum)
Doing well.  We will call you tomorrow if you need a phlebotomy.  We will see you back in 3 months.  Please call us if you have any questions or concerns.

## 2012-09-30 NOTE — Progress Notes (Signed)
OFFICE PROGRESS NOTE  CC [name], MD  [name], MD [address] [place] [address]  DIAGNOSIS: 79 year old with  1. Right breast cancer diagnosed November 2000  2. hemochromastosis diagnosed 2008 on phlebotomies to keep ferritin below 500  3. Osteoarthritis and chronic low back pain  4. Hypertension  5. Osteoporosis/osteopenia  CURRENT THERAPY:phlebotomies as needed  INTERVAL HISTORY: Tammy Lloyd 79 y.o. female returns for follow up visit. She continues to do well.  She last had her iron checked in December and did not need a phlebotomy.  She denies headache, vision changes, swelling, chest pain, shortness of breath, or any other questions or concerns.  A 10 point ROS is neg.   MEDICAL HISTORY: Past Medical History  Diagnosis Date  . Arthritis   . Hemorrhoid   . Cataract   . Cancer     Breast  . Hypertension   . Hemochromatosis     Followed by  hematology;  . Colon polyp   . Idiopathic hemochromatosis 09/28/2011  . Pulmonary hypertension   . Edema of both legs   . Lichen sclerosus   . Osteoporosis 2005    ALLERGIES:  has No Known Allergies.  MEDICATIONS:  Current Outpatient Prescriptions  Medication Sig Dispense Refill  . acetaminophen (TYLENOL) 325 MG tablet Take 650 mg by mouth every 6 (six) hours as needed. For pain      . amLODipine (NORVASC) 10 MG tablet Take 1 tablet (10 mg total) by mouth daily.  90 tablet  3  . Cholecalciferol (VITAMIN D) 1000 UNITS capsule Take 1,000 Units by mouth every other day.       . clobetasol cream (TEMOVATE) 0.05 % Apply topically 2 (two) times daily.  30 g  0  . losartan-hydrochlorothiazide (HYZAAR) 100-25 MG per tablet Take 1 tablet by mouth daily.  90 tablet  4  . Prednicarbate 0.1 % CREA Apply 1 application topically 2 (two) times daily as needed. For itching  60 g  0  . QUININE SULFATE PO Take 3 tablets by mouth as needed. For leg cramps      . alendronate (FOSAMAX) 70 MG tablet Take 1  tablet (70 mg total) by mouth every 7 (seven) days. On Saturday.   Take with a full glass of water on an empty stomach.  12 tablet  3  . famotidine (PEPCID) 20 MG tablet Take 1 tablet (20 mg total) by mouth 2 (two) times daily.  60 tablet  3   No current facility-administered medications for this visit.    SURGICAL HISTORY:  Past Surgical History  Procedure Laterality Date  . Colonoscopy  2009  . Total knee arthroplasty  2006  . Dilation and curettage of uterus  2005  . Cataract extraction, bilateral    . Breast lumpectomy      Right  . Back surgery  2008  . Hysteroscopy  08/2003    REVIEW OF SYSTEMS:  General: fatigue (-), night sweats (-), fever (-), pain (-) Lymph: palpable nodes (-) HEENT: vision changes (-), mucositis (-), gum bleeding (-), epistaxis (-) Cardiovascular: chest pain (-), palpitations (-) Pulmonary: shortness of breath (-), dyspnea on exertion (-), cough (-), hemoptysis (-) GI:  Early satiety (-), melena (-), dysphagia (-), nausea/vomiting (-), diarrhea (-) GU: dysuria (-), hematuria (-), incontinence (-) Musculoskeletal: joint swelling (-), joint pain (-), back pain (-) Neuro: weakness (-), numbness (-), headache (-), confusion (-) Skin: Rash (-), lesions (-), dryness (-) Psych: depression (-), suicidal/homicidal ideation (-),  feeling of hopelessness (-)  PHYSICAL EXAMINATION: BP 147/67  Pulse 74  Temp(Src) 97.7 F (36.5 C) (Oral)  Resp 20  Ht 5\' 5"  (1.651 m)  Wt 204 lb 3.2 oz (92.625 kg)  BMI 33.98 kg/m2 General: Patient is a well appearing female in no acute distress HEENT: PERRLA, sclerae anicteric no conjunctival pallor, MMM Neck: supple, no palpable adenopathy Lungs: clear to auscultation bilaterally, no wheezes, rhonchi, or rales Cardiovascular: regular rate rhythm, S1, S2, no murmurs, rubs or gallops Abdomen: Soft, non-tender, non-distended, normoactive bowel sounds, no HSM Extremities: warm and well perfused, no clubbing, cyanosis, or  edema Skin: No rashes or lesions Neuro: Non-focal ECOG PERFORMANCE STATUS: 1 - Symptomatic but completely ambulatory   LABORATORY DATA: Lab Results  Component Value Date   WBC 11.1* 09/30/2012   HGB 11.3* 09/30/2012   HCT 34.8 09/30/2012   MCV 98.2 09/30/2012   PLT 286 09/30/2012      Chemistry      Component Value Date/Time   NA 141 09/30/2012 0913   NA 142 10/24/2011 0335   NA 139 04/08/2010 1455   K 4.2 09/30/2012 0913   K 3.7 10/24/2011 0335   K 4.8* 04/08/2010 1455   CL 106 09/30/2012 0913   CL 107 10/24/2011 0335   CL 104 04/08/2010 1455   CO2 26 09/30/2012 0913   CO2 22 10/24/2011 0335   CO2 28 04/08/2010 1455   BUN 29.6* 09/30/2012 0913   BUN 30* 10/24/2011 0335   BUN 32* 04/08/2010 1455   CREATININE 1.3* 09/30/2012 0913   CREATININE 1.09 10/24/2011 0335   CREATININE 1.06 12/19/2010 1617      Component Value Date/Time   CALCIUM 8.9 09/30/2012 0913   CALCIUM 9.2 10/24/2011 0335   CALCIUM 9.0 04/08/2010 1455   ALKPHOS 84 09/30/2012 0913   ALKPHOS 69 10/24/2011 0335   ALKPHOS 78 04/08/2010 1455   AST 15 09/30/2012 0913   AST 17 10/24/2011 0335   AST 27 04/08/2010 1455   ALT 10 09/30/2012 0913   ALT 10 10/24/2011 0335   BILITOT 0.41 09/30/2012 0913   BILITOT 0.3 10/24/2011 0335   BILITOT 0.40 04/08/2010 1455       RADIOGRAPHIC STUDIES:  No results found.  ASSESSMENT: 79 year old with 1. Breast cancer  2. Hemochromatosis   PLAN:  1. Follow up in 3 months with cbc and iron studies.  We will call patient tomorrow if she needs phlebotomies for hemachromatosis.    All questions were answered. The patient knows to call the clinic with any problems, questions or concerns. We can certainly see the patient much sooner if necessary.  I spent >15 minutes counseling the patient face to face. The total time spent in the appointment was 30 minutes.    Cherie Ouch Lyn Hollingshead, NP Medical Oncology [facility] Phone: [phone] 09/30/2012, 10:51 AM

## 2012-12-04 ENCOUNTER — Other Ambulatory Visit: Payer: Self-pay | Admitting: Family Medicine

## 2013-01-02 ENCOUNTER — Telehealth: Payer: Self-pay | Admitting: *Deleted

## 2013-01-02 ENCOUNTER — Ambulatory Visit (HOSPITAL_BASED_OUTPATIENT_CLINIC_OR_DEPARTMENT_OTHER): Payer: Medicare Other | Admitting: Oncology

## 2013-01-02 ENCOUNTER — Other Ambulatory Visit (HOSPITAL_BASED_OUTPATIENT_CLINIC_OR_DEPARTMENT_OTHER): Payer: Medicare Other | Admitting: Lab

## 2013-01-02 ENCOUNTER — Encounter: Payer: Self-pay | Admitting: Oncology

## 2013-01-02 VITALS — BP 166/58 | HR 73 | Temp 98.1°F | Resp 20 | Ht 65.0 in | Wt 201.6 lb

## 2013-01-02 DIAGNOSIS — Z853 Personal history of malignant neoplasm of breast: Secondary | ICD-10-CM

## 2013-01-02 LAB — CBC WITH DIFFERENTIAL/PLATELET
BASO%: 0.9 % (ref 0.0–2.0)
EOS%: 6.8 % (ref 0.0–7.0)
HGB: 11.2 g/dL — ABNORMAL LOW (ref 11.6–15.9)
MCH: 31.6 pg (ref 25.1–34.0)
MCHC: 32.8 g/dL (ref 31.5–36.0)
RBC: 3.54 10*6/uL — ABNORMAL LOW (ref 3.70–5.45)
RDW: 13.1 % (ref 11.2–14.5)
lymph#: 3.2 10*3/uL (ref 0.9–3.3)

## 2013-01-02 LAB — COMPREHENSIVE METABOLIC PANEL (CC13)
Alkaline Phosphatase: 77 U/L (ref 40–150)
BUN: 25.3 mg/dL (ref 7.0–26.0)
Glucose: 118 mg/dl (ref 70–140)
Total Bilirubin: 0.31 mg/dL (ref 0.20–1.20)

## 2013-01-02 LAB — IRON AND TIBC
Iron: 145 ug/dL (ref 42–145)
TIBC: 163 ug/dL — ABNORMAL LOW (ref 250–470)
UIBC: 18 ug/dL — ABNORMAL LOW (ref 125–400)

## 2013-01-02 NOTE — Telephone Encounter (Signed)
appts made and printed...td 

## 2013-01-02 NOTE — Patient Instructions (Addendum)
Doing well no need for doing phlebotomy today  I will see you back in November for follow up and blood counts

## 2013-01-02 NOTE — Progress Notes (Signed)
OFFICE PROGRESS NOTE  CC [name], MD  [name], MD [address] [place] [address]  DIAGNOSIS: 79 year old with  1. Right breast cancer diagnosed November 2000  2. hemochromastosis diagnosed 2008 on phlebotomies to keep ferritin below 500  3. Osteoarthritis and chronic low back pain  4. Hypertension  5. Osteoporosis/osteopenia  CURRENT THERAPY:phlebotomies as needed  INTERVAL HISTORY: Tammy Lloyd 79 y.o. female returns for follow up visit. She continues to do well.  She last had her iron checked   and did not need a phlebotomy.  She denies headache, vision changes, swelling, chest pain, shortness of breath, or any other questions or concerns.  A 10 point ROS is neg.   MEDICAL HISTORY: Past Medical History  Diagnosis Date  . Arthritis   . Hemorrhoid   . Cataract   . Cancer     Breast  . Hypertension   . Hemochromatosis     Followed by  hematology;  . Colon polyp   . Idiopathic hemochromatosis 09/28/2011  . Pulmonary hypertension   . Edema of both legs   . Lichen sclerosus   . Osteoporosis 2005    ALLERGIES:  has No Known Allergies.  MEDICATIONS:  Current Outpatient Prescriptions  Medication Sig Dispense Refill  . acetaminophen (TYLENOL) 325 MG tablet Take 650 mg by mouth every 6 (six) hours as needed. For pain      . amLODipine (NORVASC) 10 MG tablet Take 1 tablet by mouth  daily  90 tablet  1  . Cholecalciferol (VITAMIN D) 1000 UNITS capsule Take 1,000 Units by mouth every other day.       . clobetasol cream (TEMOVATE) 0.05 % Apply topically 2 (two) times daily.  30 g  0  . losartan-hydrochlorothiazide (HYZAAR) 100-25 MG per tablet Take 1 tablet by mouth  daily  90 tablet  1  . Prednicarbate 0.1 % CREA Apply 1 application topically 2 (two) times daily as needed. For itching  60 g  0  . QUININE SULFATE PO Take 3 tablets by mouth as needed. For leg cramps      . famotidine (PEPCID) 20 MG tablet Take 1 tablet (20 mg total) by  mouth 2 (two) times daily.  60 tablet  3   No current facility-administered medications for this visit.    SURGICAL HISTORY:  Past Surgical History  Procedure Laterality Date  . Colonoscopy  2009  . Total knee arthroplasty  2006  . Dilation and curettage of uterus  2005  . Cataract extraction, bilateral    . Breast lumpectomy      Right  . Back surgery  2008  . Hysteroscopy  08/2003    REVIEW OF SYSTEMS:  General: fatigue (-), night sweats (-), fever (-), pain (-) Lymph: palpable nodes (-) HEENT: vision changes (-), mucositis (-), gum bleeding (-), epistaxis (-) Cardiovascular: chest pain (-), palpitations (-) Pulmonary: shortness of breath (-), dyspnea on exertion (-), cough (-), hemoptysis (-) GI:  Early satiety (-), melena (-), dysphagia (-), nausea/vomiting (-), diarrhea (-) GU: dysuria (-), hematuria (-), incontinence (-) Musculoskeletal: joint swelling (-), joint pain (-), back pain (-) Neuro: weakness (-), numbness (-), headache (-), confusion (-) Skin: Rash (-), lesions (-), dryness (-) Psych: depression (-), suicidal/homicidal ideation (-), feeling of hopelessness (-)  PHYSICAL EXAMINATION: BP 166/58  Pulse 73  Temp(Src) 98.1 F (36.7 C) (Oral)  Resp 20  Ht 5\' 5"  (1.651 m)  Wt 201 lb 9.6 oz (91.445 kg)  BMI 33.55  kg/m2 General: Patient is a well appearing female in no acute distress HEENT: PERRLA, sclerae anicteric no conjunctival pallor, MMM Neck: supple, no palpable adenopathy Lungs: clear to auscultation bilaterally, no wheezes, rhonchi, or rales Cardiovascular: regular rate rhythm, S1, S2, no murmurs, rubs or gallops Abdomen: Soft, non-tender, non-distended, normoactive bowel sounds, no HSM Extremities: warm and well perfused, no clubbing, cyanosis, or edema Skin: No rashes or lesions Neuro: Non-focal ECOG PERFORMANCE STATUS: 1 - Symptomatic but completely ambulatory   LABORATORY DATA: Lab Results  Component Value Date   WBC 8.0 01/02/2013   HGB 11.2*  01/02/2013   HCT 34.1* 01/02/2013   MCV 96.3 01/02/2013   PLT 291 01/02/2013      Chemistry      Component Value Date/Time   NA 140 01/02/2013 0918   NA 142 10/24/2011 0335   NA 139 04/08/2010 1455   K 4.4 01/02/2013 0918   K 3.7 10/24/2011 0335   K 4.8* 04/08/2010 1455   CL 106 09/30/2012 0913   CL 107 10/24/2011 0335   CL 104 04/08/2010 1455   CO2 23 01/02/2013 0918   CO2 22 10/24/2011 0335   CO2 28 04/08/2010 1455   BUN 25.3 01/02/2013 0918   BUN 30* 10/24/2011 0335   BUN 32* 04/08/2010 1455   CREATININE 1.1 01/02/2013 0918   CREATININE 1.09 10/24/2011 0335   CREATININE 1.06 12/19/2010 1617      Component Value Date/Time   CALCIUM 9.4 01/02/2013 0918   CALCIUM 9.2 10/24/2011 0335   CALCIUM 9.0 04/08/2010 1455   ALKPHOS 77 01/02/2013 0918   ALKPHOS 69 10/24/2011 0335   ALKPHOS 78 04/08/2010 1455   AST 13 01/02/2013 0918   AST 17 10/24/2011 0335   AST 27 04/08/2010 1455   ALT 7 01/02/2013 0918   ALT 10 10/24/2011 0335   BILITOT 0.31 01/02/2013 0918   BILITOT 0.3 10/24/2011 0335   BILITOT 0.40 04/08/2010 1455       RADIOGRAPHIC STUDIES:  No results found.  ASSESSMENT: 79 year old with 1. Breast cancer  2. Hemochromatosis   PLAN:  #1Doing well no need for doing phlebotomy today  #2I will see you back in November for follow up and blood counts   All questions were answered. The patient knows to call the clinic with any problems, questions or concerns. We can certainly see the patient much sooner if necessary.  I spent >15 minutes counseling the patient face to face. The total time spent in the appointment was 30 minutes.   Drue Second, MD Medical/Oncology [facility] [phone] (beeper) [phone] (Office)  01/02/2013, 11:18 AM

## 2013-01-29 ENCOUNTER — Telehealth: Payer: Self-pay | Admitting: Family Medicine

## 2013-01-29 NOTE — Telephone Encounter (Signed)
Pt has appt 01/30/2013

## 2013-01-29 NOTE — Telephone Encounter (Signed)
Going to need to see her to fill out the forms since I am not sure why she is getting it.

## 2013-01-29 NOTE — Telephone Encounter (Signed)
Pt dropped off a parking placard form to be completed. Pt did state that you have not filled this out for her before. She has had one for the last several years but normally is filled out by a specialist but he has retired. She is requesting that you fill it out. I am sending form back in your folder. Please MAIL to pt when complete.

## 2013-02-03 ENCOUNTER — Ambulatory Visit (INDEPENDENT_AMBULATORY_CARE_PROVIDER_SITE_OTHER): Payer: Medicare Other | Admitting: Family Medicine

## 2013-02-03 ENCOUNTER — Encounter: Payer: Self-pay | Admitting: Family Medicine

## 2013-02-03 VITALS — BP 120/50 | HR 81 | Wt 200.0 lb

## 2013-02-03 DIAGNOSIS — M129 Arthropathy, unspecified: Secondary | ICD-10-CM | POA: Diagnosis not present

## 2013-02-03 DIAGNOSIS — Z23 Encounter for immunization: Secondary | ICD-10-CM | POA: Diagnosis not present

## 2013-02-03 DIAGNOSIS — M48061 Spinal stenosis, lumbar region without neurogenic claudication: Secondary | ICD-10-CM | POA: Diagnosis not present

## 2013-02-03 DIAGNOSIS — M1990 Unspecified osteoarthritis, unspecified site: Secondary | ICD-10-CM

## 2013-02-03 NOTE — Progress Notes (Signed)
  Subjective:    Patient ID: Tammy Lloyd, female    DOB: [birth year], 79 y.o.   MRN: [id number]  HPI She is here for consult. She does have underlying arthritis and has had a right total knee replacement. She also has a history of spinal stenosis. She does use a cane to get around. She apparently needs to the left knee replaced but is reluctant to do this or have any surgical intervention for her back.   Review of Systems     Objective:   Physical Exam Alert and in no distress otherwise not examined       Assessment & Plan:  Arthritis  Spinal stenosis of lumbar region  The plaque was filled out. I will also give Pneumovax

## 2013-04-08 ENCOUNTER — Telehealth: Payer: Self-pay | Admitting: Oncology

## 2013-04-08 NOTE — Telephone Encounter (Signed)
, °

## 2013-04-15 DIAGNOSIS — Z961 Presence of intraocular lens: Secondary | ICD-10-CM | POA: Diagnosis not present

## 2013-04-15 DIAGNOSIS — H02839 Dermatochalasis of unspecified eye, unspecified eyelid: Secondary | ICD-10-CM | POA: Diagnosis not present

## 2013-04-15 DIAGNOSIS — H26499 Other secondary cataract, unspecified eye: Secondary | ICD-10-CM | POA: Diagnosis not present

## 2013-04-17 ENCOUNTER — Ambulatory Visit (INDEPENDENT_AMBULATORY_CARE_PROVIDER_SITE_OTHER): Payer: Medicare Other | Admitting: Gynecology

## 2013-04-17 ENCOUNTER — Encounter: Payer: Self-pay | Admitting: Gynecology

## 2013-04-17 VITALS — BP 134/80 | Ht 65.0 in | Wt 199.0 lb

## 2013-04-17 DIAGNOSIS — L940 Localized scleroderma [morphea]: Secondary | ICD-10-CM

## 2013-04-17 DIAGNOSIS — N952 Postmenopausal atrophic vaginitis: Secondary | ICD-10-CM

## 2013-04-17 DIAGNOSIS — M899 Disorder of bone, unspecified: Secondary | ICD-10-CM

## 2013-04-17 DIAGNOSIS — L900 Lichen sclerosus et atrophicus: Secondary | ICD-10-CM

## 2013-04-17 DIAGNOSIS — M8580 Other specified disorders of bone density and structure, unspecified site: Secondary | ICD-10-CM

## 2013-04-17 MED ORDER — CLOBETASOL PROPIONATE 0.05 % EX CREA: TOPICAL_CREAM | Freq: Every evening | CUTANEOUS | Status: DC | PRN

## 2013-04-17 NOTE — Patient Instructions (Signed)
follow up in one year for annual exam 

## 2013-04-17 NOTE — Progress Notes (Signed)
Tammy Lloyd [birth year] [id number]        79 y.o.  G1P1 for followup exam.  Several issues noted below  Past medical history,surgical history, medications, allergies, family history and social history were all reviewed and documented in the EPIC chart.  ROS:  Performed and pertinent positives and negatives are included in the history, assessment and plan .  Exam: Kim assistant Filed Vitals:   04/17/13 1141  BP: 134/80  Height: 5\' 5"  (1.651 m)  Weight: 199 lb (90.266 kg)   General appearance  Normal Skin grossly normal Head/Neck normal with no cervical or supraclavicular adenopathy thyroid normal Lungs  clear Cardiac RR, without RMG Abdominal  soft, nontender, without masses, organomegaly or hernia Breasts  examined lying and sitting without masses, retractions, discharge or axillary adenopathy.well-healed right lumpectomy scar Pelvic  Ext/BUS/vagina  Significant atrophic changes with vagina shortened.  Without significant lichen sclerosus changes.  Cervix  Not clearly visualized  Uterus not clearly palpated, without masses or tenderness  Adnexa  Without masses or tenderness    Anus and perineum  normal   Rectovaginal  normal sphincter tone without palpated masses or tenderness.    Assessment/Plan:  79 y.o. G1P1 female for followup exam.   1. Postmenopausal.  Without significant hot flushes, night sweats or bleeding. Will continue to monitor. Patient knows to report any bleeding. 2. Lichen sclerosus. Patient is doing well using Temovate 0.05% cream. I refilled her x1 tube with 2 refills. 3. Osteopenia. Patient originally had osteoporosis diagnosed in 2005 through [facility] DEXA.  Noting a -3.1 T score on a lateral lumbar spine. She was started on Fosamax and did well. Her most recent DEXA 05/2010 shows osteopenia -1.5 with FRAX major osteoporotic risk 13% and hip fracture 3.3%. Her L3-L4 were discarded due to screws and plates whereas they were included in the 2005 scan. She  continued on Fosamax through last year when her primary physician stopped her.  I think this is reasonable given her total picture for a drug-free holiday. We'll plan repeat DEXA next year at a 2 year interval off of Fosamax to see how she's doing. She had been on tamoxifen and subsequent aromatase inhibitor for her breast cancer although his no longer on either of these. 4. Mammography 04/2012. History of right breast cancer. NED.  Patient has scheduled this fall and will followup for this. SBE monthly reviewed. 5. Pap smear 2008. No Pap smear done today.  Patient has no history of abnormal Pap smears.  Per current screening guidelines we'll stop screening at age 69. 6. Colonoscopy. Patient had her colonoscopy 5 years ago and will follow up with their recommended screening interval. 7. Health maintenance. Patient sees Dr. Susann Givens routinely and will continue to do so. No blood work was done today. She will see me in a year, sooner as needed.   Note: This document was prepared with digital dictation and possible smart phrase technology. Any transcriptional errors that result from this process are unintentional.   Dara Lords MD, 12:08 PM 04/17/2013

## 2013-04-18 ENCOUNTER — Telehealth: Payer: Self-pay | Admitting: *Deleted

## 2013-04-18 MED ORDER — PREDNICARBATE 0.1 % EX CREA: 1.0000 "application " | TOPICAL_CREAM | Freq: Two times a day (BID) | CUTANEOUS | Status: DC | PRN

## 2013-04-18 NOTE — Telephone Encounter (Signed)
Pt was given Rx for temovate 0.05% on 04/17/13 this Rx is $80 and too expensive for pt. Pt said she really could use Rx prednicarbate 0.1% cream for itching. Okay to switch? Please advise

## 2013-04-18 NOTE — Telephone Encounter (Signed)
OK 

## 2013-04-18 NOTE — Telephone Encounter (Signed)
Pt informed, Rx sent. 

## 2013-05-08 DIAGNOSIS — Z1231 Encounter for screening mammogram for malignant neoplasm of breast: Secondary | ICD-10-CM | POA: Diagnosis not present

## 2013-05-09 ENCOUNTER — Encounter: Payer: Self-pay | Admitting: Internal Medicine

## 2013-06-04 ENCOUNTER — Other Ambulatory Visit: Payer: Medicare Other | Admitting: Lab

## 2013-06-04 ENCOUNTER — Ambulatory Visit: Payer: Medicare Other | Admitting: Oncology

## 2013-06-06 ENCOUNTER — Other Ambulatory Visit: Payer: Self-pay | Admitting: Family Medicine

## 2013-06-10 ENCOUNTER — Telehealth: Payer: Self-pay | Admitting: *Deleted

## 2013-06-10 ENCOUNTER — Ambulatory Visit (HOSPITAL_BASED_OUTPATIENT_CLINIC_OR_DEPARTMENT_OTHER): Payer: Medicare Other | Admitting: Oncology

## 2013-06-10 ENCOUNTER — Other Ambulatory Visit (HOSPITAL_BASED_OUTPATIENT_CLINIC_OR_DEPARTMENT_OTHER): Payer: Medicare Other | Admitting: Lab

## 2013-06-10 DIAGNOSIS — M545 Low back pain, unspecified: Secondary | ICD-10-CM | POA: Diagnosis not present

## 2013-06-10 DIAGNOSIS — Z853 Personal history of malignant neoplasm of breast: Secondary | ICD-10-CM | POA: Diagnosis not present

## 2013-06-10 DIAGNOSIS — I10 Essential (primary) hypertension: Secondary | ICD-10-CM

## 2013-06-10 DIAGNOSIS — M810 Age-related osteoporosis without current pathological fracture: Secondary | ICD-10-CM | POA: Diagnosis not present

## 2013-06-10 DIAGNOSIS — M1990 Unspecified osteoarthritis, unspecified site: Secondary | ICD-10-CM | POA: Diagnosis not present

## 2013-06-10 LAB — IRON AND TIBC CHCC
%SAT: 51 % (ref 21–57)
Iron: 83 ug/dL (ref 41–142)
TIBC: 164 ug/dL — ABNORMAL LOW (ref 236–444)
UIBC: 81 ug/dL — ABNORMAL LOW (ref 120–384)

## 2013-06-10 LAB — CBC WITH DIFFERENTIAL/PLATELET
BASO%: 0.8 % (ref 0.0–2.0)
EOS%: 2.8 % (ref 0.0–7.0)
HCT: 37.4 % (ref 34.8–46.6)
LYMPH%: 34.9 % (ref 14.0–49.7)
MCH: 31.7 pg (ref 25.1–34.0)
MCHC: 32.3 g/dL (ref 31.5–36.0)
MCV: 98.1 fL (ref 79.5–101.0)
MONO%: 15 % — ABNORMAL HIGH (ref 0.0–14.0)
NEUT%: 46.5 % (ref 38.4–76.8)
Platelets: 284 10*3/uL (ref 145–400)

## 2013-06-10 LAB — BASIC METABOLIC PANEL (CC13)
Anion Gap: 11 mEq/L (ref 3–11)
BUN: 27.8 mg/dL — ABNORMAL HIGH (ref 7.0–26.0)
Calcium: 9.4 mg/dL (ref 8.4–10.4)
Glucose: 101 mg/dl (ref 70–140)
Sodium: 139 mEq/L (ref 136–145)

## 2013-06-10 NOTE — Progress Notes (Signed)
Quick Note:  Please call patient: iron studies good no need for phlebotomy this time ______

## 2013-06-10 NOTE — Telephone Encounter (Signed)
appts made and printed...td 

## 2013-06-10 NOTE — Progress Notes (Signed)
OFFICE PROGRESS NOTE  CC [name], MD  [name], MD [address] [place] [address]  DIAGNOSIS: 79 year old with  1. Right breast cancer diagnosed November 2000  2. hemochromastosis diagnosed 2008 on phlebotomies to keep ferritin below 500  3. Osteoarthritis and chronic low back pain  4. Hypertension  5. Osteoporosis/osteopenia  CURRENT THERAPY:phlebotomies as needed  INTERVAL HISTORY: Tammy Lloyd 79 y.o. female returns for follow up visit. She continues to do well.  She  had her iron checked   and did not need a phlebotomy.  She denies headache, vision changes, swelling, chest pain, shortness of breath, or any other questions or concerns.  A 10 point ROS is neg.   MEDICAL HISTORY: Past Medical History  Diagnosis Date  . Arthritis   . Hemorrhoid   . Cataract   . Cancer     Breast  . Hypertension   . Hemochromatosis     Followed by  hematology;  . Colon polyp   . Idiopathic hemochromatosis 09/28/2011  . Pulmonary hypertension   . Edema of both legs   . Lichen sclerosus   . Osteoporosis 2005    ALLERGIES:  has No Known Allergies.  MEDICATIONS:  Current Outpatient Prescriptions  Medication Sig Dispense Refill  . acetaminophen (TYLENOL) 325 MG tablet Take 650 mg by mouth every 6 (six) hours as needed. For pain      . amLODipine (NORVASC) 10 MG tablet Take 1 tablet by mouth   daily  90 tablet  1  . Cholecalciferol (VITAMIN D) 1000 UNITS capsule Take 1,000 Units by mouth every other day.       . losartan-hydrochlorothiazide (HYZAAR) 100-25 MG per tablet Take 1 tablet by mouth  daily  90 tablet  1  . Prednicarbate 0.1 % CREA Apply 1 application topically 2 (two) times daily as needed. For itching  60 g  1  . QUININE SULFATE PO Take 3 tablets by mouth as needed. For leg cramps       No current facility-administered medications for this visit.    SURGICAL HISTORY:  Past Surgical History  Procedure Laterality Date  . Colonoscopy   2009  . Total knee arthroplasty  2006  . Dilation and curettage of uterus  2005  . Cataract extraction, bilateral    . Breast lumpectomy      Right  . Back surgery  2008  . Hysteroscopy  08/2003    REVIEW OF SYSTEMS:  General: fatigue (-), night sweats (-), fever (-), pain (-) Lymph: palpable nodes (-) HEENT: vision changes (-), mucositis (-), gum bleeding (-), epistaxis (-) Cardiovascular: chest pain (-), palpitations (-) Pulmonary: shortness of breath (-), dyspnea on exertion (-), cough (-), hemoptysis (-) GI:  Early satiety (-), melena (-), dysphagia (-), nausea/vomiting (-), diarrhea (-) GU: dysuria (-), hematuria (-), incontinence (-) Musculoskeletal: joint swelling (-), joint pain (-), back pain (-) Neuro: weakness (-), numbness (-), headache (-), confusion (-) Skin: Rash (-), lesions (-), dryness (-) Psych: depression (-), suicidal/homicidal ideation (-), feeling of hopelessness (-)  PHYSICAL EXAMINATION: BP 147/75  Pulse 77  Temp(Src) 98 F (36.7 C) (Oral)  Resp 18  Ht 5\' 5"  (1.651 m)  Wt 198 lb 3 oz (89.897 kg)  BMI 32.98 kg/m2 General: Patient is a well appearing female in no acute distress HEENT: PERRLA, sclerae anicteric no conjunctival pallor, MMM Neck: supple, no palpable adenopathy Lungs: clear to auscultation bilaterally, no wheezes, rhonchi, or rales Cardiovascular: regular rate rhythm, S1, S2,  no murmurs, rubs or gallops Abdomen: Soft, non-tender, non-distended, normoactive bowel sounds, no HSM Extremities: warm and well perfused, no clubbing, cyanosis, or edema Skin: No rashes or lesions Neuro: Non-focal ECOG PERFORMANCE STATUS: 1 - Symptomatic but completely ambulatory   LABORATORY DATA: Lab Results  Component Value Date   WBC 9.5 06/10/2013   HGB 12.1 06/10/2013   HCT 37.4 06/10/2013   MCV 98.1 06/10/2013   PLT 284 06/10/2013      Chemistry      Component Value Date/Time   NA 139 06/10/2013 0936   NA 142 10/24/2011 0335   NA 139 04/08/2010 1455    K 4.7 06/10/2013 0936   K 3.7 10/24/2011 0335   K 4.8* 04/08/2010 1455   CL 106 09/30/2012 0913   CL 107 10/24/2011 0335   CL 104 04/08/2010 1455   CO2 24 06/10/2013 0936   CO2 22 10/24/2011 0335   CO2 28 04/08/2010 1455   BUN 27.8* 06/10/2013 0936   BUN 30* 10/24/2011 0335   BUN 32* 04/08/2010 1455   CREATININE 1.2* 06/10/2013 0936   CREATININE 1.09 10/24/2011 0335   CREATININE 1.06 12/19/2010 1617      Component Value Date/Time   CALCIUM 9.4 06/10/2013 0936   CALCIUM 9.2 10/24/2011 0335   CALCIUM 9.0 04/08/2010 1455   ALKPHOS 77 01/02/2013 0918   ALKPHOS 69 10/24/2011 0335   ALKPHOS 78 04/08/2010 1455   AST 13 01/02/2013 0918   AST 17 10/24/2011 0335   AST 27 04/08/2010 1455   ALT 7 01/02/2013 0918   ALT 10 10/24/2011 0335   ALT 18 04/08/2010 1455   BILITOT 0.31 01/02/2013 0918   BILITOT 0.3 10/24/2011 0335   BILITOT 0.40 04/08/2010 1455       RADIOGRAPHIC STUDIES:  No results found.  ASSESSMENT: 79 year old with 1. Breast cancer  2. Hemochromatosis   PLAN:  #1Doing well no need for doing phlebotomy today  #2 we will see the patient back in 6 months time. At that time we will obtain CBC and iron studies.  She wi.ll get a phlebotomy if her ferritin is greater than 500   All questions were answered. The patient knows to call the clinic with any problems, questions or concerns. We can certainly see the patient much sooner if necessary.  I spent >15 minutes counseling the patient face to face. The total time spent in the appointment was 30 minutes.   Drue Second, MD Medical/Oncology [facility] [phone] (beeper) [phone] (Office)  06/10/2013, 10:42 AM

## 2013-06-18 ENCOUNTER — Telehealth: Payer: Self-pay | Admitting: *Deleted

## 2013-06-18 NOTE — Telephone Encounter (Signed)
Notified pt iron studies normal no need for phlebotomy

## 2013-06-18 NOTE — Telephone Encounter (Signed)
Message copied by GARNER, Gerald Leitz on Wed Jun 18, 2013 10:17 AM ------      Message from: Victorino December      Created: Tue Jun 10, 2013  1:00 PM       Please call patient: iron studies good no need for phlebotomy this time ------

## 2013-10-05 ENCOUNTER — Emergency Department (INDEPENDENT_AMBULATORY_CARE_PROVIDER_SITE_OTHER)
Admission: EM | Admit: 2013-10-05 | Discharge: 2013-10-05 | Disposition: A | Discharge status: Nursing Home (Includes ICF) | Payer: Medicare Other | Source: Home / Self Care | Attending: Family Medicine | Admitting: Family Medicine

## 2013-10-05 ENCOUNTER — Emergency Department (HOSPITAL_COMMUNITY): Payer: Medicare Other

## 2013-10-05 ENCOUNTER — Encounter (HOSPITAL_COMMUNITY): Payer: Self-pay | Admitting: Emergency Medicine

## 2013-10-05 ENCOUNTER — Observation Stay (HOSPITAL_COMMUNITY)
Admission: EM | Admit: 2013-10-05 | Discharge: 2013-10-07 | Disposition: A | Discharge status: Home / Self Care | Payer: Medicare Other | Attending: Internal Medicine | Admitting: Internal Medicine

## 2013-10-05 DIAGNOSIS — I2789 Other specified pulmonary heart diseases: Secondary | ICD-10-CM | POA: Insufficient documentation

## 2013-10-05 DIAGNOSIS — F29 Unspecified psychosis not due to a substance or known physiological condition: Secondary | ICD-10-CM | POA: Diagnosis not present

## 2013-10-05 DIAGNOSIS — Z9181 History of falling: Secondary | ICD-10-CM | POA: Insufficient documentation

## 2013-10-05 DIAGNOSIS — G459 Transient cerebral ischemic attack, unspecified: Secondary | ICD-10-CM

## 2013-10-05 DIAGNOSIS — M810 Age-related osteoporosis without current pathological fracture: Secondary | ICD-10-CM | POA: Insufficient documentation

## 2013-10-05 DIAGNOSIS — D320 Benign neoplasm of cerebral meninges: Secondary | ICD-10-CM | POA: Insufficient documentation

## 2013-10-05 DIAGNOSIS — R269 Unspecified abnormalities of gait and mobility: Secondary | ICD-10-CM | POA: Insufficient documentation

## 2013-10-05 DIAGNOSIS — Z9010 Acquired absence of unspecified breast and nipple: Secondary | ICD-10-CM | POA: Diagnosis not present

## 2013-10-05 DIAGNOSIS — I249 Acute ischemic heart disease, unspecified: Secondary | ICD-10-CM

## 2013-10-05 DIAGNOSIS — Z96659 Presence of unspecified artificial knee joint: Secondary | ICD-10-CM | POA: Insufficient documentation

## 2013-10-05 DIAGNOSIS — Z853 Personal history of malignant neoplasm of breast: Secondary | ICD-10-CM | POA: Diagnosis not present

## 2013-10-05 DIAGNOSIS — K219 Gastro-esophageal reflux disease without esophagitis: Secondary | ICD-10-CM

## 2013-10-05 DIAGNOSIS — I639 Cerebral infarction, unspecified: Secondary | ICD-10-CM

## 2013-10-05 DIAGNOSIS — D329 Benign neoplasm of meninges, unspecified: Secondary | ICD-10-CM

## 2013-10-05 DIAGNOSIS — R200 Anesthesia of skin: Secondary | ICD-10-CM

## 2013-10-05 DIAGNOSIS — R209 Unspecified disturbances of skin sensation: Principal | ICD-10-CM | POA: Insufficient documentation

## 2013-10-05 DIAGNOSIS — I10 Essential (primary) hypertension: Secondary | ICD-10-CM | POA: Diagnosis not present

## 2013-10-05 DIAGNOSIS — I6350 Cerebral infarction due to unspecified occlusion or stenosis of unspecified cerebral artery: Secondary | ICD-10-CM | POA: Diagnosis not present

## 2013-10-05 DIAGNOSIS — M1990 Unspecified osteoarthritis, unspecified site: Secondary | ICD-10-CM

## 2013-10-05 DIAGNOSIS — I4891 Unspecified atrial fibrillation: Secondary | ICD-10-CM | POA: Diagnosis not present

## 2013-10-05 DIAGNOSIS — I720 Aneurysm of carotid artery: Secondary | ICD-10-CM | POA: Diagnosis not present

## 2013-10-05 DIAGNOSIS — I672 Cerebral atherosclerosis: Secondary | ICD-10-CM | POA: Diagnosis not present

## 2013-10-05 LAB — TROPONIN I: Troponin I: <0.3 ng/mL (ref <0.30)

## 2013-10-05 LAB — COMPREHENSIVE METABOLIC PANEL
ALBUMIN: 3.9 g/dL (ref 3.5–5.2)
ALK PHOS: 92 U/L (ref 39–117)
ALT: 8 U/L (ref 0–35)
AST: 16 U/L (ref 0–37)
BUN: 27 mg/dL — ABNORMAL HIGH (ref 6–23)
CO2: 22 mEq/L (ref 19–32)
Calcium: 9.5 mg/dL (ref 8.4–10.5)
Chloride: 98 mEq/L (ref 96–112)
Creatinine, Ser: 1.14 mg/dL — ABNORMAL HIGH (ref 0.50–1.10)
GFR calc Af Amer: 49 mL/min — ABNORMAL LOW (ref >90)
GFR calc non Af Amer: 42 mL/min — ABNORMAL LOW (ref >90)
Glucose, Bld: 113 mg/dL — ABNORMAL HIGH (ref 70–99)
POTASSIUM: 4.4 meq/L (ref 3.7–5.3)
Sodium: 138 mEq/L (ref 137–147)
TOTAL PROTEIN: 7.6 g/dL (ref 6.0–8.3)
Total Bilirubin: 0.3 mg/dL (ref 0.3–1.2)

## 2013-10-05 LAB — CBC WITH DIFFERENTIAL/PLATELET
BASOS ABS: 0 10*3/uL (ref 0.0–0.1)
BASOS PCT: 0 % (ref 0–1)
EOS ABS: 0.2 10*3/uL (ref 0.0–0.7)
Eosinophils Relative: 2 % (ref 0–5)
HCT: 38.1 % (ref 36.0–46.0)
Hemoglobin: 13.4 g/dL (ref 12.0–15.0)
Lymphocytes Relative: 27 % (ref 12–46)
Lymphs Abs: 2.6 10*3/uL (ref 0.7–4.0)
MCH: 32.4 pg (ref 26.0–34.0)
MCHC: 35.2 g/dL (ref 30.0–36.0)
MCV: 92 fL (ref 78.0–100.0)
Monocytes Absolute: 1.1 10*3/uL — ABNORMAL HIGH (ref 0.1–1.0)
Monocytes Relative: 11 % (ref 3–12)
NEUTROS PCT: 60 % (ref 43–77)
Neutro Abs: 5.9 10*3/uL (ref 1.7–7.7)
PLATELETS: 310 10*3/uL (ref 150–400)
RBC: 4.14 MIL/uL (ref 3.87–5.11)
RDW: 13.6 % (ref 11.5–15.5)
WBC: 9.9 10*3/uL (ref 4.0–10.5)

## 2013-10-05 MED ORDER — ASPIRIN 300 MG RE SUPP
300.0000 mg | Freq: Every day | RECTAL | Status: DC
  Filled 2013-10-05 (×3): qty 1

## 2013-10-05 MED ORDER — SENNOSIDES-DOCUSATE SODIUM 8.6-50 MG PO TABS: 1.0000 | ORAL_TABLET | Freq: Every evening | ORAL | Status: DC | PRN

## 2013-10-05 MED ORDER — ENOXAPARIN SODIUM 40 MG/0.4ML ~~LOC~~ SOLN
40.0000 mg | SUBCUTANEOUS | Status: DC
  Administered 2013-10-06 (×2): 40 mg via SUBCUTANEOUS
  Filled 2013-10-05 (×4): qty 0.4

## 2013-10-05 MED ORDER — ASPIRIN 325 MG PO TABS
325.0000 mg | ORAL_TABLET | Freq: Every day | ORAL | Status: DC
  Administered 2013-10-06 – 2013-10-07 (×3): 325 mg via ORAL
  Filled 2013-10-05 (×4): qty 1

## 2013-10-05 MED ORDER — SODIUM CHLORIDE 0.9 % IV SOLN: INTRAVENOUS | Status: DC

## 2013-10-05 MED ORDER — LOSARTAN POTASSIUM 50 MG PO TABS
100.0000 mg | ORAL_TABLET | Freq: Every day | ORAL | Status: DC
  Administered 2013-10-06 – 2013-10-07 (×2): 100 mg via ORAL
  Filled 2013-10-05 (×3): qty 2

## 2013-10-05 MED ORDER — AMLODIPINE BESYLATE 10 MG PO TABS
10.0000 mg | ORAL_TABLET | Freq: Every day | ORAL | Status: DC
  Administered 2013-10-06 – 2013-10-07 (×2): 10 mg via ORAL
  Filled 2013-10-05 (×2): qty 1

## 2013-10-05 NOTE — ED Provider Notes (Signed)
CSN: [id number]     Arrival date & time 10/05/13  1649 History   First MD Initiated Contact with Patient 10/05/13 1706     Chief Complaint  Patient presents with  . lt arm numbness      (Consider location/radiation/quality/duration/timing/severity/associated sxs/prior Treatment) HPI Comments: Patient is an 80 year old female who presents with complaints of left arm and left leg numbness which started yesterday evening. She denies any weakness or headache. Her daughter describes her as "not being quite right today". She denies any chest pain or shortness of breath.  Patient is a 80 y.o. female presenting with extremity weakness. The history is provided by the patient.  Extremity Weakness This is a new problem. The current episode started yesterday. The problem occurs constantly. The problem has not changed since onset.Pertinent negatives include no chest pain and no shortness of breath. Nothing aggravates the symptoms. Nothing relieves the symptoms. She has tried nothing for the symptoms. The treatment provided no relief.    Past Medical History  Diagnosis Date  . Arthritis   . Hemorrhoid   . Cataract   . Cancer     Breast  . Hypertension   . Hemochromatosis     Followed by  hematology;  . Colon polyp   . Idiopathic hemochromatosis 09/28/2011  . Pulmonary hypertension   . Edema of both legs   . Lichen sclerosus   . Osteoporosis 2005   Past Surgical History  Procedure Laterality Date  . Colonoscopy  2009  . Total knee arthroplasty  2006  . Dilation and curettage of uterus  2005  . Cataract extraction, bilateral    . Breast lumpectomy      Right  . Back surgery  2008  . Hysteroscopy  08/2003   Family History  Problem Relation Age of Onset  . Cancer Mother 26    GYN cancer (had hysterectomy)  . Heart disease Mother     CHF  . Cancer Father     liver  . Asthma Sister   . Breast cancer Sister 51  . Heart disease Brother     rheumatic heart attack  . Breast cancer  Sister 89  . Asthma Brother   . Cancer Brother     Prostate  . Cancer Brother     Prostate   History  Substance Use Topics  . Smoking status: Never Smoker   . Smokeless tobacco: Never Used  . Alcohol Use: No   OB History   Grav Para Term Preterm Abortions TAB SAB Ect Mult Living   1 1        1      Review of Systems  Respiratory: Negative for shortness of breath.   Cardiovascular: Negative for chest pain.  Musculoskeletal: Positive for extremity weakness.      Allergies  Review of patient's allergies indicates no known allergies.  Home Medications   Current Outpatient Rx  Name  Route  Sig  Dispense  Refill  . acetaminophen (TYLENOL) 325 MG tablet   Oral   Take 650 mg by mouth every 6 (six) hours as needed. For pain         . amLODipine (NORVASC) 10 MG tablet      Take 1 tablet by mouth   daily   90 tablet   1   . Cholecalciferol (VITAMIN D) 1000 UNITS capsule   Oral   Take 1,000 Units by mouth every other day.          . losartan-hydrochlorothiazide (HYZAAR)  100-25 MG per tablet      Take 1 tablet by mouth  daily   90 tablet   1   . Prednicarbate 0.1 % CREA   Topical   Apply 1 application topically 2 (two) times daily as needed. For itching   60 g   1   . QUININE SULFATE PO   Oral   Take 3 tablets by mouth as needed. For leg cramps          BP 154/65  Pulse 79  Temp(Src) 97.7 F (36.5 C)  Resp 18  Ht 5\' 5"  (1.651 m)  Wt 187 lb (84.823 kg)  BMI 31.12 kg/m2  SpO2 96% Physical Exam  Nursing note and vitals reviewed. Constitutional: She is oriented to person, place, and time. She appears well-developed and well-nourished. No distress.  HENT:  Head: Normocephalic and atraumatic.  Mouth/Throat: Oropharynx is clear and moist.  Eyes: EOM are normal. Pupils are equal, round, and reactive to light.  Neck: Normal range of motion. Neck supple.  Cardiovascular: Normal rate and regular rhythm.  Exam reveals no gallop and no friction rub.   No  murmur heard. Pulmonary/Chest: Effort normal and breath sounds normal. No respiratory distress. She has no wheezes.  Abdominal: Soft. Bowel sounds are normal. She exhibits no distension. There is no tenderness.  Musculoskeletal: Normal range of motion.  Neurological: She is alert and oriented to person, place, and time. No cranial nerve deficit. She exhibits normal muscle tone. Coordination normal.  Skin: Skin is warm and dry. She is not diaphoretic.    ED Course  Procedures (including critical care time) Labs Review Labs Reviewed - No data to display Imaging Review No results found.   EKG Interpretation   Date/Time:  Sunday October 05 2013 17:01:18 EDT Ventricular Rate:  64 PR Interval:    QRS Duration: 76 QT Interval:  402 QTC Calculation: 414 R Axis:   78 Text Interpretation:  Atrial fibrillation Low voltage QRS Nonspecific ST  abnormality Abnormal ECG Confirmed by DELOS  MD, Arushi Partridge ([id number]) on  10/05/2013 5:30:19 PM      MDM   Final diagnoses:  None    Patient is an 80 year old female with history of hypertension, breast cancer. She presents with complaint of numbness in the left arm which she has had since yesterday evening. Upon presentation she was found to be in atrial fibrillation. Workup also revealed saying small meningioma, however no stroke on the CAT scan. Laboratory studies are otherwise unremarkable. I've spoken with Dr. Armida Sans from neurology who recommends admission. Dr. Hal Hope agrees to admit.    Veryl Speak, MD 10/05/13 Curly Rim

## 2013-10-05 NOTE — ED Notes (Signed)
Patient states that she woke up this am with her left hand numb Denies any injury

## 2013-10-05 NOTE — H&P (Signed)
[facility] History and Physical  Tammy Lloyd EXB:[id number] DOB: 06-Feb-1924 DOA: 10/05/2013  Referring physician: ER physician. PCP: [name], MD   Chief Complaint: Left hand numbness.  HPI: Tammy Lloyd is a 80 y.o. female with history of breast cancer, hypertension, hemochromatosis was brought to the ER after patient's daughter found patient confused and was complaining of left hand numbness. Patient has been having left hand numbness since last night. Denies any focal deficits headache visual symptoms falls syncope difficulty speaking or swallowing. Patient states that since she came to the ER she has felt slightly dizzy. CT head shows a benign meningioma. On-call neurologist has been consulted. Patient has been admitted for further management. Patient's EKG shows atrial fibrillation which is new onset. Patient denies any chest pain or shortness of breath.  Review of Systems: As presented in the history of presenting illness, rest negative.  Past Medical History  Diagnosis Date  . Arthritis   . Hemorrhoid   . Cataract   . Cancer     Breast  . Hypertension   . Hemochromatosis     Followed by  hematology;  . Colon polyp   . Idiopathic hemochromatosis 09/28/2011  . Pulmonary hypertension   . Edema of both legs   . Lichen sclerosus   . Osteoporosis 2005   Past Surgical History  Procedure Laterality Date  . Colonoscopy  2009  . Total knee arthroplasty  2006  . Dilation and curettage of uterus  2005  . Cataract extraction, bilateral    . Breast lumpectomy      Right  . Back surgery  2008  . Hysteroscopy  08/2003   Social History:  reports that she has never smoked. She has never used smokeless tobacco. She reports that she does not drink alcohol or use illicit drugs. Where does patient live home. Can patient participate in ADLs? Yes.  No Known Allergies  Family History:  Family History  Problem Relation Age of Onset  . Cancer Mother 21    GYN cancer  (had hysterectomy)  . Heart disease Mother     CHF  . Cancer Father     liver  . Asthma Sister   . Breast cancer Sister 36  . Heart disease Brother     rheumatic heart attack  . Breast cancer Sister 76  . Asthma Brother   . Cancer Brother     Prostate  . Cancer Brother     Prostate      Prior to Admission medications   Medication Sig Start Date End Date Taking? Authorizing Provider  acetaminophen (TYLENOL) 325 MG tablet Take 650 mg by mouth every 6 (six) hours as needed. For pain   Yes Historical Provider, MD  amLODipine (NORVASC) 10 MG tablet Take 1 tablet by mouth   daily 06/06/13  Yes Denita Lung, MD  Cholecalciferol (VITAMIN D) 1000 UNITS capsule Take 1,000 Units by mouth daily.    Yes Historical Provider, MD  losartan-hydrochlorothiazide (HYZAAR) 100-25 MG per tablet Take 1 tablet by mouth  daily 06/06/13  Yes Denita Lung, MD  Prednicarbate 0.1 % CREA Apply 1 application topically 2 (two) times daily as needed. For itching 04/18/13  Yes Anastasio Auerbach, MD    Physical Exam: Filed Vitals:   10/05/13 1900 10/05/13 1930 10/05/13 2000 10/05/13 2011  BP: 155/54 155/71 155/61   Pulse: 80 77 80   Temp:    98.4 F (36.9 C)  Resp: 22 18 19    Height:  Weight:      SpO2: 93% 95% 95%      General:  Well-developed and nourished.  Eyes: Anicteric no pallor.  ENT: No discharge from the ears eyes nose mouth.  Neck: No mass felt.  Cardiovascular: S1-S2 heard.  Respiratory: No rhonchi or crepitations.  Abdomen: Soft nontender bowel sounds present.  Skin: No rash.  Musculoskeletal: No edema.  Psychiatric: Appears normal.  Neurologic: Alert awake oriented to time place and person. Moves all extremities.  Labs on Admission:  Basic Metabolic Panel:  Recent Labs Lab 10/05/13 1747  NA 138  K 4.4  CL 98  CO2 22  GLUCOSE 113*  BUN 27*  CREATININE 1.14*  CALCIUM 9.5   Liver Function Tests:  Recent Labs Lab 10/05/13 1747  AST 16  ALT 8   ALKPHOS 92  BILITOT 0.3  PROT 7.6  ALBUMIN 3.9   No results found for this basename: LIPASE, AMYLASE,  in the last 168 hours No results found for this basename: AMMONIA,  in the last 168 hours CBC:  Recent Labs Lab 10/05/13 1747  WBC 9.9  NEUTROABS 5.9  HGB 13.4  HCT 38.1  MCV 92.0  PLT 310   Cardiac Enzymes:  Recent Labs Lab 10/05/13 1747  TROPONINI <0.30    BNP (last 3 results) No results found for this basename: PROBNP,  in the last 8760 hours CBG: No results found for this basename: GLUCAP,  in the last 168 hours  Radiological Exams on Admission: Ct Head Wo Contrast  10/05/2013   CLINICAL DATA:  Left hand weakness, hypertension  EXAM: CT HEAD WITHOUT CONTRAST  TECHNIQUE: Contiguous axial images were obtained from the base of the skull through the vertex without intravenous contrast.  COMPARISON:  None.  FINDINGS: Negative for acute intracranial hemorrhage, acute infarction, hydrocephalus or midline shift. Gray-white differentiation is preserved throughout. A partially calcified, extra-axial dural-based mass is identified along the lateral aspect of the left posterior cranial fossa. The mass measures 1.8 x 2.6 cm in greatest dimension. There is mild local mass of fact on the cerebellum but no evidence of underlying edema. Mild global cerebral volume loss is commensurate with age. Mild periventricular white matter hypoattenuation is most consistent with the sequelae of longstanding microvascular ischemia. No focal soft tissue or calvarial abnormality. The globes and orbits are intact and unremarkable bilaterally. Normal aeration of the mastoid air cells atherosclerotic calcifications noted in the bilateral cavernous carotid arteries.  IMPRESSION: 1. No acute intracranial abnormality. 2. A 2.6 x 1.8 cm extra-axial dural-based and partially calcified mass in the lateral aspect of the left posterior cranial fossa almost certainly represents a benign meningioma. There is mild local  mass effect on the adjacent left cerebellar hemisphere but no evidence of associated edema or other acute abnormality. 3. Mild global cerebral atrophy and chronic microvascular ischemic white matter disease. 4. Intracranial atherosclerosis.   Electronically Signed   By: Jacqulynn Cadet M.D.   On: 10/05/2013 18:25    EKG: Independently reviewed. Atrial fibrillation rate controlled.  Assessment/Plan Principal Problem:   Left sided numbness Active Problems:   Hypertension   Meningioma   Atrial fibrillation   #1. Left arm numbness - concerning for CVA given that patient also has new onset atrial fibrillation. At this time patient has been placed on neurochecks swallow evaluation. Check MRI/MRA brain 2-D echo carotid Doppler hemoglobin A1c lipid panel. Patient has been placed on aspirin. Based on further workup anticoagulation to be decided by neurologist. #2. New onset atrial fibrillation -  presently rate controlled. Presently stroke workup ongoing. Anticoagulation to be decided by neurologist. #3. Meningioma - follow MRI brain. Further recommendations per neurologist. #4. Hypertension - hold diuretics and gently hydrate for now for possible stroke. Continue ARB. #5. History of breast cancer and hemachromatosis - per oncologist. Hemoglobin 13.4. Patient's ferritin is being followed by oncologist.  I have reviewed patient's old charts and labs.  Code Status: Full code.  Family Communication:  None.  Disposition Plan:  admit to inpatient.    Karron Goens N. [facility] Pager [phone].  If 7PM-7AM, please contact night-coverage [email or web address] Password [facility] 10/05/2013, 8:35 PM

## 2013-10-05 NOTE — ED Notes (Signed)
The pt lives alone and since last pm she has been c/o lt arm numbness.  Today she has had sl confusion.  No family has been able to determine when it started. Around 1200n the daughter went to the house and noticed the sl confusion.  No facial droop no arm drift

## 2013-10-05 NOTE — ED Provider Notes (Signed)
Tammy Lloyd is a 80 y.o. female who presents to Urgent Care today for left hand numbness. Patient will from sleep this morning and noted that her left hand was numb. She denies any significant weakness. Her daughter notes that she seems to be more confused than usual. She does not have a history of dementia. The confusion seems to come and go. She notes that the numbness has been slowly improving all day. Her daughter notes that the patient is quite stubborn and refused to go to the emergency room this morning. The daughter is concerned about stroke. No slurred speech or trouble swallowing or facial weakness. She is well otherwise.   Past Medical History  Diagnosis Date  . Arthritis   . Hemorrhoid   . Cataract   . Cancer     Breast  . Hypertension   . Hemochromatosis     Followed by  hematology;  . Colon polyp   . Idiopathic hemochromatosis 09/28/2011  . Pulmonary hypertension   . Edema of both legs   . Lichen sclerosus   . Osteoporosis 2005   History  Substance Use Topics  . Smoking status: Never Smoker   . Smokeless tobacco: Never Used  . Alcohol Use: No   ROS as above Medications: No current facility-administered medications for this encounter.   Current Outpatient Prescriptions  Medication Sig Dispense Refill  . acetaminophen (TYLENOL) 325 MG tablet Take 650 mg by mouth every 6 (six) hours as needed. For pain      . amLODipine (NORVASC) 10 MG tablet Take 1 tablet by mouth   daily  90 tablet  1  . Cholecalciferol (VITAMIN D) 1000 UNITS capsule Take 1,000 Units by mouth every other day.       . losartan-hydrochlorothiazide (HYZAAR) 100-25 MG per tablet Take 1 tablet by mouth  daily  90 tablet  1  . Prednicarbate 0.1 % CREA Apply 1 application topically 2 (two) times daily as needed. For itching  60 g  1  . QUININE SULFATE PO Take 3 tablets by mouth as needed. For leg cramps        Exam:  BP 153/60  Pulse 68  Temp(Src) 98.8 F (37.1 C) (Oral)  Resp 16  SpO2 99% Gen:  Well NAD HEENT: EOMI,  MMM no facial droop.  Lungs: Normal work of breathing. CTABL Heart: RRR no MRG Abd: NABS, Soft. NT, ND Exts: Brisk capillary refill, warm and well perfused.  Neuro: Alert and oriented strength is intact throughout balance is reasonably intact with a cane. Slight decreased sensation in left hand compared to the right. Patient is alert oriented to  "emergency room" October 04, 2013. She thinks Tammy Lloyd is president. She is able to perform immediate recall of 3 item recall but completely unable to perform recall after 5 minutes of delay.    Assessment and Plan: 80 y.o. female with subjective left hand weakness with worsening orientation and confusion. This is concerning for transient ischemic attack.  She is not within window for TPA. Plan to transfer to the emergency room via shovel for further evaluation and management of possible TIA.   Discussed warning signs or symptoms. Please see discharge instructions. Patient expresses understanding.    Gregor Hams, MD 10/05/13 [phone]

## 2013-10-05 NOTE — Consult Note (Addendum)
Referring Physician: Hal Hope    Chief Complaint: Left hand numbness  HPI: Tammy Lloyd is an 80 y.o. female who reports that she went to bed at baseline last evening.  She awakened today and noticed that her left hand did not feel right.  She describes a tingling sensation.  She does not report any weakness or other focal neurological symptoms.  Her daughter went to the house today around noon and found the patient to be slightly confused.  History obtained from the patient and chart.  Family not available at this time.  Patient presented for evaluation at that time.      Date last known well: Date: 10/04/2013 Time last known well: Time: 21:00 tPA Given: No: Outside time window  Past Medical History  Diagnosis Date  . Arthritis   . Hemorrhoid   . Cataract   . Cancer     Breast  . Hypertension   . Hemochromatosis     Followed by  hematology;  . Colon polyp   . Idiopathic hemochromatosis 09/28/2011  . Pulmonary hypertension   . Edema of both legs   . Lichen sclerosus   . Osteoporosis 2005    Past Surgical History  Procedure Laterality Date  . Colonoscopy  2009  . Total knee arthroplasty  2006  . Dilation and curettage of uterus  2005  . Cataract extraction, bilateral    . Breast lumpectomy      Right  . Back surgery  2008  . Hysteroscopy  08/2003    Family History  Problem Relation Age of Onset  . Cancer Mother 75    GYN cancer (had hysterectomy)  . Heart disease Mother     CHF  . Cancer Father     liver  . Asthma Sister   . Breast cancer Sister 40  . Heart disease Brother     rheumatic heart attack  . Breast cancer Sister 29  . Asthma Brother   . Cancer Brother     Prostate  . Cancer Brother     Prostate   Social History:  reports that she has never smoked. She has never used smokeless tobacco. She reports that she does not drink alcohol or use illicit drugs.  Allergies: No Known Allergies  Medications:  I have reviewed the patient's current  medications. Prior to Admission:  Prescriptions prior to admission  Medication Sig Dispense Refill  . acetaminophen (TYLENOL) 325 MG tablet Take 650 mg by mouth every 6 (six) hours as needed. For pain      . amLODipine (NORVASC) 10 MG tablet Take 1 tablet by mouth   daily  90 tablet  1  . Cholecalciferol (VITAMIN D) 1000 UNITS capsule Take 1,000 Units by mouth daily.       Marland Kitchen losartan-hydrochlorothiazide (HYZAAR) 100-25 MG per tablet Take 1 tablet by mouth  daily  90 tablet  1  . Prednicarbate 0.1 % CREA Apply 1 application topically 2 (two) times daily as needed. For itching  60 g  1   Scheduled: . amLODipine  10 mg Oral Daily  . aspirin  300 mg Rectal Daily   Or  . aspirin  325 mg Oral Daily  . enoxaparin (LOVENOX) injection  40 mg Subcutaneous Q24H  . losartan  100 mg Oral Daily    ROS: History obtained from the patient  General ROS: negative for - chills, fatigue, fever, night sweats, weight gain or weight loss Psychological ROS: negative for - behavioral disorder, hallucinations, memory difficulties,  mood swings or suicidal ideation Ophthalmic ROS: negative for - blurry vision, double vision, eye pain or loss of vision ENT ROS: negative for - epistaxis, nasal discharge, oral lesions, sore throat, tinnitus or vertigo Allergy and Immunology ROS: negative for - hives or itchy/watery eyes Hematological and Lymphatic ROS: negative for - bleeding problems, bruising or swollen lymph nodes Endocrine ROS: negative for - galactorrhea, hair pattern changes, polydipsia/polyuria or temperature intolerance Respiratory ROS: negative for - cough, hemoptysis, shortness of breath or wheezing Cardiovascular ROS: negative for - chest pain, dyspnea on exertion, edema or irregular heartbeat Gastrointestinal ROS: negative for - abdominal pain, diarrhea, hematemesis, nausea/vomiting or stool incontinence Genito-Urinary ROS: negative for - dysuria, hematuria, incontinence or urinary  frequency/urgency Musculoskeletal ROS: weakness in left leg requiring cane for ambulation Neurological ROS: as noted in HPI Dermatological ROS: negative for rash and skin lesion changes  Physical Examination: Blood pressure 155/61, pulse 80, temperature 98.4 F (36.9 C), resp. rate 19, height 5\' 5"  (1.651 m), weight 84.823 kg (187 lb), SpO2 95.00%.  Neurologic Examination: Mental Status: Alert, oriented, thought content appropriate.  Speech fluent without evidence of aphasia.  Able to follow 3 step commands without difficulty. Cranial Nerves: II: Discs flat bilaterally; Visual fields grossly normal, pupils equal, round, reactive to light and accommodation III,IV, VI: ptosis not present, extra-ocular motions intact bilaterally V,VII: smile symmetric, facial light touch sensation normal bilaterally VIII: hearing normal bilaterally IX,X: gag reflex present XI: bilateral shoulder shrug XII: midline tongue extension Motor: Right : Upper extremity   5/5    Left:     Upper extremity   5/5  Lower extremity   5/5     Lower extremity   5/5 Tone and bulk:normal tone throughout; no atrophy noted Sensory: Light touch decreased in the left hand.  Does not extend to the forearm or upper arm Deep Tendon Reflexes: 2+ in the upper extremities, 1+ at the knees and absent at the ankles.   Plantars: Right: downgoing   Left: downgoing Cerebellar: normal finger-to-nose and normal heel-to-shin test Gait: Unable to test CV: pulses palpable throughout     Laboratory Studies:  Basic Metabolic Panel:  Recent Labs Lab 10/05/13 1747  NA 138  K 4.4  CL 98  CO2 22  GLUCOSE 113*  BUN 27*  CREATININE 1.14*  CALCIUM 9.5    Liver Function Tests:  Recent Labs Lab 10/05/13 1747  AST 16  ALT 8  ALKPHOS 92  BILITOT 0.3  PROT 7.6  ALBUMIN 3.9   No results found for this basename: LIPASE, AMYLASE,  in the last 168 hours No results found for this basename: AMMONIA,  in the last 168  hours  CBC:  Recent Labs Lab 10/05/13 1747  WBC 9.9  NEUTROABS 5.9  HGB 13.4  HCT 38.1  MCV 92.0  PLT 310    Cardiac Enzymes:  Recent Labs Lab 10/05/13 1747  TROPONINI <0.30    BNP: No components found with this basename: POCBNP,   CBG: No results found for this basename: GLUCAP,  in the last 168 hours  Microbiology: Results for orders placed in visit on 12/29/10  TECHNOLOGIST REVIEW     Status: None   Collection Time    12/29/10  9:50 AM      Result Value Ref Range Status   Technologist Review Variant lymphs present   Final    Coagulation Studies: No results found for this basename: LABPROT, INR,  in the last 72 hours  Urinalysis: No results found for  this basename: COLORURINE, APPERANCEUR, LABSPEC, [place], GLUCOSEU, HGBUR, BILIRUBINUR, KETONESUR, PROTEINUR, UROBILINOGEN, NITRITE, LEUKOCYTESUR,  in the last 168 hours  Lipid Panel:    Component Value Date/Time   CHOL 192 10/24/2011 0335   TRIG 42 10/24/2011 0335   HDL 52 10/24/2011 0335   CHOLHDL 3.7 10/24/2011 0335   VLDL 8 10/24/2011 0335   LDLCALC 132* 10/24/2011 0335    HgbA1C:  No results found for this basename: HGBA1C    Urine Drug Screen:   No results found for this basename: labopia, cocainscrnur, labbenz, amphetmu, thcu, labbarb    Alcohol Level: No results found for this basename: ETH,  in the last 168 hours  Other results: EKG: atrial fibrillation at 64 bpm.  Imaging: Ct Head Wo Contrast  10/05/2013   CLINICAL DATA:  Left hand weakness, hypertension  EXAM: CT HEAD WITHOUT CONTRAST  TECHNIQUE: Contiguous axial images were obtained from the base of the skull through the vertex without intravenous contrast.  COMPARISON:  None.  FINDINGS: Negative for acute intracranial hemorrhage, acute infarction, hydrocephalus or midline shift. Gray-white differentiation is preserved throughout. A partially calcified, extra-axial dural-based mass is identified along the lateral aspect of the left posterior  cranial fossa. The mass measures 1.8 x 2.6 cm in greatest dimension. There is mild local mass of fact on the cerebellum but no evidence of underlying edema. Mild global cerebral volume loss is commensurate with age. Mild periventricular white matter hypoattenuation is most consistent with the sequelae of longstanding microvascular ischemia. No focal soft tissue or calvarial abnormality. The globes and orbits are intact and unremarkable bilaterally. Normal aeration of the mastoid air cells atherosclerotic calcifications noted in the bilateral cavernous carotid arteries.  IMPRESSION: 1. No acute intracranial abnormality. 2. A 2.6 x 1.8 cm extra-axial dural-based and partially calcified mass in the lateral aspect of the left posterior cranial fossa almost certainly represents a benign meningioma. There is mild local mass effect on the adjacent left cerebellar hemisphere but no evidence of associated edema or other acute abnormality. 3. Mild global cerebral atrophy and chronic microvascular ischemic white matter disease. 4. Intracranial atherosclerosis.   Electronically Signed   By: Jacqulynn Cadet M.D.   On: 10/05/2013 18:25    Assessment: 80 y.o. female presenting with confusion and left hand numbness.  Atrial fibrillation noted on telemetry.  Patient on no antiplatelet therapy or anticoagulation at home.  Head CT reviewed and shows no acute changes.  Further work up recommended.    Stroke Risk Factors - atrial fibrillation and hypertension  Plan: 1. HgbA1c, fasting lipid panel 2. MRI, MRA  of the brain without contrast 3. PT consult, OT consult, Speech consult 4. Echocardiogram 5. Carotid dopplers 6. Prophylactic therapy-Antiplatelet med: Aspirin - dose 325mg  daily.  Determination to be made for need for anticoagulation based on results of above work up.   7. Telemetry monitoring 8. Frequent neuro checks   Alexis Goodell, MD [facility] [phone] 10/05/2013, 8:32 PM

## 2013-10-05 NOTE — ED Notes (Signed)
Neurology at bedside.

## 2013-10-05 NOTE — ED Notes (Signed)
Pt returned from CT °

## 2013-10-05 NOTE — ED Notes (Signed)
Pt sent  Down from ucc

## 2013-10-05 NOTE — ED Notes (Signed)
The pt is alert no pain moves all extremities

## 2013-10-06 ENCOUNTER — Inpatient Hospital Stay (HOSPITAL_COMMUNITY): Payer: Medicare Other

## 2013-10-06 ENCOUNTER — Telehealth: Payer: Self-pay | Admitting: Family Medicine

## 2013-10-06 DIAGNOSIS — D320 Benign neoplasm of cerebral meninges: Secondary | ICD-10-CM | POA: Diagnosis not present

## 2013-10-06 DIAGNOSIS — I4891 Unspecified atrial fibrillation: Secondary | ICD-10-CM | POA: Diagnosis not present

## 2013-10-06 DIAGNOSIS — I6350 Cerebral infarction due to unspecified occlusion or stenosis of unspecified cerebral artery: Secondary | ICD-10-CM | POA: Diagnosis not present

## 2013-10-06 DIAGNOSIS — R269 Unspecified abnormalities of gait and mobility: Secondary | ICD-10-CM | POA: Diagnosis present

## 2013-10-06 DIAGNOSIS — I672 Cerebral atherosclerosis: Secondary | ICD-10-CM | POA: Diagnosis not present

## 2013-10-06 DIAGNOSIS — R209 Unspecified disturbances of skin sensation: Secondary | ICD-10-CM | POA: Diagnosis not present

## 2013-10-06 DIAGNOSIS — I6529 Occlusion and stenosis of unspecified carotid artery: Secondary | ICD-10-CM | POA: Diagnosis not present

## 2013-10-06 DIAGNOSIS — I517 Cardiomegaly: Secondary | ICD-10-CM

## 2013-10-06 LAB — HEMOGLOBIN A1C
Hgb A1c MFr Bld: 6.3 % — ABNORMAL HIGH (ref <5.7)
Mean Plasma Glucose: 134 mg/dL — ABNORMAL HIGH (ref <117)

## 2013-10-06 LAB — LIPID PANEL
CHOL/HDL RATIO: 3.9 ratio
Cholesterol: 186 mg/dL (ref 0–200)
HDL: 48 mg/dL (ref >39)
LDL Cholesterol: 128 mg/dL — ABNORMAL HIGH (ref 0–99)
Triglycerides: 52 mg/dL (ref <150)
VLDL: 10 mg/dL (ref 0–40)

## 2013-10-06 LAB — TROPONIN I: Troponin I: <0.3 ng/mL (ref <0.30)

## 2013-10-06 LAB — TSH: TSH: 4.574 u[IU]/mL — AB (ref 0.350–4.500)

## 2013-10-06 MED ORDER — ATORVASTATIN CALCIUM 10 MG PO TABS
10.0000 mg | ORAL_TABLET | Freq: Every day | ORAL | Status: DC
  Administered 2013-10-06 – 2013-10-07 (×2): 10 mg via ORAL
  Filled 2013-10-06 (×2): qty 1

## 2013-10-06 MED ORDER — GADOBENATE DIMEGLUMINE 529 MG/ML IV SOLN
15.0000 mL | Freq: Once | INTRAVENOUS | Status: AC
  Administered 2013-10-06: 15 mL via INTRAVENOUS

## 2013-10-06 NOTE — Progress Notes (Addendum)
NEURO HOSPITALIST PROGRESS NOTE   SUBJECTIVE:                                                                                                                         Patient continues to have left hand tingling sensation but shows no weakness. No other complaints.  OBJECTIVE:                                                                                                                           Vital signs in last 24 hours: Temp:  [97.7 F (36.5 C)-99.5 F (37.5 C)] 97.8 F (36.6 C) (03/30 1027) Pulse Rate:  [60-80] 69 (03/30 1027) Resp:  [14-22] 18 (03/30 1027) BP: (122-166)/(42-75) 141/56 mmHg (03/30 1027) SpO2:  [93 %-99 %] 97 % (03/30 1027) Weight:  [83.371 kg (183 lb 12.8 oz)-84.823 kg (187 lb)] 83.371 kg (183 lb 12.8 oz) (03/29 2045)  Intake/Output from previous day: 03/29 0701 - 03/30 0700 In: 2640 [P.O.:2640] Out: 450 [Urine:450] Intake/Output this shift:   Nutritional status: Cardiac  Past Medical History  Diagnosis Date  . Arthritis   . Hemorrhoid   . Cataract   . Cancer     Breast  . Hypertension   . Hemochromatosis     Followed by  hematology;  . Colon polyp   . Idiopathic hemochromatosis 09/28/2011  . Pulmonary hypertension   . Edema of both legs   . Lichen sclerosus   . Osteoporosis 2005     Neurologic Exam:  Mental Status: Alert, oriented, thought content appropriate.  Speech fluent without evidence of aphasia.  Able to follow 3 step commands without difficulty. Cranial Nerves: II: Discs flat bilaterally; Visual fields grossly normal, pupils equal, round, reactive to light and accommodation III,IV, VI: ptosis not present, extra-ocular motions intact bilaterally V,VII: smile symmetric, facial light touch sensation normal bilaterally VIII: hearing normal bilaterally IX,X: gag reflex present XI: bilateral shoulder shrug XII: midline tongue extension without atrophy or fasciculations  Motor: Right : Upper  extremity   5/5    Left:     Upper extremity   5/5  Lower extremity   5/5     Lower extremity   5/5 Tone and bulk:normal tone throughout; no atrophy noted  Sensory: Pinprick and light touch intact throughout, bilaterally Deep Tendon Reflexes:  Right: Upper Extremity   Left: Upper extremity   biceps (C-5 to C-6) 2/4   biceps (C-5 to C-6) 2/4 tricep (C7) 2/4    triceps (C7) 2/4 Brachioradialis (C6) 2/4  Brachioradialis (C6) 2/4  Lower Extremity Lower Extremity  quadriceps (L-2 to L-4) 1/4   quadriceps (L-2 to L-4) 1/4 Achilles (S1) 0/4   Achilles (S1) 0/4  Plantars: Right: downgoing   Left: downgoing Cerebellar: normal finger-to-nose,  normal heel-to-shin test    Lab Results: Basic Metabolic Panel:  Recent Labs Lab 10/05/13 1747  NA 138  K 4.4  CL 98  CO2 22  GLUCOSE 113*  BUN 27*  CREATININE 1.14*  CALCIUM 9.5    Liver Function Tests:  Recent Labs Lab 10/05/13 1747  AST 16  ALT 8  ALKPHOS 92  BILITOT 0.3  PROT 7.6  ALBUMIN 3.9   No results found for this basename: LIPASE, AMYLASE,  in the last 168 hours No results found for this basename: AMMONIA,  in the last 168 hours  CBC:  Recent Labs Lab 10/05/13 1747  WBC 9.9  NEUTROABS 5.9  HGB 13.4  HCT 38.1  MCV 92.0  PLT 310    Cardiac Enzymes:  Recent Labs Lab 10/05/13 1747 10/06/13 0007  TROPONINI <0.30 <0.30    Lipid Panel:  Recent Labs Lab 10/06/13 0007  CHOL 186  TRIG 52  HDL 48  CHOLHDL 3.9  VLDL 10  LDLCALC 128*    CBG: No results found for this basename: GLUCAP,  in the last 168 hours  Microbiology: Results for orders placed in visit on 12/29/10  TECHNOLOGIST REVIEW     Status: None   Collection Time    12/29/10  9:50 AM      Result Value Ref Range Status   Technologist Review Variant lymphs present   Final    Coagulation Studies: No results found for this basename: LABPROT, INR,  in the last 72 hours  Imaging: Dg Chest 2 View  10/06/2013   CLINICAL DATA:  Stroke,  left arm numbness  EXAM: CHEST  2 VIEW  COMPARISON:  10/23/2011  FINDINGS: Cardiomediastinal silhouette is stable. No acute infiltrate or pulmonary edema. Stable atelectasis or scarring in right middle lobe. Surgical clips in right axilla.  IMPRESSION: No active cardiopulmonary disease.   Electronically Signed   By: Lahoma Crocker M.D.   On: 10/06/2013 07:58   Ct Head Wo Contrast  10/05/2013   CLINICAL DATA:  Left hand weakness, hypertension  EXAM: CT HEAD WITHOUT CONTRAST  TECHNIQUE: Contiguous axial images were obtained from the base of the skull through the vertex without intravenous contrast.  COMPARISON:  None.  FINDINGS: Negative for acute intracranial hemorrhage, acute infarction, hydrocephalus or midline shift. Gray-white differentiation is preserved throughout. A partially calcified, extra-axial dural-based mass is identified along the lateral aspect of the left posterior cranial fossa. The mass measures 1.8 x 2.6 cm in greatest dimension. There is mild local mass of fact on the cerebellum but no evidence of underlying edema. Mild global cerebral volume loss is commensurate with age. Mild periventricular white matter hypoattenuation is most consistent with the sequelae of longstanding microvascular ischemia. No focal soft tissue or calvarial abnormality. The globes and orbits are intact and unremarkable bilaterally. Normal aeration of the mastoid air cells atherosclerotic calcifications noted in the bilateral cavernous carotid arteries.  IMPRESSION: 1. No acute intracranial abnormality. 2. A 2.6 x 1.8 cm extra-axial dural-based  and partially calcified mass in the lateral aspect of the left posterior cranial fossa almost certainly represents a benign meningioma. There is mild local mass effect on the adjacent left cerebellar hemisphere but no evidence of associated edema or other acute abnormality. 3. Mild global cerebral atrophy and chronic microvascular ischemic white matter disease. 4. Intracranial  atherosclerosis.   Electronically Signed   By: Jacqulynn Cadet M.D.   On: 10/05/2013 18:25   Mr Jodene Nam Head/brain Wo Cm  10/06/2013   CLINICAL DATA:  Left hand numbness and possible stroke. Posterior fossa mass on CT.  EXAM: MRI HEAD WITHOUT AND WITH CONTRAST  MRA HEAD WITHOUT CONTRAST  TECHNIQUE: Multiplanar, multiecho pulse sequences of the brain and surrounding structures were obtained without and with intravenous contrast. Angiographic images of the head were obtained using MRA technique without contrast.  CONTRAST:  70mL MULTIHANCE GADOBENATE DIMEGLUMINE 529 MG/ML IV SOLN  COMPARISON:  CT HEAD W/O CM dated 10/05/2013  FINDINGS: MRI HEAD FINDINGS  Somewhat linear foci of mildly increased diffusion weighted signal are present in the thalami bilaterally without definite restricted diffusion on the ADC map. There is associated mild T2 hyperintensity in these locations. Age-related cerebral atrophy is present. Patchy T2 hyperintensities in the periventricular white matter are nonspecific but compatible with mild chronic small vessel ischemic disease. There is no midline shift or evidence of intracranial hemorrhage. There is no extra-axial fluid collection.  Enhancing mass in the left posterior fossa lateral to the cerebellum measures 2.7 x 2.2 x 2.4 cm. Central portion of the mass which is nonenhancing may be necrotic. The mass interfaces with the underneath side of the tentorium. The mass is also in close proximity to the transverse/sigmoid sinus junction, however no gross invasion is identified. There is no significant edema within the overlying cerebellum. No other enhancing lesions are identified.  Prior bilateral cataract surgery is noted. No significant paranasal sinus mucosal disease is identified. Mastoid air cells are clear.  MRA HEAD FINDINGS  The visualized distal vertebral arteries are patent with the right vertebral artery being dominant. A portion of the proximal intracranial left vertebral artery is  not well evaluated, possibly due to motion. PICA origins are patent bilaterally. AICA and SCA origins are patent. Basilar artery is patent without stenosis. There is severe stenosis of the right PCA origin. Distal to this stenosis, the right P1 and proximal P2 segments appear patent, however there is occlusion of more distal right PCA branches. The left PCA is unremarkable.  Internal carotid arteries are patent from skullbase to carotid termini. There is a 2 x 2 mm left supraclinoid ICA aneurysm. ACAs and MCAs are unremarkable.  IMPRESSION: 1. Ischemic changes in the thalami, possibly subacute. 2. Severe stenosis of the right PCA origin with occlusion of P2 branches. 3. Extra-axial mass in the left posterior fossa, consistent with a meningioma. 4. 2 mm left supraclinoid ICA aneurysm.   Electronically Signed   By: Logan Bores   On: 10/06/2013 08:40       MEDICATIONS  Scheduled: . amLODipine  10 mg Oral Daily  . aspirin  300 mg Rectal Daily   Or  . aspirin  325 mg Oral Daily  . enoxaparin (LOVENOX) injection  40 mg Subcutaneous Q24H  . losartan  100 mg Oral Daily    ASSESSMENT/PLAN:                                                                                                             80 YO female with confusion and left hand numbness. Confusion has cleared and MRI shows schemic changes in the thalami, possibly subacute as well as an extra-axial mass in the left posterior fossa, consistent with a  Meningioma. MRA brain showed a 4. 2 mm left supraclinoid ICA aneurysm. LDL elevated at 128. At present time would continue ASA daily.  Echo and carotid doppler pending. A1c pending. Will continue to follow.  Assessment and plan discussed with with attending physician and they are in agreement.    Etta Quill PA-C [facility] [phone]  10/06/2013, 10:31 AM    Patient seen and examined together with physician assistant and I concur with the assessment and plan.  Dorian Pod, MD

## 2013-10-06 NOTE — Progress Notes (Addendum)
Chart reviewed.   [facility] PROGRESS NOTE  Tammy Lloyd GHW:[id number] DOB: [birth year] DOA: 10/05/2013 PCP: [name], MD  Assessment/Plan:  Principal Problem:   Numbness of left hand:  MRI shows subacute bilateral thalamic infarcts. Await OT evaluation. Discussed PT. She feels patient is a fall risk. Patient lives alone and would not want placement. She is not too keen on the idea of an aide or physical therapy, but she does agree to arranging home health services at least short-term. Daughter lives half-an-hour away. She has a life alert necklace. We will arrange home services and home social work as well. Start statin, as LDL above 100. Not a candidate for anticoagulation due to fall risk. Continue aspirin. Discussed with neurology. Echocardiogram and carotid Dopplers okay. Active Problems:   Hypertension   Meningioma   Atrial fibrillation, new diagnosis. See above.   Abnormality of gait/fall risk. Will order her rolling walker. 2 mm left supraclinoid ICA aneurysm.   Code Status:  full Family Communication:  Daughter at bedside Disposition Plan:  home with home health tomorrow if stable.  Consultants:  Neurology  Procedures:     Antibiotics:    HPI/Subjective: Still with some left hand weakness, particularly involving the third fourth and fifth fingers. She writes with her right hand, uses scissors with her left hand.  Objective: Filed Vitals:   10/06/13 1027  BP: 141/56  Pulse: 69  Temp: 97.8 F (36.6 C)  Resp: 18    Intake/Output Summary (Last 24 hours) at 10/06/13 1334 Last data filed at 10/06/13 1100  Gross per 24 hour  Intake   3000 ml  Output    450 ml  Net   2550 ml   Filed Weights   10/05/13 1704 10/05/13 2045  Weight: 84.823 kg (187 lb) 83.371 kg (183 lb 12.8 oz)   Telemetry: Atrial fibrillation, rate controlled  Exam:   General:  Alert, oriented  Cardiovascular: Irregularly irregular without murmurs gallops  rubs  Respiratory: Clear to auscultation bilaterally without wheezes rhonchi or rales  Abdomen:  Soft nontender nondistended  Ext: No clubbing cyanosis or edema  Neurologic: Cranial nerves intact. Motor strength 5 out of 5.  Basic Metabolic Panel:  Recent Labs Lab 10/05/13 1747  NA 138  K 4.4  CL 98  CO2 22  GLUCOSE 113*  BUN 27*  CREATININE 1.14*  CALCIUM 9.5   Liver Function Tests:  Recent Labs Lab 10/05/13 1747  AST 16  ALT 8  ALKPHOS 92  BILITOT 0.3  PROT 7.6  ALBUMIN 3.9   No results found for this basename: LIPASE, AMYLASE,  in the last 168 hours No results found for this basename: AMMONIA,  in the last 168 hours CBC:  Recent Labs Lab 10/05/13 1747  WBC 9.9  NEUTROABS 5.9  HGB 13.4  HCT 38.1  MCV 92.0  PLT 310   Cardiac Enzymes:  Recent Labs Lab 10/05/13 1747 10/06/13 0007  TROPONINI <0.30 <0.30   BNP (last 3 results) No results found for this basename: PROBNP,  in the last 8760 hours CBG: No results found for this basename: GLUCAP,  in the last 168 hours  No results found for this or any previous visit (from the past 240 hour(s)).   Studies: Dg Chest 2 View  10/06/2013   CLINICAL DATA:  Stroke, left arm numbness  EXAM: CHEST  2 VIEW  COMPARISON:  10/23/2011  FINDINGS: Cardiomediastinal silhouette is stable. No acute infiltrate or pulmonary edema. Stable atelectasis or scarring in right middle  lobe. Surgical clips in right axilla.  IMPRESSION: No active cardiopulmonary disease.   Electronically Signed   By: Lahoma Crocker M.D.   On: 10/06/2013 07:58   Ct Head Wo Contrast  10/05/2013   CLINICAL DATA:  Left hand weakness, hypertension  EXAM: CT HEAD WITHOUT CONTRAST  TECHNIQUE: Contiguous axial images were obtained from the base of the skull through the vertex without intravenous contrast.  COMPARISON:  None.  FINDINGS: Negative for acute intracranial hemorrhage, acute infarction, hydrocephalus or midline shift. Gray-white differentiation is  preserved throughout. A partially calcified, extra-axial dural-based mass is identified along the lateral aspect of the left posterior cranial fossa. The mass measures 1.8 x 2.6 cm in greatest dimension. There is mild local mass of fact on the cerebellum but no evidence of underlying edema. Mild global cerebral volume loss is commensurate with age. Mild periventricular white matter hypoattenuation is most consistent with the sequelae of longstanding microvascular ischemia. No focal soft tissue or calvarial abnormality. The globes and orbits are intact and unremarkable bilaterally. Normal aeration of the mastoid air cells atherosclerotic calcifications noted in the bilateral cavernous carotid arteries.  IMPRESSION: 1. No acute intracranial abnormality. 2. A 2.6 x 1.8 cm extra-axial dural-based and partially calcified mass in the lateral aspect of the left posterior cranial fossa almost certainly represents a benign meningioma. There is mild local mass effect on the adjacent left cerebellar hemisphere but no evidence of associated edema or other acute abnormality. 3. Mild global cerebral atrophy and chronic microvascular ischemic white matter disease. 4. Intracranial atherosclerosis.   Electronically Signed   By: Jacqulynn Cadet M.D.   On: 10/05/2013 18:25   Mr Jodene Nam Head/brain Wo Cm  10/06/2013   CLINICAL DATA:  Left hand numbness and possible stroke. Posterior fossa mass on CT.  EXAM: MRI HEAD WITHOUT AND WITH CONTRAST  MRA HEAD WITHOUT CONTRAST  TECHNIQUE: Multiplanar, multiecho pulse sequences of the brain and surrounding structures were obtained without and with intravenous contrast. Angiographic images of the head were obtained using MRA technique without contrast.  CONTRAST:  49mL MULTIHANCE GADOBENATE DIMEGLUMINE 529 MG/ML IV SOLN  COMPARISON:  CT HEAD W/O CM dated 10/05/2013  FINDINGS: MRI HEAD FINDINGS  Somewhat linear foci of mildly increased diffusion weighted signal are present in the thalami  bilaterally without definite restricted diffusion on the ADC map. There is associated mild T2 hyperintensity in these locations. Age-related cerebral atrophy is present. Patchy T2 hyperintensities in the periventricular white matter are nonspecific but compatible with mild chronic small vessel ischemic disease. There is no midline shift or evidence of intracranial hemorrhage. There is no extra-axial fluid collection.  Enhancing mass in the left posterior fossa lateral to the cerebellum measures 2.7 x 2.2 x 2.4 cm. Central portion of the mass which is nonenhancing may be necrotic. The mass interfaces with the underneath side of the tentorium. The mass is also in close proximity to the transverse/sigmoid sinus junction, however no gross invasion is identified. There is no significant edema within the overlying cerebellum. No other enhancing lesions are identified.  Prior bilateral cataract surgery is noted. No significant paranasal sinus mucosal disease is identified. Mastoid air cells are clear.  MRA HEAD FINDINGS  The visualized distal vertebral arteries are patent with the right vertebral artery being dominant. A portion of the proximal intracranial left vertebral artery is not well evaluated, possibly due to motion. PICA origins are patent bilaterally. AICA and SCA origins are patent. Basilar artery is patent without stenosis. There is severe stenosis of  the right PCA origin. Distal to this stenosis, the right P1 and proximal P2 segments appear patent, however there is occlusion of more distal right PCA branches. The left PCA is unremarkable.  Internal carotid arteries are patent from skullbase to carotid termini. There is a 2 x 2 mm left supraclinoid ICA aneurysm. ACAs and MCAs are unremarkable.  IMPRESSION: 1. Ischemic changes in the thalami, possibly subacute. 2. Severe stenosis of the right PCA origin with occlusion of P2 branches. 3. Extra-axial mass in the left posterior fossa, consistent with a meningioma.  4. 2 mm left supraclinoid ICA aneurysm.   Electronically Signed   By: Logan Bores   On: 10/06/2013 08:40    Scheduled Meds: . amLODipine  10 mg Oral Daily  . aspirin  300 mg Rectal Daily   Or  . aspirin  325 mg Oral Daily  . enoxaparin (LOVENOX) injection  40 mg Subcutaneous Q24H  . losartan  100 mg Oral Daily   Continuous Infusions: . sodium chloride      Time spent: 35 minutes  [facility] Pager [phone]. If 7PM-7AM, please contact night-coverage at [email or web address], password [facility] 10/06/2013, 1:34 PM  LOS: 1 day

## 2013-10-06 NOTE — Evaluation (Signed)
Occupational Therapy Evaluation Patient Details Name: Tammy Lloyd MRN: [id number] DOB: 12-09-1923 Today's Date: 10/06/2013    History of Present Illness 80 y.o. s/p subacute bilateral thalamic infarcts   Clinical Impression   Pt presents with below problem list. Pt independent with ADLs, PTA. Feel pt will benefit from acute OT to increase independence prior to d/c. Pt with apparent field cut in left superior quadrant when OT tested vision. Recommended no driving and to get a full visual field assessment completed.      Follow Up Recommendations  Home health OT;Supervision - Intermittent (also to get a full visual field assessment at opthamologist)   Equipment Recommendations  3 in 1 bedside comode    Recommendations for Other Services       Precautions / Restrictions Precautions Precautions: Fall Restrictions Weight Bearing Restrictions: No      Mobility Bed Mobility Overal bed mobility: Modified Independent                Transfers Overall transfer level: Needs assistance Equipment used: Rolling walker (2 wheeled) Transfers: Sit to/from Stand Sit to Stand: Supervision;Min guard         General transfer comment: cues for technique    Balance                                    ADL       Upper Body Dressing : Set up;Sitting   Lower Body Dressing: Sit to/from stand;Supervision/safety Toilet Transfer: Min guard;Ambulation;Regular Toilet (3 in 1 over commode)   Tub/ Shower Transfer: Min guard;Ambulation;Tub bench;Rolling walker Functional mobility during ADLs: Min guard General ADL Comments: Educated on signs/symptoms of stroke and also discussed canned foods. Practiced tub transfer on tub bench. Recommended sitting for bathing. Discussed what OT saw in vision assessment and recommended NO driving and for pt to get a full visual field assessment completed. Pt reports she sees fine and seems to think it is okay for her to drive. OT explained  safey issues with this.     Vision       Tracking/Visual Pursuits: Able to track stimulus in all quads without difficulty Saccades: Within functional limits           Perception     Praxis      Pertinent Vitals/Pain No pain reported.      Hand Dominance  (writes right but everything else left)   Extremity/Trunk Assessment Upper Extremity Assessment Upper Extremity Assessment: LUE deficits/detail LUE Sensation: decreased light touch   Lower Extremity Assessment Lower Extremity Assessment: Defer to PT evaluation       Communication Communication Communication: No difficulties   Cognition Arousal/Alertness: Awake/alert Behavior During Therapy: WFL for tasks assessed/performed Overall Cognitive Status: Within Functional Limits for tasks assessed                     General Comments       Exercises      Home Living Family/patient expects to be discharged to:: Private residence Living Arrangements: Alone Available Help at Discharge: Family Type of Home: House Home Access: Stairs to enter Technical brewer of Steps: 5 Entrance Stairs-Rails: Right Home Layout: One level         Biochemist, clinical: Standard     Home Equipment: Walker - standard;Shower seat;Cane - single point   Additional Comments: pt indicates her daughter lives more than 76mins from her.  Lives With: Alone    Prior Functioning/Environment Level of Independence: Independent with assistive device(s)        Comments: pt had been using a cane in her L hand to support her L LE.      OT Diagnosis:     OT Problem List: Impaired vision/perception;Decreased safety awareness;Decreased knowledge of use of DME or AE;Impaired sensation;Decreased strength   OT Treatment/Interventions: Self-care/ADL training;DME and/or AE instruction;Therapeutic activities;Visual/perceptual remediation/compensation;Patient/family education;Balance training    OT Goals(Current goals can be found  in the care plan section) Acute Rehab OT Goals Patient Stated Goal: not stated OT Goal Formulation: With patient Time For Goal Achievement: 10/13/13 Potential to Achieve Goals: Good ADL Goals Pt Will Transfer to Toilet: with modified independence;ambulating (3 in 1 over commode) Additional ADL Goal #1: Pt will be agreeable to get a full visual field assessment and restrain from driving until she gets test.   OT Frequency: Min 2X/week   Barriers to D/C: Decreased caregiver support          End of Session: Equipment Utilized During Treatment: Gait belt;Rolling walker  Activity Tolerance: Patient tolerated treatment well Patient left: in bed;with call bell/phone within reach;with bed alarm set;with family/visitor present   Time: 0102-7253 OT Time Calculation (min): 30 min Charges:  OT General Charges $OT Visit: 1 Procedure OT Evaluation $Initial OT Evaluation Tier I: 1 Procedure OT Treatments $Therapeutic Activity: 8-22 mins G-Codes: OT G-codes **NOT FOR INPATIENT CLASS** Functional Assessment Tool Used: clinical judgment Functional Limitation: Self care Self Care Current Status (G6440): At least 1 percent but less than 20 percent impaired, limited or restricted Self Care Goal Status (H4742): 0 percent impaired, limited or restricted  Benito Mccreedy OTR/L [phone] 10/06/2013, 5:45 PM

## 2013-10-06 NOTE — Progress Notes (Signed)
UR complete.  Cloyce Blankenhorn RN, MSN 

## 2013-10-06 NOTE — Evaluation (Signed)
Physical Therapy Evaluation Patient Details Name: Tammy Lloyd MRN: [id number] DOB: 1924-05-03 Today's Date: 10/06/2013   History of Present Illness  pt presents with L hand diminished sensation.    Clinical Impression  Pt is generally unsteady and question how much of this is baseline.  Pt ed on proper use of cane, however question if it is too difficult to try to coordinate this for pt.  Feel pt would need A at home and unsure if family can provide any.  May benefit from discussing ALF level of care for pt instead of remaining home alone.  At this time would benefit from [place], [place], and [place].  Will continue to follow.      Follow Up Recommendations Home health PT;Supervision - Intermittent ([facility])    Equipment Recommendations  Rolling walker with 5" wheels    Recommendations for Other Services       Precautions / Restrictions Precautions Precautions: Fall Restrictions Weight Bearing Restrictions: No      Mobility  Bed Mobility Overal bed mobility: Modified Independent             General bed mobility comments: Needs increased time, but able to complete without A.    Transfers Overall transfer level: Needs assistance Equipment used: 4-wheeled walker;Straight cane Transfers: Sit to/from Stand Sit to Stand: Supervision         General transfer comment: pt with uncontrolled descent to sitting and states she does this when she gets tired.    Ambulation/Gait Ambulation/Gait assistance: Min guard Ambulation Distance (Feet): 200 Feet Assistive device: Rolling walker (2 wheeled);Straight cane Gait Pattern/deviations: Step-through pattern;Decreased stride length;Trunk flexed   Gait velocity interpretation: Below normal speed for age/gender General Gait Details: Amb 100' with cane and MinA as pt mildly unsteady and occasionally uses hand rail in hallway.  pt normally holds cane in her L hand and states she started using it when L knee became weak.  pt ed on proper use of  cane, though question at this point if pt will be able to coordinate switching hands with her cane.  Utilized RW for return to room and pt was MinG.    Stairs            Wheelchair Mobility    Modified Rankin (Stroke Patients Only) Modified Rankin (Stroke Patients Only) Pre-Morbid Rankin Score: Slight disability Modified Rankin: Moderately severe disability     Balance Overall balance assessment: Needs assistance         Standing balance support: Single extremity supported Standing balance-Leahy Scale: Poor Standing balance comment: pt needs at least one UE support to maintain balance or leans against counter top.                       Pertinent Vitals/Pain Indicates chronic L knee pain.      Home Living Family/patient expects to be discharged to:: Private residence Living Arrangements: Alone Available Help at Discharge: Family Type of Home: House Home Access: Stairs to enter Entrance Stairs-Rails: Right Entrance Stairs-Number of Steps: 5 Home Layout: One level Home Equipment: Walker - standard;Shower seat;Cane - single point Additional Comments: pt indicates her daughter lives more than 32mins from her.      Prior Function Level of Independence: Independent with assistive device(s)         Comments: pt had been using a cane in her L hand to support her L LE.       Hand Dominance   Dominant Hand:  (Writes R,  but everything else L.  )    Extremity/Trunk Assessment   Upper Extremity Assessment: Defer to OT evaluation           Lower Extremity Assessment: Generalized weakness      Cervical / Trunk Assessment: Kyphotic  Communication   Communication: No difficulties  Cognition Arousal/Alertness: Awake/alert Behavior During Therapy: WFL for tasks assessed/performed Overall Cognitive Status: Within Functional Limits for tasks assessed                      General Comments      Exercises        Assessment/Plan    PT  Assessment Patient needs continued PT services  PT Diagnosis Difficulty walking;Generalized weakness   PT Problem List Decreased strength;Decreased activity tolerance;Decreased balance;Decreased mobility;Decreased coordination;Decreased cognition;Decreased knowledge of use of DME;Impaired sensation  PT Treatment Interventions DME instruction;Gait training;Stair training;Functional mobility training;Therapeutic activities;Therapeutic exercise;Balance training;Neuromuscular re-education;Patient/family education   PT Goals (Current goals can be found in the Care Plan section) Acute Rehab PT Goals Patient Stated Goal: Home PT Goal Formulation: With patient Time For Goal Achievement: 10/20/13 Potential to Achieve Goals: Good    Frequency Min 4X/week   Barriers to discharge Decreased caregiver support pt notes family does not live near her.     End of Session Equipment Utilized During Treatment: Gait belt Activity Tolerance: Patient tolerated treatment well Patient left: in chair;with call bell/phone within reach         Time: 0904-0929 PT Time Calculation (min): 25 min   Charges:   PT Evaluation $Initial PT Evaluation Tier I: 1 Procedure PT Treatments $Gait Training: 8-22 mins   PT G CodesCatarina Lloyd, [place] 10/06/2013, 10:59 AM

## 2013-10-06 NOTE — Progress Notes (Signed)
*  PRELIMINARY RESULTS* Vascular Ultrasound Carotid Duplex (Doppler) has been completed.   Findings suggest 1-39% internal carotid artery stenosis bilaterally. Vertebral arteries are patent with antegrade flow.  10/06/2013 12:17 PM Maudry Mayhew, RVT, RDCS, RDMS

## 2013-10-06 NOTE — Care Management Note (Signed)
    Page 1 of 2   10/06/2013     3:35:33 PM   CARE MANAGEMENT NOTE 10/06/2013  Patient:  Tammy Lloyd, Tammy Lloyd   Account Number:  [id number]  Date Initiated:  10/06/2013  Documentation initiated by:  ROBARGE,COURTNEY  Subjective/Objective Assessment:   Patient admitted with stroke-like symptoms.  Lives at home alone.     Action/Plan:   Will follow for discharge needs.   Anticipated DC Date:  10/07/2013   Anticipated DC Plan:  [place]  CM consult      Choice offered to / List presented to:  C-4 Adult Children   DME arranged  WALKER      DME agency  [place] arranged  [place] agency  [place]   Status of service:  Completed, signed off Medicare Important Message given?   (If response is "NO", the following Medicare IM given date fields will be blank) Date Medicare IM given:   Date Additional Medicare IM given:    Discharge Disposition:    Per UR Regulation:  Reviewed for med. necessity/level of care/duration of stay  If discussed at [place] of Stay Meetings, dates discussed:    Comments:  10/06/13 [place], MSN, CM- Met with patient and daughter to discuss home health needs.  Patient is agreeable to home health.  Patient's daughter has chosen [employer], which the family has used in the past.  Stanton Kidney with [facility] was notified and has accepted the referral. [facility] DME was notified of need for rolling walker for discharge home tomorrow.

## 2013-10-06 NOTE — Evaluation (Signed)
Speech Language Pathology Evaluation Patient Details Name: Tammy Lloyd MRN: [id number] DOB: Jun 06, 1924 Today's Date: 10/06/2013 Time: 70-     Problem List:  Patient Active Problem List   Diagnosis Date Noted  . Abnormality of gait 10/06/2013  . Acute CVA (cerebrovascular accident) 10/05/2013  . Numbness of left hand 10/05/2013  . Meningioma 10/05/2013  . Atrial fibrillation 10/05/2013  . Acute coronary syndrome 10/23/2011  . Idiopathic hemochromatosis 09/28/2011  . GERD (gastroesophageal reflux disease) 09/06/2011  . Hypertension 12/19/2010  . Arthritis 12/19/2010  . History of breast cancer 12/19/2010   Past Medical History:  Past Medical History  Diagnosis Date  . Arthritis   . Hemorrhoid   . Cataract   . Cancer     Breast  . Hypertension   . Hemochromatosis     Followed by  hematology;  . Colon polyp   . Idiopathic hemochromatosis 09/28/2011  . Pulmonary hypertension   . Edema of both legs   . Lichen sclerosus   . Osteoporosis 2005   Past Surgical History:  Past Surgical History  Procedure Laterality Date  . Colonoscopy  2009  . Total knee arthroplasty  2006  . Dilation and curettage of uterus  2005  . Cataract extraction, bilateral    . Breast lumpectomy      Right  . Back surgery  2008  . Hysteroscopy  08/2003   HPI:  Tammy Lloyd is a 80 y.o. female with history of breast cancer, hypertension, hemochromatosis was brought to the ER after patient's daughter found patient confused and was complaining of left hand numbness. CT revealed a benign meningioma; MRI: Ischemic changes in the thalami, possibly subacute;  2 mm left supraclinoid ICA aneurysm.Marland Kitchen  SLP was consulted for Speech -Language evaluation as part of the Stroke team.   Assessment / Plan / Recommendation Clinical Impression  Patient appears to have normal speech, language, and cognitive function for her age.  Pt. and family agree.     SLP Assessment  Patient does not need any further Speech  [place] [facility]    Follow Up Recommendations       Frequency and Duration        Pertinent Vitals/Pain n/a   SLP Goals  SLP Goals Progress/Goals/Alternative treatment plan discussed with pt/caregiver and they: Agree  SLP Evaluation Prior Functioning  Cognitive/Linguistic Baseline: Within functional limits Type of Home: House  Lives With: Alone Available Help at Discharge: Family Vocation: Retired   Associate Professor  Overall Cognitive Status: Within Functional Limits for tasks assessed Arousal/Alertness: Awake/alert Orientation Level: Oriented X4;Oriented to person;Oriented to place;Oriented to time;Oriented to situation Memory: Appears intact Awareness: Appears intact Problem Solving: Appears intact Safety/Judgment: Appears intact    Comprehension  Auditory Comprehension Overall Auditory Comprehension: Appears within functional limits for tasks assessed Reading Comprehension Reading Status: Within funtional limits    Expression Expression Primary Mode of Expression: Verbal Verbal Expression Overall Verbal Expression: Appears within functional limits for tasks assessed Initiation: No impairment Naming: No impairment Written Expression Dominant Hand: Right Written Expression: Within Functional Limits   Oral / Motor Oral Motor/Sensory Function Overall Oral Motor/Sensory Function: Appears within functional limits for tasks assessed Motor Speech Overall Motor Speech: Appears within functional limits for tasks assessed   GO     Quinn Axe T 10/06/2013, 3:44 PM

## 2013-10-06 NOTE — Telephone Encounter (Signed)
[place] with [facility] called to find out what we just recently prescribed the patient.  I reviewed chart and we have not seen pt since July 2014.  No recent rx by [place].

## 2013-10-06 NOTE — Progress Notes (Signed)
Echocardiogram 2D Echocardiogram has been performed.  Tammy Lloyd 10/06/2013, 10:21 AM

## 2013-10-07 DIAGNOSIS — I4891 Unspecified atrial fibrillation: Secondary | ICD-10-CM | POA: Diagnosis not present

## 2013-10-07 DIAGNOSIS — R209 Unspecified disturbances of skin sensation: Secondary | ICD-10-CM | POA: Diagnosis not present

## 2013-10-07 DIAGNOSIS — I6350 Cerebral infarction due to unspecified occlusion or stenosis of unspecified cerebral artery: Secondary | ICD-10-CM | POA: Diagnosis not present

## 2013-10-07 DIAGNOSIS — D320 Benign neoplasm of cerebral meninges: Secondary | ICD-10-CM | POA: Diagnosis not present

## 2013-10-07 MED ORDER — ASPIRIN 325 MG PO TABS: 325.0000 mg | ORAL_TABLET | Freq: Every day | ORAL | Status: DC

## 2013-10-07 MED ORDER — ATORVASTATIN CALCIUM 10 MG PO TABS: 10.0000 mg | ORAL_TABLET | Freq: Every day | ORAL | Status: DC

## 2013-10-07 NOTE — Progress Notes (Signed)
INITIAL NUTRITION ASSESSMENT  DOCUMENTATION CODES Per approved criteria  -Obesity Unspecified   INTERVENTION: Encourage PO intake RD to monitor and provide further intervention as needed  NUTRITION DIAGNOSIS Unintentional weight loss related to decreased appetite as evidenced by 8% weight loss in less than 4 months.   Goal: Pt to meet >/= 90% of their estimated nutrition needs   Monitor:  PO intake, weight trend, labs  Reason for Assessment: MST  80 y.o. female  Admitting Dx: Numbness of left hand  ASSESSMENT: 80 y.o. female with history of breast cancer, hypertension, hemochromatosis was brought to the ER after patient's daughter found patient confused and was complaining of left hand numbness. Patient has been having left hand numbness since last night. Denies any focal deficits headache visual symptoms falls syncope difficulty speaking or swallowing. Patient states that since she came to the ER she has felt slightly dizzy. CT head shows a benign meningioma.  Weight history shows 8% weight loss in less than 4 months; wt loss not significant for time frame. Per nursing notes pt has been eating 50-100% of meals. Pt states that she used to eat 3 meals daily but, due to decreased appetite she has only been eating 2 meals daily (plus snacks) for the past few months causing her to lose weight. Pt feels she has been eating well.    Nutrition Focused Physical Exam:  Subcutaneous Fat:  Orbital Region:wnl Upper Arm Region: mild wasting Thoracic and Lumbar Region: NA  Muscle:  Temple Region: mild wasting Clavicle Bone Region: mild wasting Clavicle and Acromion Bone Region: moderate wasting Scapular Bone Region: NA Dorsal Hand: mild wasting Patellar Region: wnl Anterior Thigh Region: wnl Posterior Calf Region: wnl  Edema: none noted  Height: Ht Readings from Last 1 Encounters:  10/05/13 5\' 5"  (1.651 m)    Weight: Wt Readings from Last 1 Encounters:  10/05/13 183 lb 12.8  oz (83.371 kg)    Ideal Body Weight: 125 lbs  % Ideal Body Weight: 146%  Wt Readings from Last 10 Encounters:  10/05/13 183 lb 12.8 oz (83.371 kg)  06/10/13 198 lb 3 oz (89.897 kg)  04/17/13 199 lb (90.266 kg)  02/03/13 200 lb (90.719 kg)  01/02/13 201 lb 9.6 oz (91.445 kg)  09/30/12 204 lb 3.2 oz (92.625 kg)  07/02/12 206 lb 12.8 oz (93.804 kg)  05/07/12 204 lb (92.534 kg)  03/18/12 208 lb 4.8 oz (94.484 kg)  01/10/12 208 lb (94.348 kg)    Usual Body Weight: 200 lbs  % Usual Body Weight: 92%  BMI:  Body mass index is 30.59 kg/(m^2). (Obesity)  Estimated Nutritional Needs: Kcal: 1500-1700 Protein: 80-90 grams Fluid: 1.5-1.7 L/day  Skin: intact  Diet Order: Cardiac  EDUCATION NEEDS: -No education needs identified at this time   Intake/Output Summary (Last 24 hours) at 10/07/13 1032 Last data filed at 10/07/13 0900  Gross per 24 hour  Intake    720 ml  Output      0 ml  Net    720 ml    Last BM: 3/29  Labs:   Recent Labs Lab 10/05/13 1747  NA 138  K 4.4  CL 98  CO2 22  BUN 27*  CREATININE 1.14*  CALCIUM 9.5  GLUCOSE 113*    CBG (last 3)  No results found for this basename: GLUCAP,  in the last 72 hours  Scheduled Meds: . amLODipine  10 mg Oral Daily  . aspirin  300 mg Rectal Daily   Or  .  aspirin  325 mg Oral Daily  . atorvastatin  10 mg Oral q1800  . enoxaparin (LOVENOX) injection  40 mg Subcutaneous Q24H  . losartan  100 mg Oral Daily    Continuous Infusions:   Past Medical History  Diagnosis Date  . Arthritis   . Hemorrhoid   . Cataract   . Cancer     Breast  . Hypertension   . Hemochromatosis     Followed by  hematology;  . Colon polyp   . Idiopathic hemochromatosis 09/28/2011  . Pulmonary hypertension   . Edema of both legs   . Lichen sclerosus   . Osteoporosis 2005    Past Surgical History  Procedure Laterality Date  . Colonoscopy  2009  . Total knee arthroplasty  2006  . Dilation and curettage of uterus  2005   . Cataract extraction, bilateral    . Breast lumpectomy      Right  . Back surgery  2008  . Hysteroscopy  08/2003    Pryor Ochoa RD, LDN Inpatient Clinical Dietitian Pager: [phone] After Hours Pager: [phone]

## 2013-10-07 NOTE — Discharge Summary (Cosign Needed)
Physician Discharge Summary  Tammy Lloyd YTK:[id number] DOB: 03/30/1924 DOA: 10/05/2013  PCP: [name], MD  Admit date: 10/05/2013 Discharge date: 10/07/2013   Recommendations for Outpatient Follow-up:  1. Monitor lipids and LFTs on statin  Discharge Diagnoses:  Principal Problem:   Numbness of left hand Active Problems:   Hypertension   Meningioma   Atrial fibrillation   Abnormality of gait   Discharge Condition: stable  Filed Weights   10/05/13 1704 10/05/13 2045  Weight: 84.823 kg (187 lb) 83.371 kg (183 lb 12.8 oz)    History of present illness:  80 y.o. female with history of breast cancer, hypertension, hemochromatosis was brought to the ER after patient's daughter found patient confused and was complaining of left hand numbness. Patient has been having left hand numbness since last night. Denies any focal deficits headache visual symptoms falls syncope difficulty speaking or swallowing. Patient states that since she came to the ER she has felt slightly dizzy. CT head shows a benign meningioma. On-call neurologist has been consulted. Patient has been admitted for further management. Patient's EKG shows atrial fibrillation which is new onset. Patient denies any chest pain or shortness of breath.   Hospital Course:  Admitted to hospitalists. MRI showed ischemic changes in bilateral thalami, possibly subacute. Started on aspirin, and lipitor. EKG and telemetry showed rate controlled atrial fibrillation.  Worked with PT, OT.  After discussion with PT, pt NOT started on anticoagulation due to significant fall risk.  Carotid dopplers and echo results below.    Procedures:  none  Consultations:  neurology  Discharge Exam: Filed Vitals:   10/07/13 0952  BP: 134/50  Pulse: 69  Temp:   Resp:     General: alert, oriented Cardiovascular: RRR Respiratory: CTA Neuro: nonfocal  Discharge Instructions  Discharge Orders   Future Appointments Provider  Department Dept Phone   12/11/2013 9:30 AM [facility] [phone]   12/11/2013 10:00 AM Deatra Robinson, MD [facility] [phone]   Future Orders Complete By Expires   Diet - low sodium heart healthy  As directed    Walker   As directed        Medication List         acetaminophen 325 MG tablet  Commonly known as:  TYLENOL  Take 650 mg by mouth every 6 (six) hours as needed. For pain     amLODipine 10 MG tablet  Commonly known as:  NORVASC  Take 1 tablet by mouth   daily     aspirin 325 MG tablet  Take 1 tablet (325 mg total) by mouth daily.     atorvastatin 10 MG tablet  Commonly known as:  LIPITOR  Take 1 tablet (10 mg total) by mouth daily at 6 PM.     losartan-hydrochlorothiazide 100-25 MG per tablet  Commonly known as:  HYZAAR  Take 1 tablet by mouth  daily     Prednicarbate 0.1 % Crea  Apply 1 application topically 2 (two) times daily as needed. For itching     Vitamin D 1000 UNITS capsule  Take 1,000 Units by mouth daily.       No Known Allergies     Follow-up Information   Follow up with [name], MD In 2 weeks.   Specialty:  Family Medicine   Contact information:   [address] [place] [address] [phone]       Please follow up. (ophthalmologist prior to  resuming driving)        The results of significant diagnostics from this hospitalization (including imaging, microbiology, ancillary and laboratory) are listed below for reference.    Significant Diagnostic Studies: Dg Chest 2 View  10/06/2013   CLINICAL DATA:  Stroke, left arm numbness  EXAM: CHEST  2 VIEW  COMPARISON:  10/23/2011  FINDINGS: Cardiomediastinal silhouette is stable. No acute infiltrate or pulmonary edema. Stable atelectasis or scarring in right middle lobe. Surgical clips in right axilla.  IMPRESSION: No active cardiopulmonary disease.   Electronically Signed   By: Lahoma Crocker  M.D.   On: 10/06/2013 07:58   Ct Head Wo Contrast  10/05/2013   CLINICAL DATA:  Left hand weakness, hypertension  EXAM: CT HEAD WITHOUT CONTRAST  TECHNIQUE: Contiguous axial images were obtained from the base of the skull through the vertex without intravenous contrast.  COMPARISON:  None.  FINDINGS: Negative for acute intracranial hemorrhage, acute infarction, hydrocephalus or midline shift. Gray-white differentiation is preserved throughout. A partially calcified, extra-axial dural-based mass is identified along the lateral aspect of the left posterior cranial fossa. The mass measures 1.8 x 2.6 cm in greatest dimension. There is mild local mass of fact on the cerebellum but no evidence of underlying edema. Mild global cerebral volume loss is commensurate with age. Mild periventricular white matter hypoattenuation is most consistent with the sequelae of longstanding microvascular ischemia. No focal soft tissue or calvarial abnormality. The globes and orbits are intact and unremarkable bilaterally. Normal aeration of the mastoid air cells atherosclerotic calcifications noted in the bilateral cavernous carotid arteries.  IMPRESSION: 1. No acute intracranial abnormality. 2. A 2.6 x 1.8 cm extra-axial dural-based and partially calcified mass in the lateral aspect of the left posterior cranial fossa almost certainly represents a benign meningioma. There is mild local mass effect on the adjacent left cerebellar hemisphere but no evidence of associated edema or other acute abnormality. 3. Mild global cerebral atrophy and chronic microvascular ischemic white matter disease. 4. Intracranial atherosclerosis.   Electronically Signed   By: Jacqulynn Cadet M.D.   On: 10/05/2013 18:25   Mr Jodene Nam Head/brain Wo Cm  10/06/2013   CLINICAL DATA:  Left hand numbness and possible stroke. Posterior fossa mass on CT.  EXAM: MRI HEAD WITHOUT AND WITH CONTRAST  MRA HEAD WITHOUT CONTRAST  TECHNIQUE: Multiplanar, multiecho pulse  sequences of the brain and surrounding structures were obtained without and with intravenous contrast. Angiographic images of the head were obtained using MRA technique without contrast.  CONTRAST:  28mL MULTIHANCE GADOBENATE DIMEGLUMINE 529 MG/ML IV SOLN  COMPARISON:  CT HEAD W/O CM dated 10/05/2013  FINDINGS: MRI HEAD FINDINGS  Somewhat linear foci of mildly increased diffusion weighted signal are present in the thalami bilaterally without definite restricted diffusion on the ADC map. There is associated mild T2 hyperintensity in these locations. Age-related cerebral atrophy is present. Patchy T2 hyperintensities in the periventricular white matter are nonspecific but compatible with mild chronic small vessel ischemic disease. There is no midline shift or evidence of intracranial hemorrhage. There is no extra-axial fluid collection.  Enhancing mass in the left posterior fossa lateral to the cerebellum measures 2.7 x 2.2 x 2.4 cm. Central portion of the mass which is nonenhancing may be necrotic. The mass interfaces with the underneath side of the tentorium. The mass is also in close proximity to the transverse/sigmoid sinus junction, however no gross invasion is identified. There is no significant edema within the overlying cerebellum. No other enhancing lesions are identified.  Prior bilateral cataract surgery is noted. No significant paranasal sinus mucosal disease is identified. Mastoid air cells are clear.  MRA HEAD FINDINGS  The visualized distal vertebral arteries are patent with the right vertebral artery being dominant. A portion of the proximal intracranial left vertebral artery is not well evaluated, possibly due to motion. PICA origins are patent bilaterally. AICA and SCA origins are patent. Basilar artery is patent without stenosis. There is severe stenosis of the right PCA origin. Distal to this stenosis, the right P1 and proximal P2 segments appear patent, however there is occlusion of more distal  right PCA branches. The left PCA is unremarkable.  Internal carotid arteries are patent from skullbase to carotid termini. There is a 2 x 2 mm left supraclinoid ICA aneurysm. ACAs and MCAs are unremarkable.  IMPRESSION: 1. Ischemic changes in the thalami, possibly subacute. 2. Severe stenosis of the right PCA origin with occlusion of P2 branches. 3. Extra-axial mass in the left posterior fossa, consistent with a meningioma. 4. 2 mm left supraclinoid ICA aneurysm.   Electronically Signed   By: Logan Bores   On: 10/06/2013 08:40   Echo  - Left ventricle: The cavity size was normal. Wall thickness was normal. Systolic function was normal. The estimated ejection fraction was in the range of 60% to 65%. Wall motion was normal; there were no regional wall motion abnormalities. - Left atrium: The atrium was mildly dilated. - Pulmonary arteries: Systolic pressure was mildly increased. PA peak pressure: 41mm Hg (S). Impressions:  - No cardiac source of emboli was indentified.  EKG Atrial fibrillation  Carotid doppler *[facility]* [address], [place] [address] [phone]  ------------------------------------------------------------ Noninvasive Vascular Lab  Carotid Duplex Study  Patient: Brinda, Focht MR #: [id number] Study Date: 10/06/2013 Gender: F Age: 80 Height: Weight: BSA: Pt. Status: Room: 4N09C  ATTENDING Doree Barthel [employer] [place], [place], Arshad SONOGRAPHER Maudry Mayhew, RVT, RDCS, RDMS Reports also to:  ------------------------------------------------------------ History and indications:  Indications  CVA/Stroke. History  Diagnostic evaluation.  ------------------------------------------------------------ Study information:  Study status: Routine. Procedure: A vascular evaluation was performed with the patient in the supine position.  Image quality was adequate. The right CCA, right ECA, right ICA, right vertebral, left CCA, left ECA, left ICA, and left vertebral arteries were examined. Carotid duplex study. Carotid Duplex exam including 2D imaging, color and spectral Doppler were performed on the extracranial carotid and vertebral arteries using standard established protocols. Location: Vascular laboratory. Patient status: Inpatient.  Arterial flow:  +-----------+--------+-------+--------------+--------------+ Location V sys V ed Flow analysis Comment  +-----------+--------+-------+--------------+--------------+ Right CCA -125cm/s 12cm/s ---------------------------- proximal      +-----------+--------+-------+--------------+--------------+ Right CCA -102cm/s 15cm/s ---------------------------- distal      +-----------+--------+-------+--------------+--------------+ Right ECA -135cm/s-10cm/s---------------------------- +-----------+--------+-------+--------------+--------------+ Right ICA --94cm/s -14cm/s--------------Mild smooth  proximal    homogenous      plaque.  +-----------+--------+-------+--------------+--------------+ Right ICA --90cm/s -20cm/s---------------------------- distal      +-----------+--------+-------+--------------+--------------+ Right 53cm/s 10cm/s Antegrade flow-------------- vertebral      +-----------+--------+-------+--------------+--------------+ Left CCA - 101cm/s 12cm/s ---------------------------- proximal      +-----------+--------+-------+--------------+--------------+ Left CCA - 88cm/s 11cm/s --------------Mild intimal  distal    hyperplasia.  +-----------+--------+-------+--------------+--------------+ Left ECA -169cm/s-12cm/s---------------------------- +-----------+--------+-------+--------------+--------------+ Left ICA - -101cm/s-18cm/s--------------Mild  smooth  proximal    homogenous      plaque.  +-----------+--------+-------+--------------+--------------+ Left ICA - -103cm/s-24cm/s---------------------------- distal      +-----------+--------+-------+--------------+--------------+ Left 57cm/s 6cm/s Antegrade flow-------------- vertebral      +-----------+--------+-------+--------------+--------------+ Right -----------------------------0.92  ICA/CCA      ratio      +-----------+--------+-------+--------------+--------------+ Left -----------------------------1.17  ICA/CCA      ratio      +-----------+--------+-------+--------------+--------------+  ------------------------------------------------------------ Summary: Findings suggest 1-39% internal carotid artery stenosis bilaterally. Vertebral arteries are patent with antegrade flow.    Microbiology: No results found for this or any previous visit (from the past 240 hour(s)).   Labs: Basic Metabolic Panel:  Recent Labs Lab 10/05/13 1747  NA 138  K 4.4  CL 98  CO2 22  GLUCOSE 113*  BUN 27*  CREATININE 1.14*  CALCIUM 9.5   Liver Function Tests:  Recent Labs Lab 10/05/13 1747  AST 16  ALT 8  ALKPHOS 92  BILITOT 0.3  PROT 7.6  ALBUMIN 3.9   No results found for this basename: LIPASE, AMYLASE,  in the last 168 hours No results found for this basename: AMMONIA,  in the last 168 hours CBC:  Recent Labs Lab 10/05/13 1747  WBC 9.9  NEUTROABS 5.9  HGB 13.4  HCT 38.1  MCV 92.0  PLT 310   Cardiac Enzymes:  Recent Labs Lab 10/05/13 1747 10/06/13 0007  TROPONINI <0.30 <0.30   BNP: BNP (last 3 results) No results found for this basename: PROBNP,  in the last 8760 hours CBG: No results found for this basename: GLUCAP,  in the last 168 hours     Signed:  Loda L  [facility] 10/07/2013, 1:58 PM

## 2013-10-07 NOTE — Progress Notes (Signed)
Occupational Therapy Treatment Patient Details Name: Tammy Lloyd MRN: [id number] DOB: 28-Feb-1924 Today's Date: 10/07/2013    History of present illness 80 y.o. s/p subacute bilateral thalamic infarcts   OT comments  Agreeable to oob. Dtr. Present and was able to contribute to the session that pt. Remains very independent and plans on driving upon d/c.  Discussed at length therapist rec. For full visual assessment by opthalmologist  Prior to driving. Both pt. And dtr. Agreed and pts. Dtr. States she will help coordinate scheduling apt.   Follow Up Recommendations  Home health OT;Supervision/Assistance - 24 hour;Other (comment) (full visual assessment from opthamologist)                 Precautions / Restrictions Precautions Precautions: Fall       Mobility Bed Mobility Overal bed mobility: Modified Independent                Transfers Overall transfer level: Needs assistance Equipment used: Rolling walker (2 wheeled) Transfers: Sit to/from Stand Sit to Stand: Supervision                                                 ADL             Toilet Transfer: Supervision/safety       General ADL Comments: sim. toilet transfer using eob to recliner as pt. declined b.room.  dtr. present reviewed, educated and discussed at length concerns regarding L superior visual field cut with regards to driving secondary to needed for looking up at stop lights ect.  note  evaluating therapist rec. is for full visual exam/assessment by an opthamologist.  dtr. verbalized understanding and agreed with the plan.                                       Cognition   Behavior During Therapy: WFL for tasks assessed/performed Overall Cognitive Status: Within Functional Limits for tasks assessed                                                   Pertinent Vitals/ Pain       Denies any pain                                                          Frequency Min 2X/week     Progress Toward Goals  OT Goals(current goals can now be found in the care plan section)  Progress towards OT goals: Progressing toward goals     Plan Discharge plan remains appropriate    End of Session Equipment Utilized During Treatment: Rolling walker  Activity Tolerance Patient tolerated treatment well   Patient Left in chair;with call bell/phone within reach   Nurse Communication          Time: 2951-8841 OT Time Calculation (min): 30 min  Charges: OT General Charges $OT Visit: 1 Procedure OT Treatments $Self Care/Home Management : 23-37 mins  Janice Coffin, COTA/L 10/07/2013, 12:59 PM

## 2013-10-07 NOTE — Progress Notes (Signed)
Physical Therapy Treatment Patient Details Name: Tammy Lloyd MRN: [id number] DOB: 05-19-1924 Today's Date: 10/07/2013    History of Present Illness 80 y.o. s/p subacute bilateral thalamic infarcts    PT Comments    Patient was eager to participate in gait training. Pt. Bed mobility has improved. Pt.  Able to perform stairs and LE AROM post gait training. Continue gait training to strengthen LE.   Follow Up Recommendations  Home health PT;Supervision - Intermittent     Equipment Recommendations  Rolling walker with 5" wheels    Recommendations for Other Services       Precautions / Restrictions Precautions Precautions: Fall    Mobility  Bed Mobility Overal bed mobility: Modified Independent             General bed mobility comments: Patient able to go supine to sitting without A  Transfers Overall transfer level: Needs assistance Equipment used: Rolling walker (2 wheeled) Transfers: Sit to/from Stand Sit to Stand: Supervision         General transfer comment: Patient required cues for proper technique  Ambulation/Gait Ambulation/Gait assistance: Min guard Ambulation Distance (Feet): 200 Feet Assistive device: Rolling walker (2 wheeled) Gait Pattern/deviations: Decreased stride length;Step-through pattern;Trunk flexed         Stairs Stairs: Yes Stairs assistance: Min guard Stair Management: Two rails Number of Stairs: 4 General stair comments: Pt. required VC for proper technique  Wheelchair Mobility    Modified Rankin (Stroke Patients Only) Modified Rankin (Stroke Patients Only) Pre-Morbid Rankin Score: Slight disability Modified Rankin: Moderately severe disability     Balance Overall balance assessment: Needs assistance           Standing balance-Leahy Scale: Fair                      Cognition Arousal/Alertness: Awake/alert Behavior During Therapy: WFL for tasks assessed/performed Overall Cognitive Status: Within  Functional Limits for tasks assessed                      Exercises Total Joint Exercises Long Arc Quad: AROM;Seated;Both;15 reps Marching in Standing: Seated;AROM;Both;15 reps    General Comments        Pertinent Vitals/Pain Denies pain    Home Living                      Prior Function            PT Goals (current goals can now be found in the care plan section) Progress towards PT goals: Progressing toward goals    Frequency  Min 4X/week    PT Plan Current plan remains appropriate    End of Session Equipment Utilized During Treatment: Gait belt Activity Tolerance: Patient tolerated treatment well Patient left: in chair;with call bell/phone within reach     Time: 1330-1353 PT Time Calculation (min): 23 min  Charges:                       G Codes:      Hagop Mccollam, SPTA 10/07/2013, 2:04 PM

## 2013-10-07 NOTE — Progress Notes (Signed)
Seen and agreed 10/07/2013 Robinette, Julia Elizabeth PTA [phone] pager [phone] office    

## 2013-10-09 DIAGNOSIS — H5340 Unspecified visual field defects: Secondary | ICD-10-CM | POA: Diagnosis not present

## 2013-10-09 DIAGNOSIS — H53469 Homonymous bilateral field defects, unspecified side: Secondary | ICD-10-CM | POA: Diagnosis not present

## 2013-10-09 DIAGNOSIS — Z961 Presence of intraocular lens: Secondary | ICD-10-CM | POA: Diagnosis not present

## 2013-10-09 DIAGNOSIS — H02839 Dermatochalasis of unspecified eye, unspecified eyelid: Secondary | ICD-10-CM | POA: Diagnosis not present

## 2013-10-09 LAB — HM DIABETES EYE EXAM

## 2013-10-10 NOTE — Progress Notes (Signed)
Physical Therapy Note  Late entry for missed g-code.     October 28, 2013 0905  PT Visit Information  Last PT Received On 28-Oct-2013  PT G-Codes **NOT FOR INPATIENT CLASS**  Functional Assessment Tool Used Clinical Judgement  Functional Limitation Mobility: Walking and moving around  Mobility: Walking and Moving Around Current Status [phone]) CI  Mobility: Walking and Moving Around Goal Status [phone]) Craig, [place]

## 2013-10-11 DIAGNOSIS — I10 Essential (primary) hypertension: Secondary | ICD-10-CM | POA: Diagnosis not present

## 2013-10-11 DIAGNOSIS — I4891 Unspecified atrial fibrillation: Secondary | ICD-10-CM | POA: Diagnosis not present

## 2013-10-11 DIAGNOSIS — IMO0001 Reserved for inherently not codable concepts without codable children: Secondary | ICD-10-CM | POA: Diagnosis not present

## 2013-10-11 DIAGNOSIS — I69939 Monoplegia of upper limb following unspecified cerebrovascular disease affecting unspecified side: Secondary | ICD-10-CM | POA: Diagnosis not present

## 2013-10-13 ENCOUNTER — Encounter: Payer: Self-pay | Admitting: Internal Medicine

## 2013-10-16 ENCOUNTER — Encounter: Payer: Self-pay | Admitting: Family Medicine

## 2013-10-16 ENCOUNTER — Ambulatory Visit (INDEPENDENT_AMBULATORY_CARE_PROVIDER_SITE_OTHER): Payer: Medicare Other | Admitting: Family Medicine

## 2013-10-16 VITALS — BP 112/60 | HR 80 | Wt 187.0 lb

## 2013-10-16 DIAGNOSIS — D329 Benign neoplasm of meninges, unspecified: Secondary | ICD-10-CM

## 2013-10-16 DIAGNOSIS — I200 Unstable angina: Secondary | ICD-10-CM | POA: Diagnosis not present

## 2013-10-16 DIAGNOSIS — I4891 Unspecified atrial fibrillation: Secondary | ICD-10-CM | POA: Diagnosis not present

## 2013-10-16 DIAGNOSIS — E785 Hyperlipidemia, unspecified: Secondary | ICD-10-CM | POA: Diagnosis not present

## 2013-10-16 DIAGNOSIS — I10 Essential (primary) hypertension: Secondary | ICD-10-CM | POA: Diagnosis not present

## 2013-10-16 DIAGNOSIS — D320 Benign neoplasm of cerebral meninges: Secondary | ICD-10-CM

## 2013-10-16 MED ORDER — RIVAROXABAN 20 MG PO TABS: 20.0000 mg | ORAL_TABLET | Freq: Every day | ORAL | Status: DC

## 2013-10-16 NOTE — Patient Instructions (Signed)
Stop  The aspirin

## 2013-10-16 NOTE — Progress Notes (Signed)
   Subjective:    Patient ID: Tammy Lloyd, female    DOB: Nov 02, 1923, 80 y.o.   MRN: [id number]  HPI She is here for a followup visit she was recently admitted to the to the hospital for evaluation of left hand numbness. The work up did show that she had atrial fib as well as a meningioma that was benign. They also place her on Lipitor. She also is based on adult dosing of aspirin. At this time she has no weakness, numbness, dizziness or other symptoms.   Review of Systems     Objective:   Physical Exam Alert and in no distress. Cardiac exam does show an irregular rhythm. Lungs are clear to auscultation. Normal motor, sensory and DTRs of her upper remedies.       Assessment & Plan:  Atrial fibrillation - Plan: Rivaroxaban (XARELTO) 20 MG TABS tablet  Meningioma  Hypertension  Hyperlipidemia LDL goal < 100  I will start her on XARELTO and have her stop her aspirin. She will continue on her other medications. Followup here in about a month.

## 2013-10-21 ENCOUNTER — Telehealth: Payer: Self-pay | Admitting: Family Medicine

## 2013-10-21 NOTE — Telephone Encounter (Signed)
She has concerns over the medication causing bleeding. I explained the risk and benefit. Explained that she notes easy bruisability or black tarry stools, let me know.

## 2013-10-21 NOTE — Telephone Encounter (Signed)
Pt called and states she has seen 2 tv commercials yesterday talking about Xarelto that it could cause internal bleeding and she could die.  She said she doesn't want to pay for something that could kill her.  Please call pt [phone]

## 2013-10-28 ENCOUNTER — Telehealth: Payer: Self-pay | Admitting: Family Medicine

## 2013-10-30 NOTE — Telephone Encounter (Signed)
P.A. Approved for Xarelto, pt informed

## 2013-11-05 NOTE — Progress Notes (Signed)
10/06/13 1500  SLP Visit Information  SLP Received On 10/06/13  SLP Time Calculation  SLP Start Time 1515  General Information  HPI Tammy Lloyd is a 80 y.o. female with history of breast cancer, hypertension, hemochromatosis was brought to the ER after patient's daughter found patient confused and was complaining of left hand numbness. CT revealed a benign meningioma; MRI: Ischemic changes in the thalami, possibly subacute;  2 mm left supraclinoid ICA aneurysm.Marland Kitchen  SLP was consulted for Speech -Language evaluation as part of the Stroke team.  Prior Functional Status  Cognitive/Linguistic Baseline WFL  Type of Home House  Lives With Alone  Available Help at Discharge Family  Vocation Retired  Cognition  Overall Cognitive Status Within Functional Limits for tasks assessed  Arousal/Alertness Awake/alert  Orientation Level Oriented X4;Oriented to person;Oriented to place;Oriented to time;Oriented to situation  Memory Appears intact  Awareness Appears intact  Problem Solving Appears intact  Safety/Judgment Appears intact  Auditory Comprehension  Overall Auditory Comprehension Appears within functional limits for tasks assessed  Reading Comprehension  Reading Status WFL  Expression  Primary Mode of Expression Verbal  Verbal Expression  Overall Verbal Expression Appears within functional limits for tasks assessed  Initiation No impairment  Naming No impairment  Written Expression  Dominant Hand Right  Written Expression WFL  Oral Motor/Sensory Function  Overall Oral Motor/Sensory Function Appears within functional limits for tasks assessed  Motor Speech  Overall Motor Speech Appears within functional limits for tasks assessed  SLP - End of Session  Patient left in bed;with call bell/phone within reach;with family/visitor present  Assessment  Clinical Impression Statement Patient appears to have normal speech, language, and cognitive function for her age.  Pt. and family agree.    SLP Recommendation/Assessment Patient does not need any further Speech Lanaguage Pathology Services  No Skilled Speech Therapy Patient at baseline level of functioning  Individuals Consulted  Consulted and Agree with Results and Recommendations Patient;Family member/caregiver  Family Member Consulted  daughter  SLP Goals  Progress/Goals/Alternative treatment plan discussed with pt/caregiver and they Agree  SLP G-Codes **NOT FOR INPATIENT CLASS**  Functional Assessment Tool Used skilled clinical judgement via chart review  Functional Limitations Spoken language expressive  Spoken Language Expression Current Status [phone]) [place]  Spoken Language Expression Goal Status [phone]) [place]  Spoken Language Expression Discharge Status ([id number]) [place]  SLP Evaluations  $ SLP Speech Visit 1 Procedure  SLP Evaluations  $ SLP EVAL LANGUAGE/SOUND PRODUCTION 1 Procedure

## 2013-11-06 ENCOUNTER — Other Ambulatory Visit: Payer: Self-pay | Admitting: Family Medicine

## 2013-11-11 DIAGNOSIS — I69939 Monoplegia of upper limb following unspecified cerebrovascular disease affecting unspecified side: Secondary | ICD-10-CM

## 2013-11-25 ENCOUNTER — Telehealth: Payer: Self-pay | Admitting: Physician Assistant

## 2013-11-25 NOTE — Telephone Encounter (Signed)
, °

## 2013-12-09 DIAGNOSIS — H26499 Other secondary cataract, unspecified eye: Secondary | ICD-10-CM | POA: Diagnosis not present

## 2013-12-09 DIAGNOSIS — H02839 Dermatochalasis of unspecified eye, unspecified eyelid: Secondary | ICD-10-CM | POA: Diagnosis not present

## 2013-12-09 DIAGNOSIS — H53469 Homonymous bilateral field defects, unspecified side: Secondary | ICD-10-CM | POA: Diagnosis not present

## 2013-12-09 DIAGNOSIS — Z961 Presence of intraocular lens: Secondary | ICD-10-CM | POA: Diagnosis not present

## 2013-12-11 ENCOUNTER — Other Ambulatory Visit: Payer: Medicare Other

## 2013-12-11 ENCOUNTER — Ambulatory Visit: Payer: Medicare Other | Admitting: Oncology

## 2013-12-18 ENCOUNTER — Telehealth: Payer: Self-pay | Admitting: Oncology

## 2013-12-18 ENCOUNTER — Encounter: Payer: Self-pay | Admitting: Physician Assistant

## 2013-12-18 ENCOUNTER — Ambulatory Visit (HOSPITAL_BASED_OUTPATIENT_CLINIC_OR_DEPARTMENT_OTHER): Payer: Medicare Other | Admitting: Physician Assistant

## 2013-12-18 ENCOUNTER — Other Ambulatory Visit (HOSPITAL_BASED_OUTPATIENT_CLINIC_OR_DEPARTMENT_OTHER): Payer: Medicare Other

## 2013-12-18 DIAGNOSIS — D320 Benign neoplasm of cerebral meninges: Secondary | ICD-10-CM | POA: Diagnosis not present

## 2013-12-18 DIAGNOSIS — G459 Transient cerebral ischemic attack, unspecified: Secondary | ICD-10-CM | POA: Diagnosis not present

## 2013-12-18 DIAGNOSIS — I4891 Unspecified atrial fibrillation: Secondary | ICD-10-CM

## 2013-12-18 DIAGNOSIS — Z853 Personal history of malignant neoplasm of breast: Secondary | ICD-10-CM

## 2013-12-18 DIAGNOSIS — I6350 Cerebral infarction due to unspecified occlusion or stenosis of unspecified cerebral artery: Secondary | ICD-10-CM | POA: Diagnosis not present

## 2013-12-18 DIAGNOSIS — R209 Unspecified disturbances of skin sensation: Secondary | ICD-10-CM | POA: Diagnosis not present

## 2013-12-18 DIAGNOSIS — I517 Cardiomegaly: Secondary | ICD-10-CM | POA: Diagnosis not present

## 2013-12-18 DIAGNOSIS — I10 Essential (primary) hypertension: Secondary | ICD-10-CM | POA: Diagnosis not present

## 2013-12-18 DIAGNOSIS — R269 Unspecified abnormalities of gait and mobility: Secondary | ICD-10-CM | POA: Diagnosis not present

## 2013-12-18 LAB — CBC WITH DIFFERENTIAL/PLATELET
BASO%: 0.8 % (ref 0.0–2.0)
Basophils Absolute: 0.1 10*3/uL (ref 0.0–0.1)
EOS ABS: 0.4 10*3/uL (ref 0.0–0.5)
EOS%: 4.1 % (ref 0.0–7.0)
HCT: 36.4 % (ref 34.8–46.6)
HGB: 12.1 g/dL (ref 11.6–15.9)
LYMPH%: 33.6 % (ref 14.0–49.7)
MCH: 31.7 pg (ref 25.1–34.0)
MCHC: 33.2 g/dL (ref 31.5–36.0)
MCV: 95.3 fL (ref 79.5–101.0)
MONO#: 0.9 10*3/uL (ref 0.1–0.9)
MONO%: 9.4 % (ref 0.0–14.0)
NEUT#: 5 10*3/uL (ref 1.5–6.5)
NEUT%: 52.1 % (ref 38.4–76.8)
Platelets: 272 10*3/uL (ref 145–400)
RBC: 3.82 10*6/uL (ref 3.70–5.45)
RDW: 13.2 % (ref 11.2–14.5)
WBC: 9.7 10*3/uL (ref 3.9–10.3)
lymph#: 3.3 10*3/uL (ref 0.9–3.3)

## 2013-12-18 LAB — FERRITIN CHCC: Ferritin: 278 ng/ml — ABNORMAL HIGH (ref 9–269)

## 2013-12-18 LAB — IRON AND TIBC CHCC
%SAT: UNDETERMINED % (ref 21–?)
Iron: 165 ug/dL — ABNORMAL HIGH (ref 41–142)
TIBC: 158 ug/dL — ABNORMAL LOW (ref 236–444)
UIBC: UNDETERMINED ug/dL (ref 120–384)

## 2013-12-18 NOTE — Telephone Encounter (Signed)
gv and printed appt sched and avs for pt fro Dec °

## 2013-12-18 NOTE — Progress Notes (Signed)
OFFICE PROGRESS NOTE  CC [name], MD  [name], MD [place] [address]  DIAGNOSIS: 80 year old with  1. Right breast cancer diagnosed November 2000  2. hemochromastosis diagnosed 2008, phlebotomies to keep ferritin below 500  3. Osteoarthritis and chronic low back pain  4. Hypertension  5. Osteoporosis/osteopenia  CURRENT THERAPY:phlebotomies as needed  INTERVAL HISTORY: Tammy Lloyd 80 y.o. female returns for follow up visit.  She denies headache, vision changes, swelling, chest pain, shortness of breath. As her last visit to our office on 06/10/2013, she reports that she had a "mini stroke". Her left hand is still a little bit numb. She has no other sequelae from the stroke. She was also recently diagnosed with atrial fibrillation and is on Xarelto. She has a remote history of breast cancer dating back to 2000. Ms. Pesci tells me that she has not required phlebotomy in approximately 2 years.  A 10 point ROS is neg.   MEDICAL HISTORY: Past Medical History  Diagnosis Date  . Arthritis   . Hemorrhoid   . Cataract   . Cancer     Breast  . Hypertension   . Hemochromatosis     Followed by  hematology;  . Colon polyp   . Idiopathic hemochromatosis 09/28/2011  . Pulmonary hypertension   . Edema of both legs   . Lichen sclerosus   . Osteoporosis 2005    ALLERGIES:  has No Known Allergies.  MEDICATIONS:  Current Outpatient Prescriptions  Medication Sig Dispense Refill  . acetaminophen (TYLENOL) 325 MG tablet Take 650 mg by mouth every 6 (six) hours as needed. For pain      . amLODipine (NORVASC) 10 MG tablet Take 1 tablet by mouth  daily  90 tablet  0  . Cholecalciferol (VITAMIN D) 1000 UNITS capsule Take 1,000 Units by mouth daily.       Marland Kitchen losartan-hydrochlorothiazide (HYZAAR) 100-25 MG per tablet Take 1 tablet by mouth  daily  90 tablet  0  . Prednicarbate 0.1 % CREA Apply 1 application topically 2 (two) times daily as  needed. For itching  60 g  1  . Rivaroxaban (XARELTO) 20 MG TABS tablet Take 1 tablet (20 mg total) by mouth daily with supper.  90 tablet  3  . aspirin 325 MG tablet Take 1 tablet (325 mg total) by mouth daily.      Marland Kitchen atorvastatin (LIPITOR) 10 MG tablet Take 1 tablet (10 mg total) by mouth daily at 6 PM.  30 tablet  0   No current facility-administered medications for this visit.    SURGICAL HISTORY:  Past Surgical History  Procedure Laterality Date  . Colonoscopy  2009  . Total knee arthroplasty  2006  . Dilation and curettage of uterus  2005  . Cataract extraction, bilateral    . Breast lumpectomy      Right  . Back surgery  2008  . Hysteroscopy  08/2003    REVIEW OF SYSTEMS:  General: fatigue (-), night sweats (-), fever (-), pain (-) Lymph: palpable nodes (-) HEENT: vision changes (-), mucositis (-), gum bleeding (-), epistaxis (-) Cardiovascular: chest pain (-), palpitations (-) Pulmonary: shortness of breath (-), dyspnea on exertion (-), cough (-), hemoptysis (-) GI:  Early satiety (-), melena (-), dysphagia (-), nausea/vomiting (-), diarrhea (-) GU: dysuria (-), hematuria (-), incontinence (-) Musculoskeletal: joint swelling (-), joint pain (-), back pain (-) Neuro: weakness (-), numbness (+, left hand), headache (-), confusion (-) Skin:  Rash (-), lesions (-), dryness (-) Psych: depression (-), suicidal/homicidal ideation (-), feeling of hopelessness (-)  PHYSICAL EXAMINATION: BP 148/57  Pulse 71  Temp(Src) 98.3 F (36.8 C) (Oral)  Resp 19  Ht 5\' 5"  (1.651 m)  Wt 184 lb 11.2 oz (83.779 kg)  BMI 30.74 kg/m2  SpO2 98%  General: Patient is a well appearing female in no acute distress HEENT: PERRLA, sclerae anicteric no conjunctival pallor, MMM Neck: supple, no palpable adenopathy Lungs: clear to auscultation bilaterally, no wheezes, rhonchi, or rales Cardiovascular: regular rate rhythm, S1, S2, no murmurs, rubs or gallops Abdomen: Soft, non-tender, non-distended,  normoactive bowel sounds, no HSM Extremities: warm and well perfused, no clubbing, cyanosis, or edema Skin: No rashes or lesions Neuro: Grossly normal  ECOG PERFORMANCE STATUS: 1 - Symptomatic but completely ambulatory   LABORATORY DATA: Lab Results  Component Value Date   WBC 9.7 12/18/2013   HGB 12.1 12/18/2013   HCT 36.4 12/18/2013   MCV 95.3 12/18/2013   PLT 272 12/18/2013      Chemistry      Component Value Date/Time   NA 138 10/05/2013 1747   NA 139 06/10/2013 0936   NA 139 04/08/2010 1455   K 4.4 10/05/2013 1747   K 4.7 06/10/2013 0936   K 4.8* 04/08/2010 1455   CL 98 10/05/2013 1747   CL 106 09/30/2012 0913   CL 104 04/08/2010 1455   CO2 22 10/05/2013 1747   CO2 24 06/10/2013 0936   CO2 28 04/08/2010 1455   BUN 27* 10/05/2013 1747   BUN 27.8* 06/10/2013 0936   BUN 32* 04/08/2010 1455   CREATININE 1.14* 10/05/2013 1747   CREATININE 1.2* 06/10/2013 0936   CREATININE 1.06 12/19/2010 1617      Component Value Date/Time   CALCIUM 9.5 10/05/2013 1747   CALCIUM 9.4 06/10/2013 0936   CALCIUM 9.0 04/08/2010 1455   ALKPHOS 92 10/05/2013 1747   ALKPHOS 77 01/02/2013 0918   ALKPHOS 78 04/08/2010 1455   AST 16 10/05/2013 1747   AST 13 01/02/2013 0918   AST 27 04/08/2010 1455   ALT 8 10/05/2013 1747   ALT 7 01/02/2013 0918   ALT 18 04/08/2010 1455   BILITOT 0.3 10/05/2013 1747   BILITOT 0.31 01/02/2013 0918   BILITOT 0.40 04/08/2010 1455       RADIOGRAPHIC STUDIES:  No results found.  ASSESSMENT: 80 year old with 1. Breast cancer  2. Hemochromatosis   PLAN:   Patient reviewed with Dr. Alen Blew.  1.  Doing well, no need for doing phlebotomy today   2.  she'll followup with Dr. Alen Blew in 6 months. At that time we will obtain CBC and iron studies.  She will require a phlebotomy if her ferritin is greater than 500.   3. She'll be scheduled for her screening mammogram in late October or early November of [facility]. Her screening mammogram on 05/08/2013 was negative for any evidence  of malignancy   4. Recent mild CVA and new diagnosis of atrial fibrillation. Patient is followed for these entities by her primary care physician.   All questions were answered. The patient knows to call the clinic with any problems, questions or concerns. We can certainly see the patient much sooner if necessary.   Sampson Si [facility] [place] [phone] (Office)  12/18/2013, 3:55 PM

## 2013-12-20 NOTE — Patient Instructions (Signed)
Be sure to get your mammogram as scheduled either late October or early November of this year Followup in 6 months

## 2013-12-22 ENCOUNTER — Ambulatory Visit: Payer: PRIVATE HEALTH INSURANCE | Admitting: Family Medicine

## 2013-12-22 ENCOUNTER — Ambulatory Visit (INDEPENDENT_AMBULATORY_CARE_PROVIDER_SITE_OTHER): Payer: Medicare Other | Admitting: Family Medicine

## 2013-12-22 ENCOUNTER — Encounter: Payer: Self-pay | Admitting: Family Medicine

## 2013-12-22 VITALS — BP 110/60 | Wt 185.0 lb

## 2013-12-22 DIAGNOSIS — Z79899 Other long term (current) drug therapy: Secondary | ICD-10-CM | POA: Diagnosis not present

## 2013-12-22 DIAGNOSIS — E785 Hyperlipidemia, unspecified: Secondary | ICD-10-CM

## 2013-12-22 DIAGNOSIS — I4891 Unspecified atrial fibrillation: Secondary | ICD-10-CM | POA: Diagnosis not present

## 2013-12-22 MED ORDER — PRAVASTATIN SODIUM 20 MG PO TABS: 20.0000 mg | ORAL_TABLET | Freq: Every day | ORAL | Status: DC

## 2013-12-22 NOTE — Patient Instructions (Signed)
Take watery stools every day but if you have any problems with the give me a call

## 2013-12-22 NOTE — Progress Notes (Signed)
   Subjective:    Patient ID: Tammy Lloyd, female    DOB: 1923/08/08, 80 y.o.   MRN: [id number]  HPI She is here for a recheck. She had unknown difficulties with her Lipitor and stopped medication. She cannot tell me exactly why she stopped but her symptoms did go away after that. She does complain of some residual tingling sensation in her left hand fingertips. She has no other concerns or complaints. Continues on her present medication regimen listed in the chart.   Review of Systems     Objective:   Physical Exam Alert and in no distress. Moves all extremities. Cardiac exam shows a slightly irregular rhythm. Lungs are clear to auscultation.       Assessment & Plan:  Hyperlipidemia LDL goal < 100 - Plan: pravastatin (PRAVACHOL) 20 MG tablet  Atrial fibrillation  Encounter for long-term (current) use of other medications - Plan: pravastatin (PRAVACHOL) 20 MG tablet  I informed her to let he know if she has any difficulty with switching otherwise I will see her in 2 months. Explained the need for XARELTO and Pravachol to reduce her risk of further issues with CVA.

## 2014-02-23 ENCOUNTER — Encounter: Payer: Self-pay | Admitting: Family Medicine

## 2014-02-23 ENCOUNTER — Ambulatory Visit (INDEPENDENT_AMBULATORY_CARE_PROVIDER_SITE_OTHER): Payer: Medicare Other | Admitting: Family Medicine

## 2014-02-23 VITALS — BP 120/60 | HR 50 | Wt 181.0 lb

## 2014-02-23 DIAGNOSIS — I4891 Unspecified atrial fibrillation: Secondary | ICD-10-CM

## 2014-02-23 DIAGNOSIS — Z79899 Other long term (current) drug therapy: Secondary | ICD-10-CM

## 2014-02-23 DIAGNOSIS — I482 Chronic atrial fibrillation, unspecified: Secondary | ICD-10-CM

## 2014-02-23 DIAGNOSIS — I200 Unstable angina: Secondary | ICD-10-CM

## 2014-02-23 DIAGNOSIS — E785 Hyperlipidemia, unspecified: Secondary | ICD-10-CM | POA: Diagnosis not present

## 2014-02-23 NOTE — Progress Notes (Signed)
   Subjective:    Patient ID: Tammy Lloyd, female    DOB: [birth year], 80 y.o.   MRN: [id number]  HPI She is here for recheck. She is now taking Pravachol and XARELTO. She has no complaints of any symptoms.   Review of Systems     Objective:   Physical Exam Alert and in no distress. Cardiac exam does show an irregular rhythm.       Assessment & Plan:  Chronic atrial fibrillation  Hyperlipidemia LDL goal <100 - Plan: Lipid panel  Encounter for long-term (current) use of other medications - Plan: Lipid panel

## 2014-02-24 LAB — LIPID PANEL
Cholesterol: 147 mg/dL (ref 0–200)
HDL: 47 mg/dL (ref >39)
LDL Cholesterol: 80 mg/dL (ref 0–99)
Total CHOL/HDL Ratio: 3.1 Ratio
Triglycerides: 99 mg/dL (ref <150)
VLDL: 20 mg/dL (ref 0–40)

## 2014-03-02 ENCOUNTER — Other Ambulatory Visit: Payer: Self-pay

## 2014-03-02 ENCOUNTER — Telehealth: Payer: Self-pay | Admitting: Family Medicine

## 2014-03-02 DIAGNOSIS — Z79899 Other long term (current) drug therapy: Secondary | ICD-10-CM

## 2014-03-02 MED ORDER — PRAVASTATIN SODIUM 20 MG PO TABS: 20.0000 mg | ORAL_TABLET | Freq: Every day | ORAL | Status: DC

## 2014-03-02 NOTE — Telephone Encounter (Signed)
Pt called for refill of PREVASTATIN  Wants 90 day supply to [facility]

## 2014-03-02 NOTE — Telephone Encounter (Signed)
DONE

## 2014-03-06 ENCOUNTER — Other Ambulatory Visit: Payer: Self-pay | Admitting: Family Medicine

## 2014-04-28 ENCOUNTER — Telehealth: Payer: Self-pay | Admitting: Family Medicine

## 2014-04-28 NOTE — Telephone Encounter (Signed)
Pt called and left message on voice mail regarding meds.  I tried to call pt back and no answer

## 2014-04-28 NOTE — Telephone Encounter (Signed)
Pt is coming in tomorrow to talk to doctor about the [place]

## 2014-04-28 NOTE — Telephone Encounter (Signed)
Let her know that the XARELTO probably did make her nosebleed but she needs that to protect against stroke. She has difficulty, have her come in here to let me evaluate the nosebleeds

## 2014-04-28 NOTE — Telephone Encounter (Signed)
Pt called and states she got up Sunday morning and her nose started bleeding for no reason and it took along time for it to stop.  Monday morning it started bleeding and again Monday night.  Pt wondered if it is that she is taking Xarelto.  So she did not take Xarelto last night and her nose has not bled today.  Please advise pt what to do [phone]

## 2014-04-29 ENCOUNTER — Ambulatory Visit (INDEPENDENT_AMBULATORY_CARE_PROVIDER_SITE_OTHER): Payer: Medicare Other | Admitting: Family Medicine

## 2014-04-29 VITALS — BP 124/84 | HR 74 | Wt 180.0 lb

## 2014-04-29 DIAGNOSIS — I249 Acute ischemic heart disease, unspecified: Secondary | ICD-10-CM

## 2014-04-29 DIAGNOSIS — Z79899 Other long term (current) drug therapy: Secondary | ICD-10-CM | POA: Diagnosis not present

## 2014-04-29 DIAGNOSIS — I482 Chronic atrial fibrillation, unspecified: Secondary | ICD-10-CM

## 2014-04-29 DIAGNOSIS — R040 Epistaxis: Secondary | ICD-10-CM

## 2014-04-29 NOTE — Progress Notes (Signed)
   Subjective:    Patient ID: Tammy Lloyd, female    DOB: 1924/04/03, 80 y.o.   MRN: [id number]  HPI She has recently experienced some difficulty with anterior, left more so than right. She continues on XARELTO. She has not noted any easy bruising, bleeding from her gums, abdominal pain, melena.   Review of Systems     Objective:   Physical Exam Alert and in no distress. Nasal exam does show recent bleed in the anterior aspect of both nares.       Assessment & Plan:  Epistaxis  Chronic atrial fibrillation  Encounter for long-term (current) use of other high-risk medications  Xylocaine ointment was used to anesthetize the nose and the areas of vascularity were cauterized with silver nitrate without difficulty. If she continues to have difficulty with this, ENT evaluation will be needed. I also discussed watching for easy bruisibility, black tarry stools and vomiting blood. She will call if she has any questions. I encouraged her to continue on XARELTO.

## 2014-05-07 ENCOUNTER — Ambulatory Visit (INDEPENDENT_AMBULATORY_CARE_PROVIDER_SITE_OTHER): Payer: Medicare Other | Admitting: Gynecology

## 2014-05-07 ENCOUNTER — Encounter: Payer: Self-pay | Admitting: Gynecology

## 2014-05-07 VITALS — BP 120/74 | Ht 64.0 in | Wt 188.0 lb

## 2014-05-07 DIAGNOSIS — N952 Postmenopausal atrophic vaginitis: Secondary | ICD-10-CM

## 2014-05-07 DIAGNOSIS — I249 Acute ischemic heart disease, unspecified: Secondary | ICD-10-CM | POA: Diagnosis not present

## 2014-05-07 DIAGNOSIS — L900 Lichen sclerosus et atrophicus: Secondary | ICD-10-CM

## 2014-05-07 DIAGNOSIS — R8290 Unspecified abnormal findings in urine: Secondary | ICD-10-CM | POA: Diagnosis not present

## 2014-05-07 DIAGNOSIS — M8580 Other specified disorders of bone density and structure, unspecified site: Secondary | ICD-10-CM | POA: Diagnosis not present

## 2014-05-07 DIAGNOSIS — C50911 Malignant neoplasm of unspecified site of right female breast: Secondary | ICD-10-CM | POA: Diagnosis not present

## 2014-05-07 MED ORDER — CLOBETASOL PROPIONATE 0.05 % EX CREA: 1.0000 "application " | TOPICAL_CREAM | Freq: Every evening | CUTANEOUS | Status: DC

## 2014-05-07 NOTE — Patient Instructions (Signed)
Use the Temovate cream on the outside of the vagina as needed for irritation/itching.  Follow up for the bone density as scheduled.

## 2014-05-07 NOTE — Progress Notes (Signed)
Tammy Lloyd November 03, 1923 [id number]        80 y.o.  G1P1 for follow up exam. Several issues noted below.  Past medical history,surgical history, problem list, medications, allergies, family history and social history were all reviewed and documented as reviewed in the EPIC chart.  ROS:  12 system ROS performed with pertinent positives and negatives included in the history, assessment and plan.   Additional significant findings :  none   Exam: Kim Counsellor Vitals:   05/07/14 1114  BP: 120/74  Height: 5\' 4"  (1.626 m)  Weight: 188 lb (85.276 kg)   General appearance:  Normal affect, orientation and appearance. Skin: Grossly normal HEENT: Without gross lesions.  No cervical or supraclavicular adenopathy. Thyroid normal.  Lungs:  Clear without wheezing, rales or rhonchi Cardiac: RR, without RMG Abdominal:  Soft, nontender, without masses, guarding, rebound, organomegaly or hernia Breasts:  Examined lying and sitting without masses, retractions, discharge or axillary adenopathy.  Well-healed right lumpectomy scar Pelvic:  Ext/BUS/vagina with atrophic changes. Perivaginal whitening of the skin consistent with her history of lichen sclerosis. No specific lesions noted.  Shortened vagina.  Cervix not clearly visualized  Uterus not clearly palpated, without gross masses or tenderness   Adnexa  Without masses or tenderness    Anus and perineum  Normal   Rectovaginal  Normal sphincter tone without palpated masses or tenderness.    Assessment/Plan:  80 y.o. G1P1 female for annual exam.   1. Postmenopausal. Patient without significant symptoms of hot flashes, night sweats. Is not sexually active. No vaginal bleeding. Continue to monitor. 2. Lichen sclerosis. Patient doing well with intermittent Temovate 0.05% cream when symptoms flare. Refill provided. Exam is consistent with her history showing some whitening around the introital opening but no specific lesions. 3. Osteopenia. Patient  originally had osteoporosis diagnosed in 2005 through [facility].  Noting a -3.1 T score on a lateral lumbar spine. She was started on Fosamax and did well.  Was discontinued 2 years ago after approximately 8 years of use.  Follow up DEXA 05/2010 shows osteopenia -1.5 with FRAX major osteoporotic risk 13% and hip fracture 3.3%. Her L3-L4 were discarded due to screws and plates whereas they were included in the 2005 scan. She had been on tamoxifen and subsequent aromatase inhibitor for her breast cancer although his no longer on either of these. Recommend follow up DEXA now she will schedule. Increased calcium vitamin D recommendations reviewed 4. History of right breast cancer. Has mammography scheduled next month. Exam NED. Continue with annual mammography. SBE monthly reviewed. 5. Pap smear 2008. No Pap smear done today. No history of significant abnormal Pap smears. We both agreed to stop screening as she is over the age 50 and she is comfortable with this. 6. Colonoscopy 6 years ago. Repeat at their recommended interval. 7. Health maintenance. No routine blood work done as this is done by her primary physician. Follow up for bone density otherwise annually.     Anastasio Auerbach MD, 11:42 AM 05/07/2014

## 2014-05-08 ENCOUNTER — Telehealth: Payer: Self-pay | Admitting: *Deleted

## 2014-05-08 LAB — URINALYSIS W MICROSCOPIC + REFLEX CULTURE
BILIRUBIN URINE: NEGATIVE
Casts: NONE SEEN
Crystals: NONE SEEN
GLUCOSE, UA: NEGATIVE mg/dL
HGB URINE DIPSTICK: NEGATIVE
Ketones, ur: NEGATIVE mg/dL
Nitrite: NEGATIVE
Protein, ur: NEGATIVE mg/dL
Specific Gravity, Urine: 1.012 (ref 1.005–1.030)
Squamous Epithelial / LPF: NONE SEEN
UROBILINOGEN UA: 0.2 mg/dL (ref 0.0–1.0)
pH: 6 (ref 5.0–8.0)

## 2014-05-08 NOTE — Telephone Encounter (Signed)
Pt called with question about Rx given at 05/07/14 visit. I left message that we called and we would call back Monday KW CMA

## 2014-05-09 LAB — URINE CULTURE
Colony Count: NO GROWTH
ORGANISM ID, BACTERIA: NO GROWTH

## 2014-05-11 ENCOUNTER — Encounter: Payer: Self-pay | Admitting: Gynecology

## 2014-05-11 ENCOUNTER — Telehealth: Payer: Self-pay | Admitting: Oncology

## 2014-05-11 MED ORDER — PREDNICARBATE 0.1 % EX CREA: 1.0000 "application " | TOPICAL_CREAM | Freq: Two times a day (BID) | CUTANEOUS | Status: DC | PRN

## 2014-05-11 NOTE — Telephone Encounter (Signed)
Pt informed with the below, Pt said temovate does not do any good, the prednicabate does better.

## 2014-05-11 NOTE — Telephone Encounter (Signed)
lft msg for pt advising of r/s apt per provider sch.... Mailed updated sch.... KJ °

## 2014-05-11 NOTE — Telephone Encounter (Signed)
Okay to use instead of the Temovate

## 2014-05-11 NOTE — Telephone Encounter (Signed)
okay

## 2014-05-11 NOTE — Telephone Encounter (Signed)
Rx sent 

## 2014-05-11 NOTE — Telephone Encounter (Signed)
Dr.Fontaine, pt asked if she could have Prednicarbate 0.1 % cream rather than the Temovate 0.5 % cream. Rx for temovate was given on 05/07/14 OV. Please advise

## 2014-05-12 DIAGNOSIS — Z1231 Encounter for screening mammogram for malignant neoplasm of breast: Secondary | ICD-10-CM | POA: Diagnosis not present

## 2014-05-12 DIAGNOSIS — Z853 Personal history of malignant neoplasm of breast: Secondary | ICD-10-CM | POA: Diagnosis not present

## 2014-05-12 LAB — HM MAMMOGRAPHY

## 2014-05-13 ENCOUNTER — Encounter: Payer: Self-pay | Admitting: Internal Medicine

## 2014-05-14 ENCOUNTER — Ambulatory Visit (INDEPENDENT_AMBULATORY_CARE_PROVIDER_SITE_OTHER): Payer: Medicare Other

## 2014-05-14 ENCOUNTER — Encounter: Payer: Self-pay | Admitting: Gynecology

## 2014-05-14 DIAGNOSIS — M8580 Other specified disorders of bone density and structure, unspecified site: Secondary | ICD-10-CM

## 2014-06-06 ENCOUNTER — Other Ambulatory Visit: Payer: Self-pay | Admitting: Family Medicine

## 2014-06-18 ENCOUNTER — Encounter (HOSPITAL_COMMUNITY): Payer: Self-pay | Admitting: Cardiology

## 2014-06-19 ENCOUNTER — Ambulatory Visit (HOSPITAL_BASED_OUTPATIENT_CLINIC_OR_DEPARTMENT_OTHER): Payer: Medicare Other | Admitting: Oncology

## 2014-06-19 ENCOUNTER — Other Ambulatory Visit (HOSPITAL_BASED_OUTPATIENT_CLINIC_OR_DEPARTMENT_OTHER): Payer: Medicare Other

## 2014-06-19 DIAGNOSIS — Z853 Personal history of malignant neoplasm of breast: Secondary | ICD-10-CM | POA: Diagnosis not present

## 2014-06-19 LAB — CBC WITH DIFFERENTIAL/PLATELET
BASO%: 0.8 % (ref 0.0–2.0)
BASOS ABS: 0.1 10*3/uL (ref 0.0–0.1)
EOS ABS: 0.3 10*3/uL (ref 0.0–0.5)
EOS%: 2.8 % (ref 0.0–7.0)
HEMATOCRIT: 35.9 % (ref 34.8–46.6)
HEMOGLOBIN: 11.6 g/dL (ref 11.6–15.9)
LYMPH%: 29.2 % (ref 14.0–49.7)
MCH: 32 pg (ref 25.1–34.0)
MCHC: 32.4 g/dL (ref 31.5–36.0)
MCV: 98.8 fL (ref 79.5–101.0)
MONO#: 0.9 10*3/uL (ref 0.1–0.9)
MONO%: 9 % (ref 0.0–14.0)
NEUT%: 58.2 % (ref 38.4–76.8)
NEUTROS ABS: 6 10*3/uL (ref 1.5–6.5)
PLATELETS: 295 10*3/uL (ref 145–400)
RBC: 3.63 10*6/uL — ABNORMAL LOW (ref 3.70–5.45)
RDW: 13.1 % (ref 11.2–14.5)
WBC: 10.3 10*3/uL (ref 3.9–10.3)
lymph#: 3 10*3/uL (ref 0.9–3.3)

## 2014-06-19 LAB — IRON AND TIBC CHCC
%SAT: 83 % — AB (ref 21–57)
Iron: 137 ug/dL (ref 41–142)
TIBC: 165 ug/dL — ABNORMAL LOW (ref 236–444)
UIBC: 29 ug/dL — ABNORMAL LOW (ref 120–384)

## 2014-06-19 LAB — FERRITIN CHCC: FERRITIN: 243 ng/mL (ref 9–269)

## 2014-06-19 NOTE — Progress Notes (Signed)
OFFICE PROGRESS NOTE  CC Tammy Comment, MD  Tammy Haste, MD [place] [address]  DIAGNOSIS: 80 year old woman with the following issues:  1. Right breast cancer diagnosed November 2000. She has continued to be disease free at this time.  2. hemochromastosis diagnosed 2008, phlebotomies to keep ferritin below 500. She has not had any phlebotomy in the last few years.   CURRENT THERAPY:Phlebotomies as needed. Mostly observation and surveillance.   INTERVAL HISTORY: Tammy Lloyd 80 y.o. female returns for follow up visit by herself.   she is a pleasant woman that used to be seen by Dr. Humphrey Lloyd for the above issues. Since the last visit, she had not had any new complaints. She denies headache, vision changes, swelling, chest pain, shortness of breath. She does not report any headaches or blurry vision or syncope or seizure. She does not report any fevers, chills sweats or weight loss. She does not report any nausea, vomiting or abdominal pain. Rest of her review of systems unremarkable.    MEDICAL HISTORY: Past Medical History  Diagnosis Date  . Arthritis   . Hemorrhoid   . Cataract   . Cancer     Breast  . Hypertension   . Hemochromatosis     Followed by  hematology;  . Colon polyp   . Idiopathic hemochromatosis 09/28/2011  . Pulmonary hypertension   . Edema of both legs   . Lichen sclerosus   . Osteoporosis 2005    DEXA 2015 T score -1.7 stable from prior DEXA 2011 history of Fosamax 8 years. On drug-free holiday now    ALLERGIES:  has No Known Allergies.  MEDICATIONS:  Current Outpatient Prescriptions  Medication Sig Dispense Refill  . acetaminophen (TYLENOL) 325 MG tablet Take 650 mg by mouth every 6 (six) hours as needed. For pain    . amLODipine (NORVASC) 10 MG tablet Take 1 tablet by mouth  daily 90 tablet 1  . Cholecalciferol (VITAMIN D) 1000 UNITS capsule Take 1,000 Units by mouth daily.     Marland Kitchen  losartan-hydrochlorothiazide (HYZAAR) 100-25 MG per tablet Take 1 tablet by mouth  daily 90 tablet 1  . pravastatin (PRAVACHOL) 20 MG tablet Take 1 tablet (20 mg total) by mouth daily. 90 tablet 1  . Prednicarbate 0.1 % CREA Apply 1 application topically 2 (two) times daily as needed. For itching 60 g 1  . Rivaroxaban (XARELTO) 20 MG TABS tablet Take 1 tablet (20 mg total) by mouth daily with supper. 90 tablet 3   No current facility-administered medications for this visit.   ECOG 1 General: Patient is a well appearing female in no acute distress HEENT: PERRLA, sclerae anicteric no conjunctival pallor, MMM Neck: supple, no palpable adenopathy Lungs: clear to auscultation bilaterally, no wheezes, rhonchi, or rales Cardiovascular: regular rate rhythm, S1, S2, no murmurs, rubs or gallops Abdomen: Soft, non-tender, non-distended, normoactive bowel sounds, no HSM Extremities: warm and well perfused, no clubbing, cyanosis, or edema Skin: No rashes or lesions Neuro: Grossly normal     LABORATORY DATA: Lab Results  Component Value Date   WBC 10.3 06/19/2014   HGB 11.6 06/19/2014   HCT 35.9 06/19/2014   MCV 98.8 06/19/2014   PLT 295 06/19/2014      Chemistry      Component Value Date/Time   NA 138 10/05/2013 1747   NA 139 06/10/2013 0936   NA 139 04/08/2010 1455   K 4.4 10/05/2013 1747  K 4.7 06/10/2013 0936   K 4.8* 04/08/2010 1455   CL 98 10/05/2013 1747   CL 106 09/30/2012 0913   CL 104 04/08/2010 1455   CO2 22 10/05/2013 1747   CO2 24 06/10/2013 0936   CO2 28 04/08/2010 1455   BUN 27* 10/05/2013 1747   BUN 27.8* 06/10/2013 0936   BUN 32* 04/08/2010 1455   CREATININE 1.14* 10/05/2013 1747   CREATININE 1.2* 06/10/2013 0936   CREATININE 1.06 12/19/2010 1617      Component Value Date/Time   CALCIUM 9.5 10/05/2013 1747   CALCIUM 9.4 06/10/2013 0936   CALCIUM 9.0 04/08/2010 1455   ALKPHOS 92 10/05/2013 1747   ALKPHOS 77 01/02/2013 0918   ALKPHOS 78 04/08/2010 1455    AST 16 10/05/2013 1747   AST 13 01/02/2013 0918   AST 27 04/08/2010 1455   ALT 8 10/05/2013 1747   ALT 7 01/02/2013 0918   ALT 18 04/08/2010 1455   BILITOT 0.3 10/05/2013 1747   BILITOT 0.31 01/02/2013 0918   BILITOT 0.40 04/08/2010 1455        ASSESSMENT: 80 year old with:  1. Breast cancer: Her diagnosis have been remote without any evidence of recurrence at least 16 years later. No active intervention is needed. She is currently receiving maintenance mammograms the last one performed in November 2015.   2. Hemochromatosis: Her hemoglobin have been adequate with ferritin level of less then 500 since 2013. I do not really see this be a potential problem for her moving forward. She have not had a phlebotomy done at least for the last 3 years. She is completely asymptomatic and have been. I do not recommend any further intervention at this time and I will be happy to see her in the future as needed.    Tammy Button, MD Medical/Oncology [place] [phone] (Office)  06/19/2014, 2:54 PM

## 2014-09-07 ENCOUNTER — Other Ambulatory Visit: Payer: Self-pay | Admitting: Family Medicine

## 2014-10-19 ENCOUNTER — Telehealth: Payer: Self-pay | Admitting: Internal Medicine

## 2014-10-19 NOTE — Telephone Encounter (Signed)
Pt states that her feet has been swelling for a couple months and are getting worse and her face has been itching for months as well. Pt thinks its coming from the pravastatin. Please advise

## 2014-10-19 NOTE — Telephone Encounter (Signed)
Pt has appointment 

## 2014-10-19 NOTE — Telephone Encounter (Signed)
I doubt that the pravastatin is causing this. Have her set up an appointment

## 2014-10-20 ENCOUNTER — Encounter: Payer: Self-pay | Admitting: Family Medicine

## 2014-10-20 ENCOUNTER — Ambulatory Visit: Payer: Self-pay | Admitting: Family Medicine

## 2014-10-20 ENCOUNTER — Ambulatory Visit (INDEPENDENT_AMBULATORY_CARE_PROVIDER_SITE_OTHER): Payer: Medicare Other | Admitting: Family Medicine

## 2014-10-20 VITALS — BP 132/70 | HR 76 | Wt 178.0 lb

## 2014-10-20 DIAGNOSIS — R609 Edema, unspecified: Secondary | ICD-10-CM | POA: Diagnosis not present

## 2014-10-20 DIAGNOSIS — L309 Dermatitis, unspecified: Secondary | ICD-10-CM | POA: Diagnosis not present

## 2014-10-20 NOTE — Patient Instructions (Signed)
Lets use support hose and stop that facial cream

## 2014-10-20 NOTE — Progress Notes (Signed)
   Subjective:    Patient ID: Tammy Lloyd, female    DOB: 1923/08/12, 81 y.o.   MRN: [id number]  HPI She is here for bilateral lower extremity edema that began 3 months ago with only the right lower leg and foot being swollen and then 3-4 weeks later she noticed the left lower leg and foot was also swollen. She states the swelling is some better in the morning and gradually worsens throughout the day. She denies pain or aches in lower extremities. Denies numbness, tingling, weakness, chest pain, DOE, orthopnea, or cough. She also complains of dry patches that itch mildly to bilateral lower legs x 1 week and has been using Neosporin for this.    She also complains of a 3 week history of intermittent facial itching.Denies fever, chills, or rash to face. She recalls switching facial cream approximately one month ago and thinks this could be related.  Medications and social history reviewed.  Review of Systems     Objective:   Physical Exam  Alert and in no distress. Throat is clear. Cardiac exam shows an irregular rhythm. Lungs are clear to auscultation, respiratory effort normal. Lower extremities warm and dry. erythematous dry plaque-like lesions to bilateral LE located above the ankles to mid-shin. normal pulses, sensation, and strength. 2+-3+ pitting edema to bilateral feet and ankles. DTRs normal.       Assessment & Plan:   Dependent edema  Continue current medications. Her edema does not appear to be cardiac related. Recommend using support stockings for her bilateral dependent edema. Stop using the facial cream and see if the itching stops.

## 2014-10-28 ENCOUNTER — Other Ambulatory Visit: Payer: Self-pay | Admitting: Family Medicine

## 2014-11-27 ENCOUNTER — Telehealth: Payer: Self-pay | Admitting: Family Medicine

## 2014-11-27 NOTE — Telephone Encounter (Signed)
I went over the message from Dr. Redmond School and the patient states that the compression hose aren't really helping. Since, she stop the Pravastin she wants to know if she needs a replacement medication. She states that she will call back since Dr. Redmond School is out of town.

## 2014-11-27 NOTE — Telephone Encounter (Signed)
Pt called and stated that she continues to have issues with swelling. She states she did all the things you recommended and it didn't help. She stopped taking Pravastatin and the swelling went down. She would like to take something else. Pt uses [facility] and can be reached at [phone]. She is currently NOT taking the Pravastatin.

## 2014-11-27 NOTE — Telephone Encounter (Signed)
[place] she can do is wear support stockings to help with her edema

## 2014-12-01 ENCOUNTER — Other Ambulatory Visit: Payer: Self-pay | Admitting: Family Medicine

## 2015-03-01 ENCOUNTER — Other Ambulatory Visit: Payer: Self-pay | Admitting: Family Medicine

## 2015-04-28 ENCOUNTER — Ambulatory Visit (INDEPENDENT_AMBULATORY_CARE_PROVIDER_SITE_OTHER): Payer: Medicare Other | Admitting: Medical

## 2015-04-28 ENCOUNTER — Encounter: Payer: Self-pay | Admitting: Medical

## 2015-04-28 VITALS — BP 110/50 | HR 68 | Temp 98.0°F | Wt 168.0 lb

## 2015-04-28 DIAGNOSIS — T887XXA Unspecified adverse effect of drug or medicament, initial encounter: Secondary | ICD-10-CM

## 2015-04-28 DIAGNOSIS — T50905A Adverse effect of unspecified drugs, medicaments and biological substances, initial encounter: Secondary | ICD-10-CM

## 2015-04-28 DIAGNOSIS — Z8673 Personal history of transient ischemic attack (TIA), and cerebral infarction without residual deficits: Secondary | ICD-10-CM | POA: Diagnosis not present

## 2015-04-28 DIAGNOSIS — L299 Pruritus, unspecified: Secondary | ICD-10-CM

## 2015-04-28 LAB — COMPREHENSIVE METABOLIC PANEL
ALBUMIN: 4 g/dL (ref 3.6–5.1)
ALK PHOS: 74 U/L (ref 33–130)
ALT: 8 U/L (ref 6–29)
AST: 17 U/L (ref 10–35)
BUN: 29 mg/dL — AB (ref 7–25)
CHLORIDE: 98 mmol/L (ref 98–110)
CO2: 26 mmol/L (ref 20–31)
CREATININE: 1.34 mg/dL — AB (ref 0.60–0.88)
Calcium: 9 mg/dL (ref 8.6–10.4)
Glucose, Bld: 100 mg/dL — ABNORMAL HIGH (ref 65–99)
Potassium: 4.4 mmol/L (ref 3.5–5.3)
SODIUM: 135 mmol/L (ref 135–146)
TOTAL PROTEIN: 6.6 g/dL (ref 6.1–8.1)
Total Bilirubin: 0.4 mg/dL (ref 0.2–1.2)

## 2015-04-28 LAB — CBC WITH DIFFERENTIAL/PLATELET
BASOS ABS: 0.1 10*3/uL (ref 0.0–0.1)
Basophils Relative: 1 % (ref 0–1)
EOS PCT: 5 % (ref 0–5)
Eosinophils Absolute: 0.5 10*3/uL (ref 0.0–0.7)
HEMATOCRIT: 34.5 % — AB (ref 36.0–46.0)
HEMOGLOBIN: 11.5 g/dL — AB (ref 12.0–15.0)
LYMPHS ABS: 2.6 10*3/uL (ref 0.7–4.0)
LYMPHS PCT: 28 % (ref 12–46)
MCH: 32.8 pg (ref 26.0–34.0)
MCHC: 33.3 g/dL (ref 30.0–36.0)
MCV: 98.3 fL (ref 78.0–100.0)
MPV: 10.9 fL (ref 8.6–12.4)
Monocytes Absolute: 1.2 10*3/uL — ABNORMAL HIGH (ref 0.1–1.0)
Monocytes Relative: 13 % — ABNORMAL HIGH (ref 3–12)
NEUTROS ABS: 4.9 10*3/uL (ref 1.7–7.7)
Neutrophils Relative %: 53 % (ref 43–77)
Platelets: 313 10*3/uL (ref 150–400)
RBC: 3.51 MIL/uL — AB (ref 3.87–5.11)
RDW: 13.7 % (ref 11.5–15.5)
WBC: 9.3 10*3/uL (ref 4.0–10.5)

## 2015-04-28 LAB — CBC
HCT: 34.5 % — ABNORMAL LOW (ref 36.0–46.0)
Hemoglobin: 11.5 g/dL — ABNORMAL LOW (ref 12.0–15.0)
MCH: 32.8 pg (ref 26.0–34.0)
MCHC: 33.3 g/dL (ref 30.0–36.0)
MCV: 98.3 fL (ref 78.0–100.0)
MPV: 10.9 fL (ref 8.6–12.4)
PLATELETS: 313 10*3/uL (ref 150–400)
RBC: 3.51 MIL/uL — ABNORMAL LOW (ref 3.87–5.11)
RDW: 13.7 % (ref 11.5–15.5)
WBC: 9.3 10*3/uL (ref 4.0–10.5)

## 2015-04-28 LAB — IRON: IRON: 125 ug/dL (ref 45–160)

## 2015-04-28 LAB — FERRITIN: FERRITIN: 292 ng/mL — AB (ref 10–291)

## 2015-04-28 NOTE — Progress Notes (Signed)
Subjective: Here for c/o itching.  Started in scalp several months ago, then gradually went to face, nose, ankles, and last night under breasts and stomach.  She discussed with Dr. Redmond School months ago, and he advised she wears compression stockings and change to different facial cream, which she has done, but didn't change the itching.  She is convinced its related to the pravastatin.   No other new exposures, no other new body hygiene productus ,not using scented detergent, no other aggravating or relieving factors. No other aggravating or relieving factors. No other complaint.  Past Medical History  Diagnosis Date  . Arthritis   . Hemorrhoid   . Cataract   . Cancer (HCC)     Breast  . Hypertension   . Hemochromatosis     Followed by  hematology;  . Colon polyp   . Idiopathic hemochromatosis ([place]) 09/28/2011  . Pulmonary hypertension ([place])   . Edema of both legs   . Lichen sclerosus   . Osteoporosis 2005    DEXA 2015 T score -1.7 stable from prior DEXA 2011 history of Fosamax 8 years. On drug-free holiday now   ROS as in subjective   Objective: BP 110/50 mmHg  Pulse 68  Temp(Src) 98 F (36.7 C)  Wt 168 lb (76.204 kg)  SpO2 98%  Gen: wd, wn, nad Left medial lower leg just proximal to ankle with 3cm x 2cm patch of pink/red irritated skin but no distinct crusting, scaling, induration, fluctuance or warmth, rash is nonspecific No other skin lesions of concern in the area of the reported itching     Assessment; Encounter Diagnoses  Name Primary?  . Pruritic condition Yes  . Adverse drug reaction, initial encounter   . History of stroke   . Idiopathic hemochromatosis ([place])     Plan: Source of the itching is not clear.  Its possible that a medication is causing this.  No distinct rash today other than on left lower leg.  She also has hx/o hemochromatosis.  Labs today.  If labs normal, consider trial off Pravastatin.   Itching is listing as possible side effects of Xarelto as  well.  So we have to consider other medications that could cause this as well.  F/u pending labs

## 2015-05-06 ENCOUNTER — Encounter: Payer: Self-pay | Admitting: Family Medicine

## 2015-05-06 ENCOUNTER — Ambulatory Visit (INDEPENDENT_AMBULATORY_CARE_PROVIDER_SITE_OTHER): Payer: Medicare Other | Admitting: Family Medicine

## 2015-05-06 VITALS — BP 130/58 | HR 64 | Wt 164.0 lb

## 2015-05-06 DIAGNOSIS — L299 Pruritus, unspecified: Secondary | ICD-10-CM

## 2015-05-06 DIAGNOSIS — L309 Dermatitis, unspecified: Secondary | ICD-10-CM | POA: Diagnosis not present

## 2015-05-06 NOTE — Progress Notes (Signed)
   Subjective:    Patient ID: Tammy Lloyd, female    DOB: [birth year], 81 y.o.   MRN: [id number]  HPI She is here for evaluation of continued difficulty with rash and itching. She thinks it is her surround total doing it. She complains of itching and dryness on her arms, underneath both breasts and on her back mainly in the area of her panty line.   Review of Systems     Objective:   Physical Exam Alert and in no distress. Dryness is noted on both forearms with slight erythema. Also erythema under both breasts and mainly on her back in the patchy line area although she does have a above that as well. No lesions noted on her face or lower extremity.       Assessment & Plan:  Pruritic condition - Plan: Ambulatory referral to Dermatology  Dermatitis - Plan: Ambulatory referral to Dermatology  Exact etiology of this is unclear. I will refer to dermatology

## 2015-05-12 DIAGNOSIS — L309 Dermatitis, unspecified: Secondary | ICD-10-CM | POA: Diagnosis not present

## 2015-05-20 ENCOUNTER — Ambulatory Visit (INDEPENDENT_AMBULATORY_CARE_PROVIDER_SITE_OTHER): Payer: Medicare Other | Admitting: Gynecology

## 2015-05-20 ENCOUNTER — Encounter: Payer: Self-pay | Admitting: Gynecology

## 2015-05-20 VITALS — BP 130/70 | Ht 64.5 in | Wt 165.0 lb

## 2015-05-20 DIAGNOSIS — Z01419 Encounter for gynecological examination (general) (routine) without abnormal findings: Secondary | ICD-10-CM

## 2015-05-20 DIAGNOSIS — L900 Lichen sclerosus et atrophicus: Secondary | ICD-10-CM

## 2015-05-20 DIAGNOSIS — N952 Postmenopausal atrophic vaginitis: Secondary | ICD-10-CM

## 2015-05-20 DIAGNOSIS — M8580 Other specified disorders of bone density and structure, unspecified site: Secondary | ICD-10-CM | POA: Diagnosis not present

## 2015-05-20 NOTE — Patient Instructions (Signed)
Follow up in one year, sooner as needed. 

## 2015-05-20 NOTE — Progress Notes (Signed)
Tammy Lloyd [birth year] [id number]        81 y.o.  G1P1 for breast and pelvic exam  Past medical history,surgical history, problem list, medications, allergies, family history and social history were all reviewed and documented as reviewed in the EPIC chart.  ROS:  Performed with pertinent positives and negatives included in the history, assessment and plan.   Additional significant findings :  none   Exam: Kim Counsellor Vitals:   05/20/15 1130  BP: 130/70  Height: 5' 4.5" (1.638 m)  Weight: 165 lb (74.844 kg)   General appearance:  Normal affect, orientation and appearance. Skin: Grossly normal HEENT: Without gross lesions.  No cervical or supraclavicular adenopathy. Thyroid normal.  Lungs:  Clear without wheezing, rales or rhonchi Cardiac: RR, without RMG Abdominal:  Soft, nontender, without masses, guarding, rebound, organomegaly or hernia Breasts:  Examined lying and sitting. Left without masses, retractions, discharge or axillary adenopathy.  Right with lumpectomy scars well-healed. Without masses retractions discharge adenopathy Pelvic:  Ext/BUS/vagina with generalized atrophic changes. Perivaginal whitening consistent with her history of lichen sclerosis  Cervix nonvisualized due to stenosis  Uterus not palpated. Without gross masses or tenderness  Adnexa  Without gross masses or tenderness    Anus and perineum  Normal   Rectovaginal  Normal sphincter tone without palpated masses or tenderness.    Assessment/Plan:  81 y.o. G1P1 female for breast and pelvic exam.   1. Postmenopausal. Patient without significant hot flushes night sweats or any vaginal bleeding. Continue to monitor and report issues or vaginal bleeding. 2. History of lichen sclerosis.  Using Prenicarbate 0.1% doing well with this intermittently. Has replaced Temovate with this and does better with this. Has supply at home but will call when she needs more. 3. Osteoporosis.  DEXA 2015 T score -1.7 stable  from prior DEXA.  Used Fosamax for approximately 8 years and is on drug-free holiday now. Plan repeat DEXA another year or 2. 4. History of right breast cancer.   Exam NED. Mammography scheduled and she will follow up for this. SBE monthly reviewed. 5. Pap smear 2008. No Pap smear done today. No history of significant abnormal Pap smears. Per current screening guidelines based on age we both agree to stop screening. 6. Colposcopy 2009. Repeat at their recommended interval. 7. Health maintenance. No routine lab work done as patient reports is done at her primary physician's office. Follow up 1 year, sooner as needed.   Anastasio Auerbach MD, 12:06 PM 05/20/2015

## 2015-05-25 ENCOUNTER — Other Ambulatory Visit: Payer: Self-pay

## 2015-05-25 ENCOUNTER — Telehealth: Payer: Self-pay | Admitting: Family Medicine

## 2015-05-25 DIAGNOSIS — Z1231 Encounter for screening mammogram for malignant neoplasm of breast: Secondary | ICD-10-CM | POA: Diagnosis not present

## 2015-05-25 DIAGNOSIS — Z853 Personal history of malignant neoplasm of breast: Secondary | ICD-10-CM | POA: Diagnosis not present

## 2015-05-25 LAB — HM MAMMOGRAPHY

## 2015-05-25 MED ORDER — APIXABAN 2.5 MG PO TABS: 2.5000 mg | ORAL_TABLET | Freq: Two times a day (BID) | ORAL | Status: DC

## 2015-05-25 NOTE — Telephone Encounter (Signed)
Tell her to throw away the [place]. Let her know that I called in a different medication that requires that she take it twice per day. If she has problems with that, specifically bleeding, let us know

## 2015-05-25 NOTE — Telephone Encounter (Signed)
Apparently XARELTO is causing the problem with itching. I will switch her to low-dose Eliquis.

## 2015-05-25 NOTE — Telephone Encounter (Signed)
Pt informed

## 2015-05-25 NOTE — Telephone Encounter (Signed)
Tammy Lloyd called and states she has stopped the Xarelto on 11/3 and her itching is much improved.  She had also came off of the prevastatin and it made no change. Do you want her to start the previstatin again? Do you want her to give her something to replace the Xarelto?  Please let Nonda know [phone]

## 2015-05-25 NOTE — Telephone Encounter (Signed)
Returned call to pt from voice message, no answer lmtrc.

## 2015-05-26 ENCOUNTER — Encounter: Payer: Self-pay | Admitting: Gynecology

## 2015-05-26 ENCOUNTER — Encounter: Payer: Self-pay | Admitting: Family Medicine

## 2015-05-31 ENCOUNTER — Ambulatory Visit: Payer: Self-pay | Admitting: Family Medicine

## 2015-06-01 ENCOUNTER — Telehealth: Payer: Self-pay | Admitting: Family Medicine

## 2015-06-07 NOTE — Telephone Encounter (Signed)
Called pharmacy and Eliquis went thru for $161.88 a month.  Faxed copy of free 30 day trial which will take care of for the first month.  The $10 discount card doesn't work with medicare so she will have to pay the $161 thereafter  Pt informed

## 2015-06-07 NOTE — Telephone Encounter (Signed)
Also mailed pt print out of [place]  t# for possible help with her medications

## 2015-06-18 ENCOUNTER — Encounter: Payer: Self-pay | Admitting: Family Medicine

## 2015-07-15 ENCOUNTER — Other Ambulatory Visit: Payer: Self-pay | Admitting: Family Medicine

## 2015-08-19 ENCOUNTER — Other Ambulatory Visit: Payer: Self-pay

## 2015-08-19 MED ORDER — PREDNICARBATE 0.1 % EX CREA: 1.0000 "application " | TOPICAL_CREAM | Freq: Two times a day (BID) | CUTANEOUS | Status: DC | PRN

## 2015-10-14 ENCOUNTER — Ambulatory Visit (INDEPENDENT_AMBULATORY_CARE_PROVIDER_SITE_OTHER): Payer: Medicare Other | Admitting: Family Medicine

## 2015-10-14 VITALS — BP 138/70 | HR 76 | Temp 98.0°F | Ht 65.0 in | Wt 166.0 lb

## 2015-10-14 DIAGNOSIS — L299 Pruritus, unspecified: Secondary | ICD-10-CM | POA: Diagnosis not present

## 2015-10-14 DIAGNOSIS — L309 Dermatitis, unspecified: Secondary | ICD-10-CM

## 2015-10-14 NOTE — Progress Notes (Signed)
   Subjective:    Patient ID: Tammy Lloyd, female    DOB: [birth year], 82 y.o.   MRN: [id number]  HPI She is here for consult concerning continued difficulty with a rash. She has tried various over-the-counter medications in the past was seen by the PA with Dr. Allyson Sabal however continues to have difficulty with intermittent itching and rash.  Review of Systems     Objective:   Physical Exam Scattered erythematous dry lesions are noted on her arms and both knees. Patient is spared.       Assessment & Plan:  Pruritic condition - Plan: Ambulatory referral to Dermatology  Dermatitis - Plan: Ambulatory referral to Dermatology Etiology of this is unclear. I will refer to Dr. Allyson Sabal for him to evaluate this.

## 2015-10-14 NOTE — Patient Instructions (Signed)
It really itches put a cold rag on

## 2015-11-08 DIAGNOSIS — L309 Dermatitis, unspecified: Secondary | ICD-10-CM | POA: Diagnosis not present

## 2016-01-19 ENCOUNTER — Other Ambulatory Visit: Payer: Self-pay | Admitting: Family Medicine

## 2016-02-16 DIAGNOSIS — Z961 Presence of intraocular lens: Secondary | ICD-10-CM | POA: Diagnosis not present

## 2016-02-16 DIAGNOSIS — H35373 Puckering of macula, bilateral: Secondary | ICD-10-CM | POA: Diagnosis not present

## 2016-02-16 DIAGNOSIS — H353131 Nonexudative age-related macular degeneration, bilateral, early dry stage: Secondary | ICD-10-CM | POA: Diagnosis not present

## 2016-03-07 ENCOUNTER — Other Ambulatory Visit: Payer: Self-pay

## 2016-05-05 ENCOUNTER — Other Ambulatory Visit: Payer: Self-pay

## 2016-05-05 MED ORDER — PREDNICARBATE 0.1 % EX CREA: 1.0000 "application " | TOPICAL_CREAM | Freq: Two times a day (BID) | CUTANEOUS | 1 refills | Status: DC | PRN

## 2016-05-15 ENCOUNTER — Telehealth: Payer: Self-pay | Admitting: Family Medicine

## 2016-05-15 NOTE — Telephone Encounter (Signed)
Have her stop them for a week and see if it makes a difference and if not, start back on them if there is a difference, have her make an appointment

## 2016-05-15 NOTE — Telephone Encounter (Signed)
Pt notified of recommendations. She will stop BP for a week, she will call us with update. Victorino December

## 2016-05-15 NOTE — Telephone Encounter (Signed)
Pt called and left message that she has been up here several times in the past about her itching.  She wants to come off of her blood pressure medication and see if this will stop the itching.  Do you want her to try a different one? Pt ph [phone]

## 2016-05-22 ENCOUNTER — Encounter: Payer: Self-pay | Admitting: Gynecology

## 2016-05-22 ENCOUNTER — Ambulatory Visit (INDEPENDENT_AMBULATORY_CARE_PROVIDER_SITE_OTHER): Payer: Medicare Other | Admitting: Gynecology

## 2016-05-22 VITALS — BP 124/78 | Ht 65.0 in | Wt 157.0 lb

## 2016-05-22 DIAGNOSIS — Z01411 Encounter for gynecological examination (general) (routine) with abnormal findings: Secondary | ICD-10-CM | POA: Diagnosis not present

## 2016-05-22 DIAGNOSIS — L900 Lichen sclerosus et atrophicus: Secondary | ICD-10-CM

## 2016-05-22 DIAGNOSIS — N952 Postmenopausal atrophic vaginitis: Secondary | ICD-10-CM

## 2016-05-22 DIAGNOSIS — C50911 Malignant neoplasm of unspecified site of right female breast: Secondary | ICD-10-CM

## 2016-05-22 DIAGNOSIS — M810 Age-related osteoporosis without current pathological fracture: Secondary | ICD-10-CM

## 2016-05-22 NOTE — Patient Instructions (Signed)

## 2016-05-22 NOTE — Progress Notes (Signed)
    Tammy Lloyd 27-Apr-1924 [id number]        82 y.o.  G1P1  for breast and pelvic exam.  Past medical history,surgical history, problem list, medications, allergies, family history and social history were all reviewed and documented as reviewed in the EPIC chart.  ROS:  Performed with pertinent positives and negatives included in the history, assessment and plan.   Additional significant findings :  None   Exam: Caryn Bee assistant Vitals:   05/22/16 1033  BP: 124/78  Weight: 157 lb (71.2 kg)  Height: 5\' 5"  (1.651 m)   Body mass index is 26.13 kg/m.  General appearance:  Normal affect, orientation and appearance. Skin: Grossly normal HEENT: Without gross lesions.  No cervical or supraclavicular adenopathy. Thyroid normal.  Lungs:  Clear without wheezing, rales or rhonchi Cardiac: RR, without RMG Abdominal:  Soft, nontender, without masses, guarding, rebound, organomegaly or hernia Breasts:  Examined lying and sitting without masses, retractions, discharge or axillary adenopathy. Well-healed right lumpectomy scar Pelvic:  Ext, BUS, Vagina with atrophic changes. Perivaginal blanching of the skin consistent with history of lichen sclerosis. Significant shortening of the vagina  Cervix not visualized due to vaginal stenosis  Uterus not palpated. No gross masses or tenderness.  Adnexa without masses or tenderness    Anus and perineum normal   Rectovaginal normal sphincter tone without palpated masses or tenderness.    Assessment/Plan:  82 y.o. G1P1 female for breast and pelvic exam  1. Postmenopausal/atrophic genital changes. No significant hot flushes, night sweats, vaginal dryness or any vaginal bleeding. 2. History of lichen sclerosis.  Uses Prenicarbate 0.1% cream with good results. Has supply at home but will call when needs refill. 3. Osteoporosis. DEXA 2015 T score -1.7 stable from prior DEXA. History of Fosamax for 8 years, on drug-free holiday.  Discussed repeating  DEXA now the patient's reluctant and prefers to wait next year. She understands the risks of fracture devastating outcome but does not want to schedule her DEXA now. 4. History of right breast cancer.  Exam NED.  Mammography due now on patients going to schedule. SBE monthly reviewed. 5. Colonoscopy 2009. Repeat at their recommended interval. 6. Pap smear 2008. No Pap smear done today. Per current screening guidelines based on age and no history of significant abnormalities we both agree to stop screening. 7. Health maintenance. No routine lab work done as this is done elsewhere. Follow up 1 year, sooner as needed.   Anastasio Auerbach MD, 10:58 AM 05/22/2016

## 2016-05-25 ENCOUNTER — Encounter: Payer: Self-pay | Admitting: Family Medicine

## 2016-05-25 ENCOUNTER — Ambulatory Visit (INDEPENDENT_AMBULATORY_CARE_PROVIDER_SITE_OTHER): Payer: Medicare Other | Admitting: Family Medicine

## 2016-05-25 VITALS — BP 152/80 | HR 68 | Temp 99.2°F | Ht 65.0 in | Wt 157.4 lb

## 2016-05-25 DIAGNOSIS — I10 Essential (primary) hypertension: Secondary | ICD-10-CM

## 2016-05-25 DIAGNOSIS — R21 Rash and other nonspecific skin eruption: Secondary | ICD-10-CM | POA: Diagnosis not present

## 2016-05-25 DIAGNOSIS — L299 Pruritus, unspecified: Secondary | ICD-10-CM | POA: Diagnosis not present

## 2016-05-25 MED ORDER — MUPIROCIN CALCIUM 2 % EX CREA: 1.0000 "application " | TOPICAL_CREAM | Freq: Two times a day (BID) | CUTANEOUS | 0 refills | Status: DC

## 2016-05-25 MED ORDER — LOSARTAN POTASSIUM 100 MG PO TABS: 100.0000 mg | ORAL_TABLET | Freq: Every day | ORAL | 0 refills | Status: DC

## 2016-05-25 NOTE — Progress Notes (Signed)
Chief Complaint  Patient presents with  . Itching    for over a year, not going away. Thought it was her amlodipine-stopped taking it for a week. Saw GYN Monday and BP was fine. Tuesday her BP was elevated. Yesterday took the amlodipine, did not take one today. Itching was the same while off of amlodipine. Wonders if it could be her losartan instead?  . Flu Vaccine    declined.     Last seen by Dr. Redmond School for rash/itching in April. She was referred to Dr. Allyson Sabal, who rx'd Mometasone (per notes in system). Pt thinks could be related to BP meds (also suggested by Dr. Allyson Sabal), so presents to discuss.  She has had ongoing rash and itching since then, without ever completely resolving.  Rash is at both ankles/lower legs, left hip, itches along low back (she can't see back there to know if there is a rash).  It was felt that her BP medications might be causing (see Dr. Ledell Peoples note), so she tried stopping the amlodipine for a week.  She didn't take it 11/7-11/14. She didn't notice any significant difference while off the medication.  BP was elevated on Tuesday (202--states her machine always reads 20 points higher than when at the doctor--inaccurate).  Her BP was normal at the GYN the day prior (when off meds x 6 days).  When she saw the high BP yesterday, she got alarmed, so took amlodipine yesterday (not today).  She read that losartan could have itching as a side effects, wonders if that one should be stopped.  Review of chart shows she is on losartan HCT, not plain losartan  Prescription steroid cream gives some temporary help.  PMH ,PSH, SH reviewed  Outpatient Encounter Prescriptions as of 05/25/2016  Medication Sig Note  . aspirin 81 MG tablet Take 81 mg by mouth daily.   . Cholecalciferol (VITAMIN D) 1000 UNITS capsule Take 1,000 Units by mouth daily. Reported on 10/14/2015   . clobetasol cream (TEMOVATE) [id number] % Apply 1 application topically 2 (two) times daily. Reported on 10/14/2015   .  losartan-hydrochlorothiazide (HYZAAR) 100-25 MG tablet Take 1 tablet by mouth  daily   . Multiple Vitamins-Minerals (PRESERVISION AREDS PO) Take 2 capsules by mouth daily.   . Prednicarbate 0.1 % CREA Apply 1 application topically 2 (two) times daily as needed. For itching   . acetaminophen (TYLENOL) 325 MG tablet Take 650 mg by mouth every 6 (six) hours as needed. Reported on 10/14/2015   . amLODipine (NORVASC) 10 MG tablet Take 1 tablet by mouth  daily (Patient not taking: Reported on 05/25/2016) 05/25/2016: Didn't take today.  Took one dose yesterday, otherwise had been taking daily until 05/16/16. Trial off to see if itching/rash improved  . [DISCONTINUED] apixaban (ELIQUIS) 2.5 MG TABS tablet Take 1 tablet (2.5 mg total) by mouth 2 (two) times daily. (Patient not taking: Reported on 05/22/2016)   . [DISCONTINUED] XARELTO 20 MG TABS tablet Take 1 tablet by mouth  daily with supper (Patient not taking: Reported on 05/22/2016)    No facility-administered encounter medications on file as of 05/25/2016.    ROS: Denies headaches, dizziness, chest pain, edema. Denies fever, chills, URI symptoms, cough, shortness of breath.  Denies skin lesions/sores/crusting, just the rash as per HPI.  PHYSICAL EXAM: BP (!) 152/80   Pulse 68   Temp 99.2 F (37.3 C) (Tympanic)   Ht 5\' 5"  (1.651 m)   Wt 157 lb 6.4 oz (71.4 kg)   BMI 26.19 kg/m  Well developed, pleasant female, appears younger than stated age, in no distress.  Scratching at her lower legs/ankles throughout the visit. Neck: no lymphadenopathy Heart: regular rate and rhythm Lungs: clear bilaterally Low back--a few scattered papules, some of which are excoriated. No erythema, crusting, warmth Left medial ankle-4x2.5cm patch of inflamed skin with scaling/flaking, pink. +warmth. Some deep excoriations noted. No crusting. Left lateral ankle--heart-shaped area of erythema and flaking, measuring 5 x 3cm Right anterior tib region--large patch (13-14cm  x 6cm in width) of flaky, dry rash, pink and warm to the touch.  Skin intact without crusting, nontender.  ASSESSMENT/PLAN:  Essential hypertension - restart amlodipine. d/c HCTZ--change to plain losartan. Monitor BP - Plan: losartan (COZAAR) 100 MG tablet  Rash and nonspecific skin eruption - nummular eczema vs allergic reaction to medication. HCTZ likely culprit given that it can also cause dry skin, making itch worse - Plan: mupirocin cream (BACTROBAN) 2 %  Itchy skin - supportive measures including moisturizing, antihistamines, steroid cream   Declines flu shot (offered/encouraged)   Restart the amlodipine. Stop the losartan HCT combination tablet that you have at home. Instead, start taking losartan 100mg  (this is one of the two ingredients that is in your current medicatoin). We are eliminating the HCTZ, which is the diuretic that can sometimes cause rash and itching.  Continue to monitor your blood pressure. If your home machine reads 20 points high, that isn't accurate and isn't a good way to keep an eye on things.  Either go to the pharmacy or elsewhere that has a monitor to have it checked, and record the values on a piece of paper, or consider getting a new monitor.  If you get a new monitor, bring it with you to have it checked to your next visit.  I recommend taking an antihistamine such as zyrtec or claritin once daily--this may help with the itching.  If it itches less, you may scratch at it less, which might help the rash some.  Please be sure to drink enough water. Moisturize the skin well (at least twice per day)--dry skin itches more. Apply the prescription cream to the patches of rash on the right skin and at both sides of the left ankle.  Apply a small amount twice daily for up to 10 days.  The area at the left inner ankle is scratched at a lot, and is at risk for getting an infection.  Please try and avoid scratching at your skin.  Use the prescription antibiotic cream  that is being prescribed today to this portion of your skin, and to any part of your skin that is scabbed (some of the areas on your low back as well).  This hopefully will eliminate you from getting a serious skin infection.  You are due for some routine, yearly bloodwork.  Let's plan on you doing this at your follow-up visit with Dr. Redmond School, since we are changing your medications today.  Flu shots are recommended.

## 2016-05-25 NOTE — Patient Instructions (Addendum)
  Restart the amlodipine. Stop the losartan HCT combination tablet that you have at home. Instead, start taking losartan 100mg  (this is one of the two ingredients that is in your current medicatoin)--new prescription was sent to your pharmacy today. We are eliminating the HCTZ, which is the diuretic that can sometimes cause rash and itching.  Continue to monitor your blood pressure. If your home machine reads 20 points high, that isn't accurate and isn't a good way to keep an eye on things.  Either go to the pharmacy or elsewhere that has a monitor to have it checked, and record the values on a piece of paper, or consider getting a new monitor.  If you get a new monitor, bring it with you to have it checked to your next visit.  I recommend taking an antihistamine such as zyrtec or claritin once daily--this may help with the itching.  If it itches less, you may scratch at it less, which might help the rash some.  Please be sure to drink enough water. Moisturize the skin well (at least twice per day)--dry skin itches more.  Apply the prescription cream to the patches of rash on the right skin and at both sides of the left ankle.  Apply a small amount twice daily for up to 10 days.  The area at the left inner ankle is scratched at a lot, and is at risk for getting an infection.  Please try and avoid scratching at your skin.  Use the prescription antibiotic cream that is being prescribed today to this portion of your skin, and to any part of your skin that is scabbed (some of the areas on your low back as well).  This hopefully will eliminate you from getting a serious skin infection.  You are due for some routine, yearly bloodwork.  Let's plan on you doing this at your follow-up visit with Dr. Redmond School, since we are changing your medications today.  Flu shots are recommended.

## 2016-05-30 ENCOUNTER — Telehealth: Payer: Self-pay | Admitting: Internal Medicine

## 2016-05-30 DIAGNOSIS — Z853 Personal history of malignant neoplasm of breast: Secondary | ICD-10-CM | POA: Diagnosis not present

## 2016-05-30 DIAGNOSIS — Z1231 Encounter for screening mammogram for malignant neoplasm of breast: Secondary | ICD-10-CM | POA: Diagnosis not present

## 2016-05-30 LAB — HM MAMMOGRAPHY

## 2016-05-30 NOTE — Telephone Encounter (Signed)
Pt STATES SHE has not FALLEN ANY THIS YEAR

## 2016-05-30 NOTE — Telephone Encounter (Signed)
Tried to call patient and left message   Info wanted to find out:  1- has she fallen any since her last visit with [place] 2- how many times has she fallen 3- what was the fall from

## 2016-06-13 ENCOUNTER — Encounter: Payer: Self-pay | Admitting: Family Medicine

## 2016-06-13 ENCOUNTER — Ambulatory Visit (INDEPENDENT_AMBULATORY_CARE_PROVIDER_SITE_OTHER): Payer: Medicare Other | Admitting: Family Medicine

## 2016-06-13 VITALS — BP 134/60 | HR 64 | Ht 65.0 in | Wt 165.0 lb

## 2016-06-13 DIAGNOSIS — I10 Essential (primary) hypertension: Secondary | ICD-10-CM | POA: Diagnosis not present

## 2016-06-13 DIAGNOSIS — R21 Rash and other nonspecific skin eruption: Secondary | ICD-10-CM | POA: Diagnosis not present

## 2016-06-13 NOTE — Progress Notes (Signed)
   Subjective:    Patient ID: Tammy Lloyd, female    DOB: Sep 19, 1923, 82 y.o.   MRN: [id number]  HPI She is here for a recheck. She has been on losartan for the last week and has noted no change in her itching. She would like to stop it entirely to see if that's causing the problem. She continues on amlodipine and is having no difficulty with that.   Review of Systems     Objective:   Physical Exam Alert and in no distress. Blood pressure is recorded.       Assessment & Plan:  Essential hypertension  Rash and nonspecific skin eruption I will have her stop the losartan for 1 week and then call me. I suspect that it will probably not make any difference. Also review of the record indicates she has not had a flu shot but she is not interested. She also needs a tetanus shot and I wrote a prescription to get it at the pharmacy.

## 2016-06-13 NOTE — Patient Instructions (Signed)
Continue on the amlodipine and stop the losartan for 1 week and then call me and let me know about the itching.

## 2016-06-21 ENCOUNTER — Telehealth: Payer: Self-pay | Admitting: Family Medicine

## 2016-06-21 NOTE — Telephone Encounter (Signed)
Pt called and advised itching is no better.  You took her off of a med to see if that was causing it and she can see no difference.  Please advise pt [phone]

## 2016-06-22 NOTE — Telephone Encounter (Signed)
Idabelle called this morning and states she stopped her ASA 81 mg and Vit D on Tuesday, Wed and today and she thinks she may be some better on the itching.  She will call us back next week.

## 2016-07-14 ENCOUNTER — Encounter: Payer: Self-pay | Admitting: Family Medicine

## 2016-07-14 ENCOUNTER — Ambulatory Visit (INDEPENDENT_AMBULATORY_CARE_PROVIDER_SITE_OTHER): Payer: Medicare Other | Admitting: Family Medicine

## 2016-07-14 VITALS — BP 150/50 | HR 63 | Wt 163.0 lb

## 2016-07-14 DIAGNOSIS — R609 Edema, unspecified: Secondary | ICD-10-CM | POA: Diagnosis not present

## 2016-07-14 DIAGNOSIS — L299 Pruritus, unspecified: Secondary | ICD-10-CM | POA: Diagnosis not present

## 2016-07-14 DIAGNOSIS — I10 Essential (primary) hypertension: Secondary | ICD-10-CM

## 2016-07-14 NOTE — Progress Notes (Signed)
   Subjective:    Patient ID: Tammy Lloyd, female    DOB: 1924/03/18, 83 y.o.   MRN: [id number]  HPI She is here for a recheck. She continues have difficulty with itching. She did stop taking losartan thinking that was causing itching however it did not make any difference. She is subsequently noted increased swelling in her lower extremities. No difficulty with chest pain, shortness of breath or PND.   Review of Systems     Objective:   Physical Exam Alert and in no distress. Blood pressure is recorded. Lower extremities do show 1-2 + edema.       Assessment & Plan:  Essential hypertension  Peripheral edema  Pruritic condition Recommend she start back on her losartan as well as continue on amlodipine. Explained that the amlodipine can cause some swelling and also her sitting for long periods of time also contributes to her swelling. Recommend she elevate her feet all she is sitting. Also recommend using skin moisturizer after she gets out of the shower and can also judiciously use cortisone cream on the areas that itch.

## 2016-07-14 NOTE — Patient Instructions (Signed)
Keep your feet elevated when you're sitting. Start taking that losartan again and see what that'll do to help.. After you finish your shower then put your lotion on. Try not to use a lot of salt as well. On the areas that itch a lot you can use cortisone cream

## 2016-07-20 ENCOUNTER — Inpatient Hospital Stay (HOSPITAL_COMMUNITY)
Admission: EM | Admit: 2016-07-20 | Discharge: 2016-07-22 | DRG: 062 | Disposition: A | Discharge status: Home Health | Payer: Medicare Other | Attending: Neurology | Admitting: Neurology

## 2016-07-20 ENCOUNTER — Encounter (HOSPITAL_COMMUNITY): Payer: Self-pay

## 2016-07-20 ENCOUNTER — Emergency Department (HOSPITAL_COMMUNITY): Payer: Medicare Other

## 2016-07-20 DIAGNOSIS — Z9119 Patient's noncompliance with other medical treatment and regimen: Secondary | ICD-10-CM | POA: Diagnosis not present

## 2016-07-20 DIAGNOSIS — Z7982 Long term (current) use of aspirin: Secondary | ICD-10-CM | POA: Diagnosis not present

## 2016-07-20 DIAGNOSIS — E785 Hyperlipidemia, unspecified: Secondary | ICD-10-CM | POA: Diagnosis present

## 2016-07-20 DIAGNOSIS — M810 Age-related osteoporosis without current pathological fracture: Secondary | ICD-10-CM | POA: Diagnosis present

## 2016-07-20 DIAGNOSIS — Z79899 Other long term (current) drug therapy: Secondary | ICD-10-CM

## 2016-07-20 DIAGNOSIS — I6789 Other cerebrovascular disease: Secondary | ICD-10-CM | POA: Diagnosis not present

## 2016-07-20 DIAGNOSIS — I481 Persistent atrial fibrillation: Secondary | ICD-10-CM | POA: Diagnosis present

## 2016-07-20 DIAGNOSIS — R29810 Facial weakness: Secondary | ICD-10-CM | POA: Diagnosis present

## 2016-07-20 DIAGNOSIS — I639 Cerebral infarction, unspecified: Secondary | ICD-10-CM

## 2016-07-20 DIAGNOSIS — G459 Transient cerebral ischemic attack, unspecified: Secondary | ICD-10-CM | POA: Diagnosis not present

## 2016-07-20 DIAGNOSIS — R001 Bradycardia, unspecified: Secondary | ICD-10-CM | POA: Diagnosis not present

## 2016-07-20 DIAGNOSIS — Z853 Personal history of malignant neoplasm of breast: Secondary | ICD-10-CM | POA: Diagnosis not present

## 2016-07-20 DIAGNOSIS — Z8673 Personal history of transient ischemic attack (TIA), and cerebral infarction without residual deficits: Secondary | ICD-10-CM | POA: Diagnosis not present

## 2016-07-20 DIAGNOSIS — R4701 Aphasia: Secondary | ICD-10-CM | POA: Diagnosis present

## 2016-07-20 DIAGNOSIS — R29702 NIHSS score 2: Secondary | ICD-10-CM | POA: Diagnosis present

## 2016-07-20 DIAGNOSIS — R29818 Other symptoms and signs involving the nervous system: Secondary | ICD-10-CM | POA: Diagnosis not present

## 2016-07-20 DIAGNOSIS — I10 Essential (primary) hypertension: Secondary | ICD-10-CM | POA: Diagnosis not present

## 2016-07-20 DIAGNOSIS — E782 Mixed hyperlipidemia: Secondary | ICD-10-CM | POA: Diagnosis not present

## 2016-07-20 DIAGNOSIS — R479 Unspecified speech disturbances: Secondary | ICD-10-CM | POA: Diagnosis not present

## 2016-07-20 DIAGNOSIS — I63412 Cerebral infarction due to embolism of left middle cerebral artery: Secondary | ICD-10-CM

## 2016-07-20 DIAGNOSIS — R4702 Dysphasia: Secondary | ICD-10-CM | POA: Diagnosis present

## 2016-07-20 DIAGNOSIS — Z803 Family history of malignant neoplasm of breast: Secondary | ICD-10-CM | POA: Diagnosis not present

## 2016-07-20 DIAGNOSIS — I270 Primary pulmonary hypertension: Secondary | ICD-10-CM | POA: Diagnosis present

## 2016-07-20 DIAGNOSIS — Z91048 Other nonmedicinal substance allergy status: Secondary | ICD-10-CM | POA: Diagnosis not present

## 2016-07-20 DIAGNOSIS — N179 Acute kidney failure, unspecified: Secondary | ICD-10-CM | POA: Diagnosis present

## 2016-07-20 DIAGNOSIS — I63312 Cerebral infarction due to thrombosis of left middle cerebral artery: Secondary | ICD-10-CM | POA: Diagnosis not present

## 2016-07-20 DIAGNOSIS — G451 Carotid artery syndrome (hemispheric): Secondary | ICD-10-CM | POA: Diagnosis not present

## 2016-07-20 DIAGNOSIS — Z7901 Long term (current) use of anticoagulants: Secondary | ICD-10-CM | POA: Diagnosis not present

## 2016-07-20 DIAGNOSIS — I482 Chronic atrial fibrillation: Secondary | ICD-10-CM | POA: Diagnosis not present

## 2016-07-20 LAB — COMPREHENSIVE METABOLIC PANEL
ALT: 12 U/L — ABNORMAL LOW (ref 14–54)
ANION GAP: 9 (ref 5–15)
AST: 19 U/L (ref 15–41)
Albumin: 4.3 g/dL (ref 3.5–5.0)
Alkaline Phosphatase: 83 U/L (ref 38–126)
BUN: 24 mg/dL — ABNORMAL HIGH (ref 6–20)
CHLORIDE: 102 mmol/L (ref 101–111)
CO2: 26 mmol/L (ref 22–32)
Calcium: 9.4 mg/dL (ref 8.9–10.3)
Creatinine, Ser: 1.11 mg/dL — ABNORMAL HIGH (ref 0.44–1.00)
GFR, EST AFRICAN AMERICAN: 49 mL/min — AB (ref >60)
GFR, EST NON AFRICAN AMERICAN: 42 mL/min — AB (ref >60)
Glucose, Bld: 120 mg/dL — ABNORMAL HIGH (ref 65–99)
POTASSIUM: 4.3 mmol/L (ref 3.5–5.1)
SODIUM: 137 mmol/L (ref 135–145)
Total Bilirubin: 0.7 mg/dL (ref 0.3–1.2)
Total Protein: 7.5 g/dL (ref 6.5–8.1)

## 2016-07-20 LAB — CBG MONITORING, ED: GLUCOSE-CAPILLARY: 129 mg/dL — AB (ref 65–99)

## 2016-07-20 LAB — CBC
HEMATOCRIT: 39.1 % (ref 36.0–46.0)
Hemoglobin: 13.4 g/dL (ref 12.0–15.0)
MCH: 33.1 pg (ref 26.0–34.0)
MCHC: 34.3 g/dL (ref 30.0–36.0)
MCV: 96.5 fL (ref 78.0–100.0)
PLATELETS: 348 10*3/uL (ref 150–400)
RBC: 4.05 MIL/uL (ref 3.87–5.11)
RDW: 13.5 % (ref 11.5–15.5)
WBC: 8.7 10*3/uL (ref 4.0–10.5)

## 2016-07-20 LAB — APTT: APTT: 33 s (ref 24–36)

## 2016-07-20 LAB — I-STAT CHEM 8, ED
BUN: 28 mg/dL — ABNORMAL HIGH (ref 6–20)
CHLORIDE: 100 mmol/L — AB (ref 101–111)
Calcium, Ion: 1.12 mmol/L — ABNORMAL LOW (ref 1.15–1.40)
Creatinine, Ser: 1.2 mg/dL — ABNORMAL HIGH (ref 0.44–1.00)
GLUCOSE: 119 mg/dL — AB (ref 65–99)
HEMATOCRIT: 41 % (ref 36.0–46.0)
HEMOGLOBIN: 13.9 g/dL (ref 12.0–15.0)
POTASSIUM: 4.3 mmol/L (ref 3.5–5.1)
SODIUM: 137 mmol/L (ref 135–145)
TCO2: 25 mmol/L (ref 0–100)

## 2016-07-20 LAB — URINALYSIS, COMPLETE (UACMP) WITH MICROSCOPIC
BACTERIA UA: NONE SEEN
BILIRUBIN URINE: NEGATIVE
Glucose, UA: NEGATIVE mg/dL
Hgb urine dipstick: NEGATIVE
KETONES UR: NEGATIVE mg/dL
NITRITE: NEGATIVE
PH: 7 (ref 5.0–8.0)
Protein, ur: 30 mg/dL — AB
Specific Gravity, Urine: 1.023 (ref 1.005–1.030)

## 2016-07-20 LAB — DIFFERENTIAL
BASOS PCT: 1 %
Basophils Absolute: 0.1 10*3/uL (ref 0.0–0.1)
EOS ABS: 0.3 10*3/uL (ref 0.0–0.7)
Eosinophils Relative: 3 %
Lymphocytes Relative: 28 %
Lymphs Abs: 2.4 10*3/uL (ref 0.7–4.0)
MONO ABS: 0.9 10*3/uL (ref 0.1–1.0)
Monocytes Relative: 10 %
Neutro Abs: 5.1 10*3/uL (ref 1.7–7.7)
Neutrophils Relative %: 58 %

## 2016-07-20 LAB — PROTIME-INR
INR: 1.11
PROTHROMBIN TIME: 14.4 s (ref 11.4–15.2)

## 2016-07-20 LAB — I-STAT TROPONIN, ED: TROPONIN I, POC: 0.02 ng/mL (ref 0.00–0.08)

## 2016-07-20 MED ORDER — SODIUM CHLORIDE 0.9 % IV SOLN
INTRAVENOUS | Status: AC
  Administered 2016-07-21: 02:00:00 via INTRAVENOUS

## 2016-07-20 MED ORDER — ACETAMINOPHEN 160 MG/5ML PO SOLN: 650.0000 mg | ORAL | Status: DC | PRN

## 2016-07-20 MED ORDER — ALTEPLASE (STROKE) FULL DOSE INFUSION
0.9000 mg/kg | Freq: Once | INTRAVENOUS | Status: AC
  Administered 2016-07-20: 67 mg via INTRAVENOUS

## 2016-07-20 MED ORDER — IOPAMIDOL (ISOVUE-370) INJECTION 76%
50.0000 mL | Freq: Once | INTRAVENOUS | Status: AC | PRN
  Administered 2016-07-20: 50 mL via INTRAVENOUS

## 2016-07-20 MED ORDER — SENNOSIDES-DOCUSATE SODIUM 8.6-50 MG PO TABS: 1.0000 | ORAL_TABLET | Freq: Every evening | ORAL | Status: DC | PRN

## 2016-07-20 MED ORDER — ACETAMINOPHEN 325 MG PO TABS
650.0000 mg | ORAL_TABLET | ORAL | Status: DC | PRN
  Filled 2016-07-20: qty 2

## 2016-07-20 MED ORDER — ACETAMINOPHEN 650 MG RE SUPP: 650.0000 mg | RECTAL | Status: DC | PRN

## 2016-07-20 MED ORDER — PANTOPRAZOLE SODIUM 40 MG IV SOLR
40.0000 mg | Freq: Every day | INTRAVENOUS | Status: DC
  Administered 2016-07-21: 40 mg via INTRAVENOUS
  Filled 2016-07-20 (×2): qty 40

## 2016-07-20 MED ORDER — SODIUM CHLORIDE 0.9 % IV SOLN
50.0000 mL | Freq: Once | INTRAVENOUS | Status: AC
  Administered 2016-07-20: 50 mL via INTRAVENOUS

## 2016-07-20 MED ORDER — STROKE: EARLY STAGES OF RECOVERY BOOK
Freq: Once | Status: AC
  Administered 2016-07-21: 03:00:00
  Filled 2016-07-20 (×2): qty 1

## 2016-07-20 NOTE — ED Notes (Signed)
Informed [place], Neuro MD of bleeding/hematoma to bilat ACs

## 2016-07-20 NOTE — ED Provider Notes (Signed)
[facility] DEPT Provider Note   CSN: [id number] Arrival date & time: 07/20/16  1832     History   Chief Complaint Chief Complaint  Patient presents with  . Code Stroke    HPI Tammy Lloyd is a 83 y.o. female.   Neurologic Problem  This is a new problem. The current episode started 1 to 2 hours ago. The problem occurs constantly. The problem has not changed since onset.Pertinent negatives include no chest pain and no headaches. Nothing aggravates the symptoms. Nothing relieves the symptoms. She has tried nothing for the symptoms. The treatment provided no relief.    Past Medical History:  Diagnosis Date  . Arthritis   . Cancer ([place])    Breast  . Cataract   . Colon polyp   . Edema of both legs   . Hemochromatosis    Followed by  hematology;  . Hemorrhoid   . Hypertension   . Idiopathic hemochromatosis ([place]) 09/28/2011  . Lichen sclerosus   . Osteoporosis 2005   DEXA 2015 T score -1.7 stable from prior DEXA 2011 history of Fosamax 8 years. On drug-free holiday now  . Pulmonary hypertension   . Stroke [facility])     Patient Active Problem List   Diagnosis Date Noted  . Stroke (cerebrum) ([place]) 07/20/2016  . Abnormality of gait 10/06/2013  . Acute CVA (cerebrovascular accident) ([place]) 10/05/2013  . Numbness of left hand 10/05/2013  . Meningioma ([place]) 10/05/2013  . Atrial fibrillation ([place]) 10/05/2013  . Acute coronary syndrome ([place]) 10/23/2011  . Idiopathic hemochromatosis ([place]) 09/28/2011  . GERD (gastroesophageal reflux disease) 09/06/2011  . Hypertension 12/19/2010  . Arthritis 12/19/2010  . History of breast cancer 12/19/2010    Past Surgical History:  Procedure Laterality Date  . BACK SURGERY  2008  . BREAST LUMPECTOMY     Right  . CATARACT EXTRACTION, BILATERAL    . COLONOSCOPY  2009  . DILATION AND CURETTAGE OF UTERUS  2005  . HYSTEROSCOPY  08/2003  . LEFT HEART CATHETERIZATION WITH CORONARY ANGIOGRAM N/A 10/24/2011   Procedure: LEFT HEART  CATHETERIZATION WITH CORONARY ANGIOGRAM;  Surgeon: Clent Demark, MD;  Location: [place] CATH LAB;  Service: Cardiovascular;  Laterality: N/A;  . TOTAL KNEE ARTHROPLASTY  2006    OB History    Gravida Para Term Preterm AB Living   1 1       1    SAB TAB Ectopic Multiple Live Births                   Home Medications    Prior to Admission medications   Medication Sig Start Date End Date Taking? Authorizing Provider  acetaminophen (TYLENOL) 325 MG tablet Take 650 mg by mouth every 6 (six) hours as needed for headache (or pain). Reported on 10/14/2015   Yes Historical Provider, MD  amLODipine (NORVASC) 10 MG tablet Take 1 tablet by mouth  daily 01/19/16  Yes Denita Lung, MD  aspirin 81 MG tablet Take 81 mg by mouth daily.   Yes Historical Provider, MD  clobetasol cream (TEMOVATE) [id number] % Apply 1 application topically 2 (two) times daily as needed (for itching). Reported on 10/14/2015   Yes Historical Provider, MD  losartan-hydrochlorothiazide (HYZAAR) 100-25 MG tablet Take 1 tablet by mouth daily. 04/19/16  Yes Historical Provider, MD  Multiple Vitamins-Minerals (PRESERVISION AREDS PO) Take 1 capsule by mouth 2 (two) times daily.    Yes Historical Provider, MD  rivaroxaban (XARELTO) 20 MG TABS tablet Take 20 mg  by mouth every morning.   Yes Historical Provider, MD  UNKNOWN TO PATIENT    Yes Historical Provider, MD  losartan (COZAAR) 100 MG tablet Take 1 tablet (100 mg total) by mouth daily. Patient not taking: Reported on 07/20/2016 05/25/16   Rita Ohara, MD  mupirocin cream (BACTROBAN) 2 % Apply 1 application topically 2 (two) times daily. Use to affected areas of skin (scabs/scratched areas) to prevent infection Patient not taking: Reported on 07/20/2016 05/25/16   Rita Ohara, MD  Prednicarbate 0.1 % CREA Apply 1 application topically 2 (two) times daily as needed. For itching Patient not taking: Reported on 07/20/2016 05/05/16   Anastasio Auerbach, MD    Family History Family History  Problem  Relation Age of Onset  . Cancer Mother 61    GYN cancer (had hysterectomy)  . Heart disease Mother     CHF  . Cancer Father     liver  . Asthma Sister   . Breast cancer Sister 41  . Heart disease Brother     rheumatic heart attack  . Breast cancer Sister 18  . Asthma Brother   . Cancer Brother     Prostate  . Cancer Brother     Prostate    Social History Social History  Substance Use Topics  . Smoking status: Never Smoker  . Smokeless tobacco: Never Used  . Alcohol use No     Allergies   Tape   Review of Systems Review of Systems  Cardiovascular: Negative for chest pain.  Neurological: Positive for facial asymmetry and speech difficulty. Negative for headaches.  All other systems reviewed and are negative.    Physical Exam Updated Vital Signs BP 160/58   Pulse 76   Temp 98.5 F (36.9 C)   Resp 19   Ht 5\' 9"  (1.753 m)   Wt 165 lb (74.8 kg)   SpO2 96%   BMI 24.37 kg/m   Physical Exam  Constitutional: She appears well-developed and well-nourished.  HENT:  Head: Normocephalic and atraumatic.  Eyes: Conjunctivae and EOM are normal.  Neck: Normal range of motion.  Cardiovascular: Normal rate and regular rhythm.   Pulmonary/Chest: Effort normal. No stridor. No respiratory distress. She has no wheezes.  Abdominal: She exhibits no distension.  Musculoskeletal:  No altered mental status, able to give full seemingly accurate history.  Face is assymmetric with slight R facial droop which is apparently chronic, EOM's intact, pupils equal and reactive, vision intact, tongue and uvula midline without deviation Upper and Lower extremity motor 5/5, intact pain perception in distal extremities, 2+ reflexes in biceps, patella and achilles tendons.   Neurological: She is alert.  Skin: Skin is warm and dry.  Nursing note and vitals reviewed.    ED Treatments / Results  Labs (all labs ordered are listed, but only abnormal results are displayed) Labs Reviewed    COMPREHENSIVE METABOLIC PANEL - Abnormal; Notable for the following:       Result Value   Glucose, Bld 120 (*)    BUN 24 (*)    Creatinine, Ser 1.11 (*)    ALT 12 (*)    GFR calc non Af Amer 42 (*)    GFR calc Af Amer 49 (*)    All other components within normal limits  URINALYSIS, COMPLETE (UACMP) WITH MICROSCOPIC - Abnormal; Notable for the following:    Protein, ur 30 (*)    Leukocytes, UA MODERATE (*)    Squamous Epithelial / LPF 0-5 (*)  All other components within normal limits  CBG MONITORING, ED - Abnormal; Notable for the following:    Glucose-Capillary 129 (*)    All other components within normal limits  I-STAT CHEM 8, ED - Abnormal; Notable for the following:    Chloride 100 (*)    BUN 28 (*)    Creatinine, Ser 1.20 (*)    Glucose, Bld 119 (*)    Calcium, Ion 1.12 (*)    All other components within normal limits  PROTIME-INR  APTT  CBC  DIFFERENTIAL  HEMOGLOBIN A1C  LIPID PANEL  I-STAT TROPOININ, ED  CBG MONITORING, ED    EKG  EKG Interpretation None       Radiology Ct Angio Head W Or Wo Contrast  Result Date: 07/20/2016 CLINICAL DATA:  83 y/o F; Sudden onset trouble speaking. History of TIA with similar symptoms. EXAM: CT ANGIOGRAPHY HEAD AND NECK TECHNIQUE: Multidetector CT imaging of the head and neck was performed using the standard protocol during bolus administration of intravenous contrast. Multiplanar CT image reconstructions and MIPs were obtained to evaluate the vascular anatomy. Carotid stenosis measurements (when applicable) are obtained utilizing NASCET criteria, using the distal internal carotid diameter as the denominator. CONTRAST:  50 cc Isovue 370 COMPARISON:  07/20/2016 CT head.  10/06/2013 MRI head. FINDINGS: CTA NECK FINDINGS Aortic arch: Moderate aortic atherosclerosis with calcification. Three vessel arch. No significant stenosis of great vessel origins. Right carotid system: No evidence of dissection, stenosis (50% or greater) or  occlusion. Moderate calcified plaque of the bifurcation without significant stenosis. Left carotid system: No evidence of dissection, stenosis (50% or greater) or occlusion. Mild calcified plaque of the bifurcation without significant stenosis. The left common carotid artery origin is not included within the field of view. Vertebral arteries: Calcified plaque of right vertebral artery origin with moderate 60% stenosis. Right dominant vertebrobasilar system. Otherwise no evidence for significant stenosis, occlusion, aneurysm, or dissection. Skeleton: Moderate cervical spondylosis with both discogenic and facet arthropathy. No high-grade bony canal stenosis. Multiple levels of mild to moderate bony foraminal narrowing greater on the right. No acute osseous abnormality. Other neck: Negative. Upper chest: Negative. Review of the MIP images confirms the above findings CTA HEAD FINDINGS Anterior circulation: No significant stenosis, proximal occlusion, aneurysm, or vascular malformation. Calcific atherosclerosis of cavernous and paraclinoid internal carotid arteries without significant stenosis. Posterior circulation: No significant stenosis, proximal occlusion, aneurysm, or vascular malformation. Mild basilar irregularity without stenosis likely related to atherosclerosis. Venous sinuses: As permitted by contrast timing, patent. Anatomic variants: No posterior communicating artery identified, likely hypoplastic or absent. Small anterior communicating artery. Left posterior fossa extra-axial mass compatible with meningioma as seen on prior studies is stable. Review of the MIP images confirms the above findings IMPRESSION: 1. Right vertebral artery origin calcified plaque with moderate 60% stenosis. 2. Otherwise no evidence of dissection, significant stenosis, or dissection of the carotid and vertebral arteries of the neck. 3. No significant stenosis, proximal occlusion, aneurysm, or vascular malformation of the circle of  Willis. These results were called by telephone at the time of interpretation on 07/20/2016 at 8:08 pm to Dr. Cheral Marker, who verbally acknowledged these results. Electronically Signed   By: Kristine Garbe M.D.   On: 07/20/2016 20:20   Ct Angio Neck W Or Wo Contrast  Result Date: 07/20/2016 CLINICAL DATA:  83 y/o F; Sudden onset trouble speaking. History of TIA with similar symptoms. EXAM: CT ANGIOGRAPHY HEAD AND NECK TECHNIQUE: Multidetector CT imaging of the head and neck was performed  using the standard protocol during bolus administration of intravenous contrast. Multiplanar CT image reconstructions and MIPs were obtained to evaluate the vascular anatomy. Carotid stenosis measurements (when applicable) are obtained utilizing NASCET criteria, using the distal internal carotid diameter as the denominator. CONTRAST:  50 cc Isovue 370 COMPARISON:  07/20/2016 CT head.  10/06/2013 MRI head. FINDINGS: CTA NECK FINDINGS Aortic arch: Moderate aortic atherosclerosis with calcification. Three vessel arch. No significant stenosis of great vessel origins. Right carotid system: No evidence of dissection, stenosis (50% or greater) or occlusion. Moderate calcified plaque of the bifurcation without significant stenosis. Left carotid system: No evidence of dissection, stenosis (50% or greater) or occlusion. Mild calcified plaque of the bifurcation without significant stenosis. The left common carotid artery origin is not included within the field of view. Vertebral arteries: Calcified plaque of right vertebral artery origin with moderate 60% stenosis. Right dominant vertebrobasilar system. Otherwise no evidence for significant stenosis, occlusion, aneurysm, or dissection. Skeleton: Moderate cervical spondylosis with both discogenic and facet arthropathy. No high-grade bony canal stenosis. Multiple levels of mild to moderate bony foraminal narrowing greater on the right. No acute osseous abnormality. Other neck: Negative.  Upper chest: Negative. Review of the MIP images confirms the above findings CTA HEAD FINDINGS Anterior circulation: No significant stenosis, proximal occlusion, aneurysm, or vascular malformation. Calcific atherosclerosis of cavernous and paraclinoid internal carotid arteries without significant stenosis. Posterior circulation: No significant stenosis, proximal occlusion, aneurysm, or vascular malformation. Mild basilar irregularity without stenosis likely related to atherosclerosis. Venous sinuses: As permitted by contrast timing, patent. Anatomic variants: No posterior communicating artery identified, likely hypoplastic or absent. Small anterior communicating artery. Left posterior fossa extra-axial mass compatible with meningioma as seen on prior studies is stable. Review of the MIP images confirms the above findings IMPRESSION: 1. Right vertebral artery origin calcified plaque with moderate 60% stenosis. 2. Otherwise no evidence of dissection, significant stenosis, or dissection of the carotid and vertebral arteries of the neck. 3. No significant stenosis, proximal occlusion, aneurysm, or vascular malformation of the circle of Willis. These results were called by telephone at the time of interpretation on 07/20/2016 at 8:08 pm to Dr. Cheral Marker, who verbally acknowledged these results. Electronically Signed   By: Kristine Garbe M.D.   On: 07/20/2016 20:20   Ct Head Code Stroke W/o Cm  Addendum Date: 07/20/2016   ADDENDUM REPORT: 07/20/2016 19:36 ADDENDUM: These results were called by telephone at the time of interpretation on 07/20/2016 at 7:36 pm to Dr. Cheral Marker, who verbally acknowledged these results. Electronically Signed   By: Kristine Garbe M.D.   On: 07/20/2016 19:36   Result Date: 07/20/2016 CLINICAL DATA:  Code stroke. Sudden onset trouble speaking. History of TIA. EXAM: CT HEAD WITHOUT CONTRAST TECHNIQUE: Contiguous axial images were obtained from the base of the skull through the  vertex without intravenous contrast. COMPARISON:  10/06/2013 MRI brain.  10/05/2013 CT head. FINDINGS: Brain: No evidence of acute infarction, hemorrhage, hydrocephalus, extra-axial collection or mass lesion/mass effect of the brain. Chronic left thalamus lacunar infarct. New lucency in right thalamus. Mild chronic microvascular ischemic changes and parenchymal volume loss of the brain. Set Extra-axial calcified mass lateral to the left cerebellar hemisphere is stable measuring 26 x 18 mm axially (series 2, image 11). This likely represents a meningioma. There is minimal left-to-right midline shift of the posterior fossa due to mass effect from the extra-axial lesion. Vascular: No hyperdense vessel. Moderate calcific atherosclerosis of cavernous internal carotid arteries. Skull: Normal. Negative for fracture or focal lesion.  Sinuses/Orbits: Mucous retention cysts within the bilateral frontal sinus antrum. Right maxillary opacification with a fluid level. Other: None. ASPECTS [facility] Stroke Program Early CT Score) - Ganglionic level infarction (caudate, lentiform nuclei, internal capsule, insula, M1-M3 cortex): 7 - Supraganglionic infarction (M4-M6 cortex): 3 Total score (0-10 with 10 being normal): 10 IMPRESSION: 1. No evidence of large territory acute infarct, hemorrhage, or new focal mass effect. 2. New lucency in right thalamus is probably an interval chronic lacunar infarct. 3. ASPECTS is 10 4. Stable calcified dural-based mass in the left posterior fossa probably representing meningioma. Electronically Signed: By: Kristine Garbe M.D. On: 07/20/2016 19:17    Procedures Procedures (including critical care time)  CRITICAL CARE Performed by: Merrily Pew Total critical care time: 30 minutes Critical care time was exclusive of separately billable procedures and treating other patients. Critical care was necessary to treat or prevent imminent or life-threatening deterioration. Critical care was  time spent personally by me on the following activities: development of treatment plan with patient and/or surrogate as well as nursing, discussions with consultants, evaluation of patient's response to treatment, examination of patient, obtaining history from patient or surrogate, ordering and performing treatments and interventions, ordering and review of laboratory studies, ordering and review of radiographic studies, pulse oximetry and re-evaluation of patient's condition.   Medications Ordered in ED Medications   stroke: mapping our early stages of recovery book (not administered)  0.9 %  sodium chloride infusion (not administered)  acetaminophen (TYLENOL) tablet 650 mg (not administered)    Or  acetaminophen (TYLENOL) solution 650 mg (not administered)    Or  acetaminophen (TYLENOL) suppository 650 mg (not administered)  senna-docusate (Senokot-S) tablet 1 tablet (not administered)  pantoprazole (PROTONIX) injection 40 mg (not administered)  iopamidol (ISOVUE-370) 76 % injection 50 mL (50 mLs Intravenous Contrast Given 07/20/16 1948)  alteplase (ACTIVASE) 1 mg/mL infusion 67 mg (0 mg/kg  74.8 kg Intravenous Stopped 07/20/16 2054)    Followed by  0.9 %  sodium chloride infusion (0 mLs Intravenous Stopped 07/20/16 2313)     Initial Impression / Assessment and Plan / ED Course  I have reviewed the triage vital signs and the nursing notes.  Pertinent labs & imaging results that were available during my care of the patient were reviewed by me and considered in my medical decision making (see chart for details).  Clinical Course     Code stroke 2/2 aphasia and right facial droop. Ct withotu bleed. Neurology decided to give tPA. Continued monitoring and multiple rechecks with some bruising to left arm but no headache, worsening symptoms or other concern for significant bleeding.   Final Clinical Impressions(s) / ED Diagnoses   Final diagnoses:  Stroke [facility])    New Prescriptions New  Prescriptions   No medications on file     Merrily Pew, MD 07/20/16 2353

## 2016-07-20 NOTE — ED Notes (Addendum)
tPA started at 2005; 2 RN and pharmD at bedside

## 2016-07-20 NOTE — ED Triage Notes (Signed)
Per Pt family, Pt was cooking dinner when she had a sudden onset of trouble speaking her words. Hx of TIA in the past with the same symptoms.

## 2016-07-20 NOTE — ED Notes (Signed)
Bolus dose administered and complete

## 2016-07-20 NOTE — Consult Note (Addendum)
NEURO HOSPITALIST CONSULT NOTE   Requestig physician: Dr. Dayna Barker  Reason for Consult: Acute onset of dysphasia  History obtained from:  Patient and Family    HPI:                                                                                                                                          Tammy Lloyd is an 83 y.o. female with a prior history of TIA who presents with acute onset of dysphasia. She was at home cooking dinner when she had sudden onset of "trouble getting her words out". Her prior TIA had the same symptoms. She has a history of atrial fibrillation. When asked, she denies taking ASA, Plavix, Aggrenox or a blood thinner (specifically stated no to dabigatran, Eliquis, Xarelto and Coumadin/warfarin). Given her aphasia, family was unsure if her responses are reliable and were asked to locate her medications list to ensure that she is not on an anticoagulant in order to make decision to consent or not consent to tPA. The patient is unable to make complex medical decisions in the context of her aphasia. Daughter arrived to ED and verified that patient was not taking an anticoagulant; previously was on a DOAC but stopped taking on her own as she thought it might be causing side effect of itching. LKN 5:50 PM. NIHSS =  2.  Past Medical History:  Diagnosis Date  . Arthritis   . Cancer ([place])    Breast  . Cataract   . Colon polyp   . Edema of both legs   . Hemochromatosis    Followed by  hematology;  . Hemorrhoid   . Hypertension   . Idiopathic hemochromatosis ([place]) 09/28/2011  . Lichen sclerosus   . Osteoporosis 2005   DEXA 2015 T score -1.7 stable from prior DEXA 2011 history of Fosamax 8 years. On drug-free holiday now  . Pulmonary hypertension   . Stroke [facility])     Past Surgical History:  Procedure Laterality Date  . BACK SURGERY  2008  . BREAST LUMPECTOMY     Right  . CATARACT EXTRACTION, BILATERAL    . COLONOSCOPY  2009  . DILATION AND  CURETTAGE OF UTERUS  2005  . HYSTEROSCOPY  08/2003  . LEFT HEART CATHETERIZATION WITH CORONARY ANGIOGRAM N/A 10/24/2011   Procedure: LEFT HEART CATHETERIZATION WITH CORONARY ANGIOGRAM;  Surgeon: Clent Demark, MD;  Location: [facility] CATH LAB;  Service: Cardiovascular;  Laterality: N/A;  . TOTAL KNEE ARTHROPLASTY  2006    Family History  Problem Relation Age of Onset  . Cancer Mother 29    GYN cancer (had hysterectomy)  . Heart disease Mother     CHF  . Cancer Father     liver  . Asthma Sister   .  Breast cancer Sister 81  . Heart disease Brother     rheumatic heart attack  . Breast cancer Sister 2  . Asthma Brother   . Cancer Brother     Prostate  . Cancer Brother     Prostate   Social History:  reports that she has never smoked. She has never used smokeless tobacco. She reports that she does not drink alcohol or use drugs.  No Known Allergies  MEDICATIONS:                                                                                                                     Outpatient medications listed in EPIC: acetaminophen (TYLENOL) 325 MG tablet Take 650 mg by mouth every 6 (six) hours as needed. Reported on 10/14/2015 Historical Provider, MD Needs Review  amLODipine (NORVASC) 10 MG tablet Take 1 tablet by mouth daily Denita Lung, MD Needs Review  aspirin 81 MG tablet Take 81 mg by mouth daily. Historical Provider, MD Needs Review  Cholecalciferol (VITAMIN D) 1000 UNITS capsule Take 1,000 Units by mouth daily. Reported on 10/14/2015 Historical Provider, MD Needs Review  clobetasol cream (TEMOVATE) [id number] % Apply 1 application topically 2 (two) times daily. Reported on 10/14/2015 Historical Provider, MD Needs Review  losartan (COZAAR) 100 MG tablet Take 1 tablet (100 mg total) by mouth daily. Rita Ohara, MD Needs Review   Patient not taking: Reported on 07/14/2016    Multiple Vitamins-Minerals (PRESERVISION AREDS PO) Take 2 capsules by mouth daily. Historical Provider, MD Needs Review   mupirocin cream (BACTROBAN) 2 % Apply 1 application topically 2 (two) times daily. Use to affected areas of skin (scabs/scratched areas) to prevent infection Rita Ohara, MD Needs Review   Patient not taking: Reported on 07/14/2016    Prednicarbate 0.1 % CREA Apply 1 application topically 2 (two) times daily as needed. For itching Anastasio Auerbach, MD Needs Review      ROS:                                                                                                                                       History obtained from patient. Positive for chills. Other ROS as per HPI.   Blood pressure 175/75, pulse 74, temperature 98.7 F (37.1 C), temperature source Oral, resp. rate 18, height 5\' 9"  (1.753 m), weight 74.8 kg (165 lb), SpO2 99 %.  General Examination:  HEENT-  Normocephalic/atraumatic.  Lungs- No gross wheezing. Respirations unlabored.  Extremities- Mild pretibial edema bilaterally.   Neurological Examination Mental Status: Alert, oriented to self, day, circumstance, location. Able to follow simple motor commands. Some difficulty with naming and repetition. Some phonemic paraphasias and mild alexia noted when reading sentences. When asked to describe recent events, speech becomes agrammatic with word finding difficulty and frequent pauses - patient appears frustrated with the deficit. Mild dysarthria.  Cranial Nerves: II: Visual fields intact to testing of all 4 quadrants in each eye. PERRL.  III,IV, VI: ptosis not present, EOMI without nystagmus V,VII: Mild perioral asymmetry which family states is her baseline. Eyelid closure normal bilaterally. Facial temp sensation equal bilaterally.  VIII: HOH IX,X: No hoarseness noted.  XI: symmetric XII: Tongue deviates slightly to left.  Motor:  Right : Upper extremity   4+/5    Left:     Upper extremity   4+5  Lower extremity    4+/5    Lower extremity   4+/5 Sensory: Temperature and light touch intact proximal limbs x 4 without extinction Deep Tendon Reflexes: 2+ and symmetric throughout Plantars: Equivocal bilaterally.  Cerebellar: No ataxia with FNF bilaterally.  Gait: Deferred.   Lab Results: Basic Metabolic Panel:  Recent Labs Lab 07/20/16 1852  NA 137  K 4.3  CL 100*  GLUCOSE 119*  BUN 28*  CREATININE 1.20*    Liver Function Tests: No results for input(s): AST, ALT, ALKPHOS, BILITOT, PROT, ALBUMIN in the last 168 hours. No results for input(s): LIPASE, AMYLASE in the last 168 hours. No results for input(s): AMMONIA in the last 168 hours.  CBC:  Recent Labs Lab 07/20/16 1845 07/20/16 1852  WBC 8.7  --   NEUTROABS 5.1  --   HGB 13.4 13.9  HCT 39.1 41.0  MCV 96.5  --   PLT 348  --     Cardiac Enzymes: No results for input(s): CKTOTAL, CKMB, CKMBINDEX, TROPONINI in the last 168 hours.  Lipid Panel: No results for input(s): CHOL, TRIG, HDL, CHOLHDL, VLDL, LDLCALC in the last 168 hours.  CBG:  Recent Labs Lab 07/20/16 1837  GLUCAP 129*    Microbiology: Results for orders placed or performed in visit on 05/07/14  Urine culture     Status: None   Collection Time: 05/07/14 12:13 PM  Result Value Ref Range Status   Colony Count NO GROWTH  Final   Organism ID, Bacteria NO GROWTH  Final    Coagulation Studies:  Recent Labs  07/20/16 1845  LABPROT 14.4  INR 1.11    Imaging: Ct Head Code Stroke W/o Cm  Result Date: 07/20/2016 CLINICAL DATA:  Code stroke. Sudden onset trouble speaking. History of TIA. EXAM: CT HEAD WITHOUT CONTRAST TECHNIQUE: Contiguous axial images were obtained from the base of the skull through the vertex without intravenous contrast. COMPARISON:  10/06/2013 MRI brain.  10/05/2013 CT head. FINDINGS: Brain: No evidence of acute infarction, hemorrhage, hydrocephalus, extra-axial collection or mass lesion/mass effect of the brain. Chronic left thalamus  lacunar infarct. New lucency in right thalamus. Mild chronic microvascular ischemic changes and parenchymal volume loss of the brain. Set Extra-axial calcified mass lateral to the left cerebellar hemisphere is stable measuring 26 x 18 mm axially (series 2, image 11). This likely represents a meningioma. There is minimal left-to-right midline shift of the posterior fossa due to mass effect from the extra-axial lesion. Vascular: No hyperdense vessel. Moderate calcific atherosclerosis of cavernous internal carotid arteries. Skull: Normal. Negative for fracture  or focal lesion. Sinuses/Orbits: Mucous retention cysts within the bilateral frontal sinus antrum. Right maxillary opacification with a fluid level. Other: None. ASPECTS [facility] Stroke Program Early CT Score) - Ganglionic level infarction (caudate, lentiform nuclei, internal capsule, insula, M1-M3 cortex): 7 - Supraganglionic infarction (M4-M6 cortex): 3 Total score (0-10 with 10 being normal): 10 IMPRESSION: 1. No evidence of large territory acute infarct, hemorrhage, or new focal mass effect. 2. New lucency in right thalamus is probably an interval chronic lacunar infarct. 3. ASPECTS is 10 4. Stable calcified dural-based mass in the left posterior fossa probably representing meningioma. Electronically Signed   By: Kristine Garbe M.D.   On: 07/20/2016 19:17    Assessment: Acute onset of aphasia. Most likely secondary to cardioembolic stroke in the setting of atrial fibrillation.  1. CT reveals no evidence of large territory subacute infarct, hemorrhage, or new focal mass effect. A new lucency in right thalamus is probably an interval chronic lacunar infarct. ASPECTS is 10. 2. CTA shows no LVO. Atherosclerotic changes are noted.  3. After comprehensive review of possible contraindications, she has no absolute contraindications to tPA administration. 4. Atrial fibrillation. Noncompliant with DOAC.  5. Stable calcified dural-based mass in the  left posterior fossa probably representing meningioma. 6. History of breast cancer.   Recommendations:  1. The patient is a tPA candidate. Discussed extensively the risks/benefits of tPA treatment vs. no treatment with her family, including risks of hemorrhage and death with tPA administration versus worse overall outcomes on average in patients within tPA time window who are not administered tPA. The patient's aphasia precludes meaningful medical decision making on her part at this time. Overall benefits of tPA regarding long-term prognosis are felt to outweigh risks. The patient's daughter expressed understanding and wish to proceed with tPA.  2. Admit to ICU following tPA. Orders to include frequent neuro checks and BP management.  3. MRI brain. 4. TTE.  5. Carotid ultrasound to further evaluate carotid bulb atherosclerosis seen on CTA.  6. Repeat CT head 24 hours following tPA.  7. No antiplatelet medications or anticoagulants for at least 24 hours following tPA. Consider starting an anticoagulant for her a-fib after 24 hours if her follow up CT is negative for hemorrhagic conversion.   8. DVT prophylaxis with SCDs.  9. PT/OT/Speech.  77. Given advanced age, statin risks most likely outweigh potential benefits.   Electronically signed: Dr. Kerney Elbe 07/20/2016, 7:37 PM

## 2016-07-20 NOTE — ED Notes (Signed)
Neuro MD Lindzen at bedside discussing risks/benefits of tPA with family and obtaining consent

## 2016-07-20 NOTE — ED Notes (Signed)
Neuro at bedside, repeat NIHSS done, pt to CT with [place]

## 2016-07-21 ENCOUNTER — Inpatient Hospital Stay (HOSPITAL_COMMUNITY): Payer: Medicare Other

## 2016-07-21 ENCOUNTER — Inpatient Hospital Stay (HOSPITAL_COMMUNITY): Payer: PRIVATE HEALTH INSURANCE

## 2016-07-21 DIAGNOSIS — I63312 Cerebral infarction due to thrombosis of left middle cerebral artery: Secondary | ICD-10-CM

## 2016-07-21 DIAGNOSIS — I6789 Other cerebrovascular disease: Secondary | ICD-10-CM

## 2016-07-21 LAB — LIPID PANEL
Cholesterol: 172 mg/dL (ref 0–200)
HDL: 46 mg/dL (ref >40)
LDL CALC: 114 mg/dL — AB (ref 0–99)
Total CHOL/HDL Ratio: 3.7 RATIO
Triglycerides: 58 mg/dL (ref <150)
VLDL: 12 mg/dL (ref 0–40)

## 2016-07-21 LAB — ECHOCARDIOGRAM COMPLETE
HEIGHTINCHES: 67 in
WEIGHTICAEL: 2423.3 [oz_av]

## 2016-07-21 LAB — MRSA PCR SCREENING: MRSA BY PCR: NEGATIVE

## 2016-07-21 MED ORDER — HYDROCHLOROTHIAZIDE 25 MG PO TABS
25.0000 mg | ORAL_TABLET | Freq: Every day | ORAL | Status: DC
  Administered 2016-07-21: 25 mg via ORAL
  Filled 2016-07-21 (×2): qty 1

## 2016-07-21 MED ORDER — LOSARTAN POTASSIUM-HCTZ 100-25 MG PO TABS: 1.0000 | ORAL_TABLET | Freq: Every day | ORAL | Status: DC

## 2016-07-21 MED ORDER — RIVAROXABAN 15 MG PO TABS
15.0000 mg | ORAL_TABLET | Freq: Every day | ORAL | Status: DC
  Administered 2016-07-21: 15 mg via ORAL
  Filled 2016-07-21: qty 1

## 2016-07-21 MED ORDER — PRAVASTATIN SODIUM 20 MG PO TABS
20.0000 mg | ORAL_TABLET | Freq: Every day | ORAL | Status: DC
  Administered 2016-07-21: 20 mg via ORAL
  Filled 2016-07-21: qty 1

## 2016-07-21 MED ORDER — RIVAROXABAN 15 MG PO TABS: 15.0000 mg | ORAL_TABLET | Freq: Every day | ORAL | Status: DC

## 2016-07-21 MED ORDER — AMLODIPINE BESYLATE 10 MG PO TABS
10.0000 mg | ORAL_TABLET | Freq: Every day | ORAL | Status: DC
  Administered 2016-07-21: 10 mg via ORAL
  Filled 2016-07-21 (×2): qty 1

## 2016-07-21 MED ORDER — LOSARTAN POTASSIUM 50 MG PO TABS
100.0000 mg | ORAL_TABLET | Freq: Every day | ORAL | Status: DC
  Administered 2016-07-21 – 2016-07-22 (×2): 100 mg via ORAL
  Filled 2016-07-21 (×2): qty 2

## 2016-07-21 MED ORDER — PERFLUTREN LIPID MICROSPHERE
INTRAVENOUS | Status: AC
  Administered 2016-07-21: 2 mL
  Filled 2016-07-21: qty 10

## 2016-07-21 NOTE — Evaluation (Signed)
Physical Therapy Evaluation Patient Details Name: Tammy Lloyd MRN: [id number] DOB: Jul 27, 1923 Today's Date: 07/21/2016   History of Present Illness  Patient is a 83 y/o female with hx of CVA, pulmonary HTN, cataracts, cancer presents with difficulty getting words out. Head CT-New lucency in right thalamus is probably an interval chronic lacunar infarct. s/p tPA.  Clinical Impression  Patient presents with generalized weakness due to immobility and impaired balance s/p above. Required use of RW for BUE support during gait training today however pt normally uses SPC. Pt Mod I PTA. Has a 73 y/o granddaughter who lives with her but is in and out. Encouraged mobility while in hospital. Anticipate pt's strength and mobility will improve with increased activity. Will plan for stair training tomorrow prior to d/c. Will follow acutely to maximize independence and mobility prior to return home.    Follow Up Recommendations Home health PT;Supervision for mobility/OOB    Equipment Recommendations  None recommended by PT    Recommendations for Other Services OT consult     Precautions / Restrictions Precautions Precautions: Fall Restrictions Weight Bearing Restrictions: No      Mobility  Bed Mobility Overal bed mobility: Needs Assistance Bed Mobility: Supine to Sit     Supine to sit: Supervision;HOB elevated     General bed mobility comments: No assist needed. Use of rail.  Transfers Overall transfer level: Needs assistance Equipment used: Straight cane Transfers: Sit to/from Stand Sit to Stand: Min guard         General transfer comment: Min guard to steady in standing due to first time being up. NO dizziness. Transferred to chair post ambulation.  Ambulation/Gait Ambulation/Gait assistance: Min guard Ambulation Distance (Feet): 120 Feet Assistive device: Rolling walker (2 wheeled) Gait Pattern/deviations: Step-through pattern;Decreased stride length;Trunk flexed Gait  velocity: decreased- age appropriate though   General Gait Details: Slow, mildly unsteady gait with cues for RW management. Ambulated some with SPC and some with RW. balance improved with use of RW for BUe support.  Stairs            Wheelchair Mobility    Modified Rankin (Stroke Patients Only) Modified Rankin (Stroke Patients Only) Pre-Morbid Rankin Score: Slight disability Modified Rankin: Moderately severe disability     Balance Overall balance assessment: Needs assistance Sitting-balance support: Feet supported;No upper extremity supported Sitting balance-Leahy Scale: Good Sitting balance - Comments: Able to start donning socks but requires some assist to finish   Standing balance support: During functional activity Standing balance-Leahy Scale: Fair                               Pertinent Vitals/Pain Pain Assessment: No/denies pain    Home Living Family/patient expects to be discharged to:: Private residence Living Arrangements: Other relatives (granddaughter) Available Help at Discharge: Family;Available PRN/intermittently Type of Home: House Home Access: Stairs to enter Entrance Stairs-Rails: Right Entrance Stairs-Number of Steps: 5 Home Layout: One level;Laundry or work area in basement (has a Scientist, research (life sciences)) Home Equipment: Environmental consultant - 2 wheels;Cane - single point;Tub bench      Prior Function Level of Independence: Independent with assistive device(s)         Comments: USes SPC for ambulation. Drives. does laundry, cooks, cleans. no falls reported in last year.     Hand Dominance        Extremity/Trunk Assessment   Upper Extremity Assessment Upper Extremity Assessment: Defer to OT evaluation    Lower Extremity  Assessment Lower Extremity Assessment: Overall WFL for tasks assessed    Cervical / Trunk Assessment Cervical / Trunk Assessment: Kyphotic  Communication   Communication: HOH  Cognition Arousal/Alertness:  Awake/alert Behavior During Therapy: WFL for tasks assessed/performed Overall Cognitive Status: Within Functional Limits for tasks assessed                      General Comments General comments (skin integrity, edema, etc.): Daughter present during session.    Exercises     Assessment/Plan    PT Assessment Patient needs continued PT services  PT Problem List Decreased mobility;Decreased strength;Decreased activity tolerance;Decreased balance          PT Treatment Interventions Therapeutic activities;Gait training;Therapeutic exercise;Patient/family education;Balance training;Functional mobility training;Stair training    PT Goals (Current goals can be found in the Care Plan section)  Acute Rehab PT Goals Patient Stated Goal: to go home PT Goal Formulation: With patient Time For Goal Achievement: 08/04/16 Potential to Achieve Goals: Good    Frequency Min 3X/week   Barriers to discharge Decreased caregiver support alone most of day    Co-evaluation               End of Session Equipment Utilized During Treatment: Gait belt Activity Tolerance: Patient tolerated treatment well Patient left: in chair;with call bell/phone within reach;with family/visitor present Nurse Communication: Mobility status         Time: [id number] PT Time Calculation (min) (ACUTE ONLY): 20 min   Charges:   PT Evaluation $PT Eval Moderate Complexity: 1 Procedure     PT G Codes:        Jahmier Willadsen A Uriel Horkey 07/21/2016, 11:14 AM Wray Kearns, PT, DPT [phone]

## 2016-07-21 NOTE — Progress Notes (Signed)
PT Cancellation Note  Patient Details Name: Tammy Lloyd MRN: [id number] DOB: June 07, 1924   Cancelled Treatment:    Reason Eval/Treat Not Completed: Patient not medically ready Pt on bedrest. Will follow up as appropriate.   [name] 07/21/2016, 8:25 AM Wray Kearns, [place], DPT [phone]

## 2016-07-21 NOTE — Progress Notes (Signed)
STROKE TEAM PROGRESS NOTE   HISTORY OF PRESENT ILLNESS (per record) Tammy Lloyd is an 83 y.o. female with a prior history of TIA who presents with acute onset of dysphasia. She was at home cooking dinner when she had sudden onset of "trouble getting her words out". Her prior TIA had the same symptoms. She has a history of atrial fibrillation. When asked, she denies taking ASA, Plavix, Aggrenox or a blood thinner (specifically stated no to dabigatran, Eliquis, Xarelto and Coumadin/warfarin). Given her aphasia, family was unsure if her responses are reliable and were asked to locate her medications list to ensure that she is not on an anticoagulant in order to make decision to consent or not consent to tPA. The patient is unable to make complex medical decisions in the context of her aphasia. Daughter arrived to ED and verified that patient was not taking an anticoagulant; previously was on a DOAC but stopped taking on her own as she thought it might be causing side effect of itching. LKN 5:50 PM 07/20/2016. NIHSS =  2. Patient was administered IV t-PA 1/11/12018 at 2008. She was admitted to the neuro ICU for further evaluation and treatment.   SUBJECTIVE (INTERVAL HISTORY) Her daughter is at the bedside.  Overall she feels her condition is rapidly improving. Patient lives with daughter who is on methadone. She has a pill box and takes meds regularly, though daughter at bedside does report some mild memory issues. She has not been taking xarelto regularly and for sure not for last few days as per family   OBJECTIVE Temp:  [97.9 F (36.6 C)-99.1 F (37.3 C)] 97.9 F (36.6 C) (01/12 0400) Pulse Rate:  [53-91] 64 (01/12 0700) Cardiac Rhythm: Atrial fibrillation (01/12 0600) Resp:  [13-24] 17 (01/12 0700) BP: (133-190)/(56-110) 151/58 (01/12 0700) SpO2:  [93 %-100 %] 96 % (01/12 0700) Weight:  [68.7 kg (151 lb 7.3 oz)-74.8 kg (165 lb)] 68.7 kg (151 lb 7.3 oz) (01/12 0130)  CBC:  Recent Labs Lab  07/20/16 1845 07/20/16 1852  WBC 8.7  --   NEUTROABS 5.1  --   HGB 13.4 13.9  HCT 39.1 41.0  MCV 96.5  --   PLT 348  --     Basic Metabolic Panel:  Recent Labs Lab 07/20/16 1845 07/20/16 1852  NA 137 137  K 4.3 4.3  CL 102 100*  CO2 26  --   GLUCOSE 120* 119*  BUN 24* 28*  CREATININE 1.11* 1.20*  CALCIUM 9.4  --     Lipid Panel:    Component Value Date/Time   CHOL 172 07/21/2016 0344   TRIG 58 07/21/2016 0344   HDL 46 07/21/2016 0344   CHOLHDL 3.7 07/21/2016 0344   VLDL 12 07/21/2016 0344   LDLCALC 114 (H) 07/21/2016 0344   HgbA1c:  Lab Results  Component Value Date   HGBA1C 6.3 (H) 10/06/2013   Urine Drug Screen: No results found for: LABOPIA, [place], LABBENZ, AMPHETMU, THCU, [place]  Ct Head Code Stroke W/o Cm 07/20/2016 1. No evidence of large territory acute infarct, hemorrhage, or new focal mass effect. 2. New lucency in right thalamus is probably an interval chronic lacunar infarct. 3. ASPECTS is 10 4. Stable calcified dural-based mass in the left posterior fossa probably representing meningioma.   Ct Angio Head W Or Wo Contrast Ct Angio Neck W Or Wo Contrast 07/20/2016 1. Right vertebral artery origin calcified plaque with moderate 60% stenosis. 2. Otherwise no evidence of dissection,  significant stenosis, or dissection of the carotid and vertebral arteries of the neck. 3. No significant stenosis, proximal occlusion, aneurysm, or vascular malformation of the circle of Willis.   MRI brain Pending   PHYSICAL EXAM Pleasant elderly Caucasian lady not in distress. . Afebrile. Head is nontraumatic. Neck is supple without bruit.    Cardiac exam no murmur or gallop. Lungs are clear to auscultation. Distal pulses are well felt. Neurological Exam ;  Awake  Alert oriented x 3. Normal speech and language.eye movements full without nystagmus.fundi were not visualized. Vision acuity and fields appear normal. Hearing is normal. Palatal movements are  normal. Face symmetric. Tongue midline. Normal strength, tone, reflexes and coordination. Normal sensation. Gait deferred. NIHSS 0 ASSESSMENT/PLAN Tammy Lloyd is a 83 y.o. female with history of atrial fibrillation on xarelto presenting with dysphasia. She received IV t-PA 07/20/2016 at 2005.   Stroke:  L brain infarct, likely embolic secondary to known atrial fibrillation, s/p IV tPA. Workup underway.  Resultant  Dysphasia improved  Code stroke CT no acute infarct. aspects 10  CTA head/neck R VA 60% stenosis, o/w ok  MRI  pending   2D Echo  pending   cancel carotid doppler  LDL 114  HgbA1c pending  SCDs for VTE prophylaxis  Diet NPO time specified  Xarelto (rivaroxaban) daily prior to admission (though had recently stopped for a while because she thought it made her itch, but she had restarted taking it per her and daughter), now on No antithrombotic in hospital given tPA administration. Plan to resume xarelto if 24h imaging negative for hemorrhage   Ongoing aggressive stroke risk factor management  Therapy recommendations:  pending. Ok to be OOB. Orders written  Disposition:  pending (daughter lives with her, daughter is on methadone, plans are for daughter to manage pt's meds per other daughter at bedside)  Atrial Fibrillation  Home anticoagulation:  Xarelto (rivaroxaban) daily   Continue xarelto once 24h imaging post tPA neg for hemorrhage   Hypertension  Stable  Resume home BP meds  Long BP goal normotensive  Hyperlipidemia  Home meds:  No statin  LDL 114, goal < 70  Add statin once able to swallow  Other Stroke Risk Factors  Advanced age  Other Active Problems  Acute renal failure, creatinine 1.2  Hospital day # [place] for Pager information 07/21/2016 1:42 PM  I have personally examined this patient, reviewed notes, independently viewed imaging studies, participated in medical decision making  and plan of care.ROS completed by me personally and pertinent positives fully documented  I have made any additions or clarifications directly to the above note. Agree with note above. She presented with speech and language difficulties secondary to left MCA branch infarct from atrial fibrillation. She received IV tPA and has made good clinical recovery. Continue ICU monitoring and close neurological follow-up and strict blood pressure control. Check MRI scan of the brain and echocardiogram. Patient counseled to be compliant with Xarelto. Family needs to come up with the plan to better supervise her daily medication taking. This patient is critically ill and at significant risk of neurological worsening, death and care requires constant monitoring of vital signs, hemodynamics,respiratory and cardiac monitoring, extensive review of multiple databases, frequent neurological assessment, discussion with family, other specialists and medical decision making of high complexity.I have made any additions or clarifications directly to the above note.This critical care time does not reflect procedure time, or teaching time or supervisory  time of PA/NP/Med Resident etc but could involve care discussion time.  I spent 30 minutes of neurocritical care time  in the care of  this patient.      Antony Contras, MD Medical Director [facility] Stroke Center Pager: [phone] 07/21/2016 1:57 PM  To contact Stroke Continuity provider, please refer to [email or web address] After hours, contact General Neurology

## 2016-07-21 NOTE — Plan of Care (Signed)
Problem: Nutrition: Goal: Risk of aspiration will decrease Outcome: Completed/Met Date Met: 07/21/16 Passed RN swallow screen - will continue to monitor closely

## 2016-07-21 NOTE — Progress Notes (Signed)
OT Cancellation Note  Patient Details Name: Tammy Lloyd MRN: [id number] DOB: 23-Jul-1923   Cancelled Treatment:    Reason Eval/Treat Not Completed: Patient not medically ready. Pt on strict bed rest due to TPA will check back at a later time to see if bedrest discontinued.  Almon Register [id number] 07/21/2016, 7:40 AM

## 2016-07-21 NOTE — Progress Notes (Signed)
  Echocardiogram 2D Echocardiogram with Definity has been performed.  Diamond Nickel 07/21/2016, 12:06 PM

## 2016-07-21 NOTE — Evaluation (Signed)
Occupational Therapy Evaluation Patient Details Name: Tammy Lloyd MRN: [id number] DOB: Feb 12, 1924 Today's Date: 07/21/2016    History of Present Illness Patient is a 83 y/o female with hx of CVA, pulmonary HTN, cataracts, cancer presents with difficulty getting words out. Head CT-New lucency in right thalamus is probably an interval chronic lacunar infarct. s/p tPA.   Clinical Impression   This 83 yo female admitted with above presents to acute OT with deficits below (see OT problem list) thus affecting her PLOF of being totally independent with all basic and IADLs. She will benefit from acute OT with follow up [place] to get back to her PLOF.    Follow Up Recommendations  Home health OT;Supervision/Assistance - 24 hour;Other (comment) (24/7 S/prn intially until family feel she is safe to be alone)    Equipment Recommendations  None recommended by OT       Precautions / Restrictions Precautions Precautions: Fall Restrictions Weight Bearing Restrictions: No      Mobility Bed Mobility Overal bed mobility: Needs Assistance Bed Mobility: Supine to Sit     Supine to sit: Supervision;HOB elevated        Transfers Overall transfer level: Needs assistance Equipment used: Rolling walker (2 wheeled) Transfers: Sit to/from Stand Sit to Stand: Min guard              Balance Overall balance assessment: Needs assistance Sitting-balance support: Feet supported;No upper extremity supported Sitting balance-Leahy Scale: Good     Standing balance support: During functional activity;Single extremity supported Standing balance-Leahy Scale: Poor Standing balance comment: feels safer with 1 UE on surface at all times                            ADL Overall ADL's : Needs assistance/impaired Eating/Feeding: Independent;Sitting   Grooming: Set up;Sitting   Upper Body Bathing: Set up;Sitting   Lower Body Bathing: Min guard;Sit to/from stand   Upper Body Dressing :  Set up;Sitting   Lower Body Dressing: Min guard;Sit to/from stand   Toilet Transfer: Min guard;Ambulation;Regular Toilet;Grab bars;RW   Toileting- Water quality scientist and Hygiene: Min guard;Sit to/from stand               Vision Vision Assessment?: Yes Eye Alignment: Within Functional Limits Ocular Range of Motion: Within Functional Limits Alignment/Gaze Preference: Within Defined Limits Tracking/Visual Pursuits: Able to track stimulus in all quads without difficulty Saccades: Within functional limits Convergence: Within functional limits Visual Fields: No apparent deficits          Pertinent Vitals/Pain Pain Assessment: No/denies pain     Hand Dominance  (writes with right hand but everything else with left hand)   Extremity/Trunk Assessment Upper Extremity Assessment Upper Extremity Assessment: Overall WFL for tasks assessed           Communication Communication Communication: HOH   Cognition Arousal/Alertness: Awake/alert Behavior During Therapy: WFL for tasks assessed/performed Overall Cognitive Status: Within Functional Limits for tasks assessed                                Home Living Family/patient expects to be discharged to:: Private residence Living Arrangements: Other relatives (grand-daughter) Available Help at Discharge: Family;Available PRN/intermittently Type of Home: House Home Access: Stairs to enter [employer] of Steps: 5 Entrance Stairs-Rails: Right Home Layout: One level;Laundry or work area in basement (has a Scientist, research (life sciences))     Bathroom  Shower/Tub: Tub/shower unit;Curtain Shower/tub characteristics: Architectural technologist: Standard     Home Equipment: Environmental consultant - 2 wheels;Cane - single point;Tub bench          Prior Functioning/Environment Level of Independence: Independent with assistive device(s)        Comments: USes SPC for ambulation. Drives. does laundry, cooks, cleans. no falls reported in  last year.        OT Problem List: Impaired balance (sitting and/or standing)   OT Treatment/Interventions: Self-care/ADL training;Patient/family education;DME and/or AE instruction;Balance training    OT Goals(Current goals can be found in the care plan section) Acute Rehab OT Goals Patient Stated Goal: to go home OT Goal Formulation: With patient Time For Goal Achievement: 07/28/16 Potential to Achieve Goals: Good  OT Frequency: Min 2X/week              End of Session Equipment Utilized During Treatment: Rolling walker;Gait belt Nurse Communication:  (RN ok'd for pt to not have chair alarm)  Activity Tolerance: Patient tolerated treatment well Patient left: in chair;with call bell/phone within reach;with family/visitor present   Time: 1226-1252 OT Time Calculation (min): 26 min Charges:  OT General Charges $OT Visit: 1 Procedure OT Evaluation $OT Eval Moderate Complexity: 1 Procedure OT Treatments $Self Care/Home Management : 8-22 mins  Almon Register [id number] 07/21/2016, 3:41 PM

## 2016-07-22 DIAGNOSIS — G451 Carotid artery syndrome (hemispheric): Secondary | ICD-10-CM

## 2016-07-22 DIAGNOSIS — I10 Essential (primary) hypertension: Secondary | ICD-10-CM

## 2016-07-22 DIAGNOSIS — I482 Chronic atrial fibrillation: Secondary | ICD-10-CM

## 2016-07-22 DIAGNOSIS — I481 Persistent atrial fibrillation: Secondary | ICD-10-CM

## 2016-07-22 DIAGNOSIS — R001 Bradycardia, unspecified: Secondary | ICD-10-CM

## 2016-07-22 DIAGNOSIS — E782 Mixed hyperlipidemia: Secondary | ICD-10-CM

## 2016-07-22 DIAGNOSIS — E785 Hyperlipidemia, unspecified: Secondary | ICD-10-CM

## 2016-07-22 LAB — BASIC METABOLIC PANEL
ANION GAP: 8 (ref 5–15)
BUN: 20 mg/dL (ref 6–20)
CALCIUM: 8.4 mg/dL — AB (ref 8.9–10.3)
CO2: 23 mmol/L (ref 22–32)
Chloride: 103 mmol/L (ref 101–111)
Creatinine, Ser: 0.96 mg/dL (ref 0.44–1.00)
GFR calc Af Amer: 59 mL/min — ABNORMAL LOW (ref >60)
GFR calc non Af Amer: 51 mL/min — ABNORMAL LOW (ref >60)
GLUCOSE: 93 mg/dL (ref 65–99)
POTASSIUM: 3.6 mmol/L (ref 3.5–5.1)
Sodium: 134 mmol/L — ABNORMAL LOW (ref 135–145)

## 2016-07-22 LAB — CBC
HEMATOCRIT: 30.7 % — AB (ref 36.0–46.0)
Hemoglobin: 10.6 g/dL — ABNORMAL LOW (ref 12.0–15.0)
MCH: 32.8 pg (ref 26.0–34.0)
MCHC: 34.5 g/dL (ref 30.0–36.0)
MCV: 95 fL (ref 78.0–100.0)
Platelets: 292 10*3/uL (ref 150–400)
RBC: 3.23 MIL/uL — ABNORMAL LOW (ref 3.87–5.11)
RDW: 13.2 % (ref 11.5–15.5)
WBC: 10 10*3/uL (ref 4.0–10.5)

## 2016-07-22 LAB — HEMOGLOBIN A1C
Hgb A1c MFr Bld: 5.2 % (ref 4.8–5.6)
MEAN PLASMA GLUCOSE: 103 mg/dL

## 2016-07-22 MED ORDER — RIVAROXABAN 15 MG PO TABS: 15.0000 mg | ORAL_TABLET | Freq: Every day | ORAL | 6 refills | Status: DC

## 2016-07-22 MED ORDER — PRAVASTATIN SODIUM 20 MG PO TABS: 20.0000 mg | ORAL_TABLET | Freq: Every day | ORAL | 6 refills | Status: DC

## 2016-07-22 MED ORDER — PANTOPRAZOLE SODIUM 40 MG PO TBEC: 40.0000 mg | DELAYED_RELEASE_TABLET | Freq: Every day | ORAL | Status: DC

## 2016-07-22 NOTE — Discharge Instructions (Signed)
1. Home health physical and occupational therapies will be arranged.

## 2016-07-22 NOTE — Consult Note (Signed)
CARDIOLOGY CONSULT NOTE   Patient ID: KROSBY BRING MRN: [id number], DOB/AGE: 1923-12-28   Admit date: 07/20/2016 Date of Consult: 07/22/2016  Primary Physician: [name], MD Primary Cardiologist: none, new patient  Reason for consult:  Stroke, a-fib, chronic, persistent  Problem List  Past Medical History:  Diagnosis Date  . Arthritis   . Cancer ([place])    Breast  . Cataract   . Colon polyp   . Edema of both legs   . Hemochromatosis    Followed by  hematology;  . Hemorrhoid   . Hypertension   . Idiopathic hemochromatosis ([place]) 09/28/2011  . Lichen sclerosus   . Osteoporosis 2005   DEXA 2015 T score -1.7 stable from prior DEXA 2011 history of Fosamax 8 years. On drug-free holiday now  . Pulmonary hypertension   . Stroke [facility])     Past Surgical History:  Procedure Laterality Date  . BACK SURGERY  2008  . BREAST LUMPECTOMY     Right  . CATARACT EXTRACTION, BILATERAL    . COLONOSCOPY  2009  . DILATION AND CURETTAGE OF UTERUS  2005  . HYSTEROSCOPY  08/2003  . LEFT HEART CATHETERIZATION WITH CORONARY ANGIOGRAM N/A 10/24/2011   Procedure: LEFT HEART CATHETERIZATION WITH CORONARY ANGIOGRAM;  Surgeon: Clent Demark, MD;  Location: [place] CATH LAB;  Service: Cardiovascular;  Laterality: N/A;  . TOTAL KNEE ARTHROPLASTY  2006     Allergies  Allergies  Allergen Reactions  . Tape Other (See Comments)    Skin is thin and may tear or bruise easily    HPI   Tammy Lloyd an 83 y.o.femalewith a prior history of TIA who presented with acute onset of dysphasia on 07/20/2016. She was at home cooking dinner when she had sudden onset of "trouble getting her words out". Her prior TIA had the same symptoms. She has a history of atrial fibrillation - chronic persistent on ECGs since 2015. When asked, she denies taking ASA, Plavix, Aggrenox or a blood thinner (specifically stated no to dabigatran, Eliquis, Xarelto and Coumadin/warfarin). She hasn't been seen by a  cardiologist in the past.  She was given tPA 1/11/12018 at 2008 with improvement of dysphagia.  MRI brain on 07/21/2016 showed punctate acute ischemia in the right mid brain and, possibly, within the right pons, without associated hemorrhage or mass effect. Chronic microvascular ischemia. While in the hospital she developed a-fib with slow ventricular response and cardiology was called. The patient was asleep, denies any dizziness, presyncope or syncope.   Inpatient Medications  . losartan  100 mg Oral Daily  . pantoprazole  40 mg Oral QHS  . pravastatin  20 mg Oral QHS  . rivaroxaban  15 mg Oral Q supper    Family History Family History  Problem Relation Age of Onset  . Cancer Mother 50    GYN cancer (had hysterectomy)  . Heart disease Mother     CHF  . Cancer Father     liver  . Asthma Sister   . Breast cancer Sister 17  . Heart disease Brother     rheumatic heart attack  . Breast cancer Sister 6  . Asthma Brother   . Cancer Brother     Prostate  . Cancer Brother     Prostate     Social History Social History   Social History  . Marital status: Married    Spouse name: N/A  . Number of children: N/A  . Years of education: N/A  Occupational History  . Not on file.   Social History Main Topics  . Smoking status: Never Smoker  . Smokeless tobacco: Never Used  . Alcohol use No  . Drug use: No  . Sexual activity: No     Comment: 1st intercourse 83 yo-Fewer than 5 partners   Other Topics Concern  . Not on file   Social History Narrative   Lives alone.  Granddaughter is currently living with her. No pets     Review of Systems  General:  No chills, fever, night sweats or weight changes.  Cardiovascular:  No chest pain, dyspnea on exertion, edema, orthopnea, palpitations, paroxysmal nocturnal dyspnea. Dermatological: No rash, lesions/masses Respiratory: No cough, dyspnea Urologic: No hematuria, dysuria Abdominal:   No nausea, vomiting, diarrhea, bright red  blood per rectum, melena, or hematemesis Neurologic:  No visual changes, wkns, changes in mental status. All other systems reviewed and are otherwise negative except as noted above.  Physical Exam  Blood pressure (!) 124/38, pulse 67, temperature 98 F (36.7 C), temperature source Oral, resp. rate 18, height 5\' 7"  (1.702 m), weight 151 lb 7.3 oz (68.7 kg), SpO2 97 %.  General: Pleasant, NAD Psych: Normal affect. Neuro: Alert and oriented X 3. Moves all extremities spontaneously. HEENT: Normal  Neck: Supple without bruits or JVD. Lungs:  Resp regular and unlabored, CTA. Heart: iRRR no s3, s4, or murmurs. Abdomen: Soft, non-tender, non-distended, BS + x 4.  Extremities: No clubbing, cyanosis or edema. DP/PT/Radials 2+ and equal bilaterally.  Labs  No results for input(s): CKTOTAL, CKMB, TROPONINI in the last 72 hours. Lab Results  Component Value Date   WBC 10.0 07/22/2016   HGB 10.6 (L) 07/22/2016   HCT 30.7 (L) 07/22/2016   MCV 95.0 07/22/2016   PLT 292 07/22/2016    Recent Labs Lab 07/20/16 1845  07/22/16 0230  NA 137  < > 134*  K 4.3  < > 3.6  CL 102  < > 103  CO2 26  --  23  BUN 24*  < > 20  CREATININE 1.11*  < > 0.96  CALCIUM 9.4  --  8.4*  PROT 7.5  --   --   BILITOT 0.7  --   --   ALKPHOS 83  --   --   ALT 12*  --   --   AST 19  --   --   GLUCOSE 120*  < > 93  < > = values in this interval not displayed. Lab Results  Component Value Date   CHOL 172 07/21/2016   HDL 46 07/21/2016   LDLCALC 114 (H) 07/21/2016   TRIG 58 07/21/2016   Radiology/Studies  Ct Angio Neck W Or Wo Contrast  Result Date: 07/20/2016 IMPRESSION: 1. Right vertebral artery origin calcified plaque with moderate 60% stenosis. 2. Otherwise no evidence of dissection, significant stenosis, or dissection of the carotid and vertebral arteries of the neck. 3. No significant stenosis, proximal occlusion, aneurysm, or vascular malformation of the circle of Willis. These results were called by  telephone at the time of interpretation on 07/20/2016 at 8:08 pm to Dr. Cheral Marker, who verbally acknowledged these results. Electronically Signed   By: Kristine Garbe M.D.   On: 07/20/2016 20:20   Mr Brain Wo Contrast  Result Date: 07/21/2016  IMPRESSION: 1. Punctate acute ischemia in the right mid brain and, possibly, within the right pons, without associated hemorrhage or mass effect. 2. Chronic microvascular ischemia. Electronically Signed   By: Lennette Bihari  [place] M.D.   On: 07/21/2016 23:30   Ct Head Code Stroke W/o Cm  Addendum Date: 07/20/2016    IMPRESSION: 1. No evidence of large territory acute infarct, hemorrhage, or new focal mass effect. 2. New lucency in right thalamus is probably an interval chronic lacunar infarct. 3. ASPECTS is 10 4. Stable calcified dural-based mass in the left posterior fossa probably representing meningioma. Electronically Signed: By: Kristine Garbe M.D. On: 07/20/2016 19:17   Echocardiogram 07/21/2016 - Left ventricle: The cavity size was normal. Wall thickness was   normal. Systolic function was normal. The estimated ejection   fraction was in the range of 55% to 60%. Wall motion was normal;   there were no regional wall motion abnormalities. - Mitral valve: There was mild regurgitation. - Left atrium: The atrium was mildly dilated. - Right atrium: The atrium was mildly dilated. - Atrial septum: No defect or patent foramen ovale was identified.  ECG: a-fib, negative T waves in the lateral leads.     ASSESSMENT AND PLAN  1. Chronic persistent a-fib with RVR, complicated by TIA, not previously on anticoagulation CHADS-VASc = 6, I agree with Xarelto. She now has slow ventricular response predominantly at night, the longest R-R interval 3.1 seconds, she is asymptomatic. TTE showed normal LVEF, mildly dilated left atrium. We will arrange for an outpatient follow up. If she is symptomatic, we will start 48 hour Holter monitor.   Amlodipine  was discontinued, even though is not a typical AVN blocking agent, but it can cause bradycardia in some cases.   2. Essential hypertension - controlled on losartan    3. TIA - agree with Xarelto and statin  Signed, Ena Dawley, MD, [facility] 07/22/2016, 11:34 AM

## 2016-07-22 NOTE — Progress Notes (Signed)
Physical Therapy Treatment Patient Details Name: Tammy Lloyd MRN: [id number] DOB: December 19, 1923 Today's Date: 07/22/2016    History of Present Illness Patient is a 83 y/o female with hx of CVA, pulmonary HTN, cataracts, cancer presents with difficulty getting words out. Head CT-New lucency in right thalamus is probably an interval chronic lacunar infarct. s/p tPA.    PT Comments    Pt making progress with mobility/PT goals.  Ambulated 200' using RW with supervision.  Completed stair training today.  Cont with current POC to maximize functional mobility prior to d/c home.    Follow Up Recommendations  Home health PT;Supervision for mobility/OOB     Equipment Recommendations  None recommended by PT    Recommendations for Other Services OT consult     Precautions / Restrictions Precautions Precautions: Fall Restrictions Weight Bearing Restrictions: No    Mobility  Bed Mobility Overal bed mobility: Modified Independent Bed Mobility: Supine to Sit              Transfers Overall transfer level: Needs assistance Equipment used: Rolling walker (2 wheeled) Transfers: Sit to/from Stand Sit to Stand: Supervision         General transfer comment: supervision for safety.  pt demonstrated safe hand placement.   Ambulation/Gait Ambulation/Gait assistance: Supervision Ambulation Distance (Feet): 200 Feet Assistive device: Rolling walker (2 wheeled) Gait Pattern/deviations: Step-to pattern;Step-through pattern Gait velocity: decreased- age appropriate though   General Gait Details: slow guarded gt initially but fluidity improved as distance increased.     Stairs Stairs: Yes   Stair Management: One rail Right;Step to pattern;Forwards Number of Stairs: 4 General stair comments: guarding for safety.    Wheelchair Mobility    Modified Rankin (Stroke Patients Only)       Balance                                    Cognition Arousal/Alertness:  Awake/alert Behavior During Therapy: WFL for tasks assessed/performed Overall Cognitive Status: Within Functional Limits for tasks assessed                      Exercises      General Comments        Pertinent Vitals/Pain Pain Assessment: No/denies pain    Home Living     Available Help at Discharge: Family;Available PRN/intermittently Type of Home: House              Prior Function            PT Goals (current goals can now be found in the care plan section) Acute Rehab PT Goals Patient Stated Goal: to go home PT Goal Formulation: With patient Time For Goal Achievement: 08/04/16 Potential to Achieve Goals: Good Progress towards PT goals: Progressing toward goals    Frequency    Min 3X/week      PT Plan Current plan remains appropriate    Co-evaluation             End of Session Equipment Utilized During Treatment: Gait belt Activity Tolerance: Patient tolerated treatment well Patient left: in chair;with call bell/phone within reach     Time: 1000-1019 PT Time Calculation (min) (ACUTE ONLY): 19 min  Charges:  $Gait Training: 8-22 mins                    G Codes:      Sena Hitch 07/22/2016,  10:25 AM   Sarajane Marek, PTA [phone] 07/22/2016

## 2016-07-22 NOTE — Progress Notes (Signed)
Pt's HR is sustaining in the high 40s and low 50s, and occasionally decreases to the 30s. Rhythn is A-fib. Pt denies SOB, CP, and dizziness.  MD notified. Will order cardiology consult.     07/22/16 0600  Vitals  Temp 98 F (36.7 C)  Temp Source Oral  BP (!) 121/55  ECG Heart Rate (!) 56  Resp 16  Oxygen Therapy  SpO2 98 %  O2 Device Room Air

## 2016-07-22 NOTE — Evaluation (Signed)
Speech Language Pathology Evaluation Patient Details Name: Tammy Lloyd MRN: [id number] DOB: 09-Jul-1924 Today's Date: 07/22/2016 Time: [id number] SLP Time Calculation (min) (ACUTE ONLY): 8 min  Problem List:  Patient Active Problem List   Diagnosis Date Noted  . Stroke (cerebrum) ([place]) 07/20/2016  . Abnormality of gait 10/06/2013  . Acute CVA (cerebrovascular accident) ([place]) 10/05/2013  . Numbness of left hand 10/05/2013  . Meningioma ([place]) 10/05/2013  . Atrial fibrillation ([place]) 10/05/2013  . Acute coronary syndrome ([place]) 10/23/2011  . Idiopathic hemochromatosis ([place]) 09/28/2011  . GERD (gastroesophageal reflux disease) 09/06/2011  . Hypertension 12/19/2010  . Arthritis 12/19/2010  . History of breast cancer 12/19/2010   Past Medical History:  Past Medical History:  Diagnosis Date  . Arthritis   . Cancer ([place])    Breast  . Cataract   . Colon polyp   . Edema of both legs   . Hemochromatosis    Followed by  hematology;  . Hemorrhoid   . Hypertension   . Idiopathic hemochromatosis ([place]) 09/28/2011  . Lichen sclerosus   . Osteoporosis 2005   DEXA 2015 T score -1.7 stable from prior DEXA 2011 history of Fosamax 8 years. On drug-free holiday now  . Pulmonary hypertension   . Stroke [facility])    Past Surgical History:  Past Surgical History:  Procedure Laterality Date  . BACK SURGERY  2008  . BREAST LUMPECTOMY     Right  . CATARACT EXTRACTION, BILATERAL    . COLONOSCOPY  2009  . DILATION AND CURETTAGE OF UTERUS  2005  . HYSTEROSCOPY  08/2003  . LEFT HEART CATHETERIZATION WITH CORONARY ANGIOGRAM N/A 10/24/2011   Procedure: LEFT HEART CATHETERIZATION WITH CORONARY ANGIOGRAM;  Surgeon: Clent Demark, MD;  Location: [place] CATH LAB;  Service: Cardiovascular;  Laterality: N/A;  . TOTAL KNEE ARTHROPLASTY  2006   HPI:  Patient is a 83 y/o female with hx of CVA, pulmonary HTN, cataracts, cancer presents with difficulty getting words out. Head CT new lucency in right thalamus is  probably an interval chronic lacunar infarct. S/p tPA.   Assessment / Plan / Recommendation Clinical Impression  Pt reports living alone with daughter who is "in and out." She scored within functional limits for attention, memory, awareness, divergent naming. Speech is intelligible without language disturbance. Pt does not need further ST.  Pt reports writing as compensatory memory stategy and SLP educated pt to continue writing and use assist of daughter wehn/if needed in the future for medication or financial organization.     SLP Assessment  Patient does not need any further Speech Lanaguage Pathology Services    Follow Up Recommendations  None    Frequency and Duration           SLP Evaluation Cognition  Overall Cognitive Status: Within Functional Limits for tasks assessed Orientation Level: Oriented X4       Comprehension  Auditory Comprehension Overall Auditory Comprehension: Appears within functional limits for tasks assessed Visual Recognition/Discrimination Discrimination: Not tested Reading Comprehension Reading Status: Within funtional limits    Expression Expression Primary Mode of Expression: Verbal Verbal Expression Overall Verbal Expression: Appears within functional limits for tasks assessed Written Expression Dominant Hand:  (writes with right hand but everything else with left hand) Written Expression: Not tested   Oral / Motor  Oral Motor/Sensory Function Overall Oral Motor/Sensory Function: Mild impairment Facial ROM: Reduced right (suspect from prior CVA) Facial Symmetry: Abnormal symmetry right Facial Strength: Reduced right Lingual ROM: Within Functional Limits Lingual Symmetry: Abnormal  symmetry right Velum: Within Functional Limits Motor Speech Overall Motor Speech: Appears within functional limits for tasks assessed   GO                    Houston Siren 07/22/2016, 9:13 AM   Orbie Pyo Colvin Caroli.[employer]  [phone]

## 2016-07-22 NOTE — Discharge Summary (Signed)
Stroke Discharge Summary  Patient ID: Tammy Lloyd   MRN: [id number]      DOB: February 10, 1924  Date of Admission: 07/20/2016 Date of Discharge: 07/22/2016  Attending Physician:  Rosalin Hawking, MD, Stroke MD Consulting Physician(s):    cardiology Dr [name]  Patient's PCP:  [name], MD  DISCHARGE DIAGNOSIS: Acute stroke within the right pons Active Problems:   TIA s/p tPA   afib not compliant with Xarelto   HTN   BMI: Body mass index is 23.72 kg/m.  Past Medical History:  Diagnosis Date  . Arthritis   . Cancer ([place])    Breast  . Cataract   . Colon polyp   . Edema of both legs   . Hemochromatosis    Followed by  hematology;  . Hemorrhoid   . Hypertension   . Idiopathic hemochromatosis ([place]) 09/28/2011  . Lichen sclerosus   . Osteoporosis 2005   DEXA 2015 T score -1.7 stable from prior DEXA 2011 history of Fosamax 8 years. On drug-free holiday now  . Pulmonary hypertension   . Stroke [facility])    Past Surgical History:  Procedure Laterality Date  . BACK SURGERY  2008  . BREAST LUMPECTOMY     Right  . CATARACT EXTRACTION, BILATERAL    . COLONOSCOPY  2009  . DILATION AND CURETTAGE OF UTERUS  2005  . HYSTEROSCOPY  08/2003  . LEFT HEART CATHETERIZATION WITH CORONARY ANGIOGRAM N/A 10/24/2011   Procedure: LEFT HEART CATHETERIZATION WITH CORONARY ANGIOGRAM;  Surgeon: Clent Demark, MD;  Location: [facility] CATH [facility];  Service: Cardiovascular;  Laterality: N/A;  . TOTAL KNEE ARTHROPLASTY  2006    Allergies as of 07/22/2016      Reactions   Tape Other (See Comments)   Skin is thin and may tear or bruise easily      Medication List    STOP taking these medications   amLODipine 10 MG tablet Commonly known as:  NORVASC   aspirin 81 MG tablet   losartan-hydrochlorothiazide 100-25 MG tablet Commonly known as:  HYZAAR     TAKE these medications   acetaminophen 325 MG tablet Commonly known as:  TYLENOL Take 650 mg by mouth every 6 (six) hours as needed for headache  (or pain). Reported on 10/14/2015   clobetasol cream 0.05 % Commonly known as:  TEMOVATE Apply 1 application topically 2 (two) times daily as needed (for itching). Reported on 10/14/2015   losartan 100 MG tablet Commonly known as:  COZAAR Take 1 tablet (100 mg total) by mouth daily.   mupirocin cream 2 % Commonly known as:  BACTROBAN Apply 1 application topically 2 (two) times daily. Use to affected areas of skin (scabs/scratched areas) to prevent infection   pravastatin 20 MG tablet Commonly known as:  PRAVACHOL Take 1 tablet (20 mg total) by mouth at bedtime.   Prednicarbate 0.1 % Crea Apply 1 application topically 2 (two) times daily as needed. For itching   PRESERVISION AREDS PO Take 1 capsule by mouth 2 (two) times daily.   Rivaroxaban 15 MG Tabs tablet Commonly known as:  XARELTO Take 1 tablet (15 mg total) by mouth daily with supper.       LABORATORY STUDIES CBC    Component Value Date/Time   WBC 10.0 07/22/2016 0230   RBC 3.23 (L) 07/22/2016 0230   HGB 10.6 (L) 07/22/2016 0230   HGB 11.6 06/19/2014 1425   HCT 30.7 (L) 07/22/2016 0230   HCT 35.9 06/19/2014 1425  PLT 292 07/22/2016 0230   PLT 295 06/19/2014 1425   MCV 95.0 07/22/2016 0230   MCV 98.8 06/19/2014 1425   MCH 32.8 07/22/2016 0230   MCHC 34.5 07/22/2016 0230   RDW 13.2 07/22/2016 0230   RDW 13.1 06/19/2014 1425   LYMPHSABS 2.4 07/20/2016 1845   LYMPHSABS 3.0 06/19/2014 1425   MONOABS 0.9 07/20/2016 1845   MONOABS 0.9 06/19/2014 1425   EOSABS 0.3 07/20/2016 1845   EOSABS 0.3 06/19/2014 1425   EOSABS 0.3 04/08/2010 1455   BASOSABS 0.1 07/20/2016 1845   BASOSABS 0.1 06/19/2014 1425   CMP    Component Value Date/Time   NA 134 (L) 07/22/2016 0230   NA 139 06/10/2013 0936   K 3.6 07/22/2016 0230   K 4.7 06/10/2013 0936   CL 103 07/22/2016 0230   CL 106 09/30/2012 0913   CO2 23 07/22/2016 0230   CO2 24 06/10/2013 0936   GLUCOSE 93 07/22/2016 0230   GLUCOSE 101 06/10/2013 0936   GLUCOSE 95  09/30/2012 0913   BUN 20 07/22/2016 0230   BUN 27.8 (H) 06/10/2013 0936   CREATININE 0.96 07/22/2016 0230   CREATININE 1.34 (H) 04/28/2015 0001   CREATININE 1.2 (H) 06/10/2013 0936   CALCIUM 8.4 (L) 07/22/2016 0230   CALCIUM 9.4 06/10/2013 0936   PROT 7.5 07/20/2016 1845   PROT 6.8 01/02/2013 0918   ALBUMIN 4.3 07/20/2016 1845   ALBUMIN 3.5 01/02/2013 0918   AST 19 07/20/2016 1845   AST 13 01/02/2013 0918   ALT 12 (L) 07/20/2016 1845   ALT 7 01/02/2013 0918   ALKPHOS 83 07/20/2016 1845   ALKPHOS 77 01/02/2013 0918   BILITOT 0.7 07/20/2016 1845   BILITOT 0.31 01/02/2013 0918   GFRNONAA 51 (L) 07/22/2016 0230   GFRAA 59 (L) 07/22/2016 0230   COAGS Lab Results  Component Value Date   INR 1.11 07/20/2016   INR 1.21 10/24/2011   INR 1.03 10/23/2011   Lipid Panel    Component Value Date/Time   CHOL 172 07/21/2016 0344   TRIG 58 07/21/2016 0344   HDL 46 07/21/2016 0344   CHOLHDL 3.7 07/21/2016 0344   VLDL 12 07/21/2016 0344   LDLCALC 114 (H) 07/21/2016 0344   HgbA1C  Lab Results  Component Value Date   HGBA1C 5.2 07/21/2016   Cardiac Panel (last 3 results) No results for input(s): CKTOTAL, CKMB, TROPONINI, RELINDX in the last 72 hours. Urinalysis    Component Value Date/Time   COLORURINE YELLOW 07/20/2016 2125   APPEARANCEUR CLEAR 07/20/2016 2125   LABSPEC 1.023 07/20/2016 2125   PHURINE 7.0 07/20/2016 2125   GLUCOSEU NEGATIVE 07/20/2016 2125   HGBUR NEGATIVE 07/20/2016 2125   BILIRUBINUR NEGATIVE 07/20/2016 2125   KETONESUR NEGATIVE 07/20/2016 2125   PROTEINUR 30 (A) 07/20/2016 2125   UROBILINOGEN 0.2 05/07/2014 1213   NITRITE NEGATIVE 07/20/2016 2125   LEUKOCYTESUR MODERATE (A) 07/20/2016 2125   Urine Drug Screen No results found for: LABOPIA, COCAINSCRNUR, LABBENZ, AMPHETMU, THCU, LABBARB  Alcohol Level No results found for: [place] I have personally reviewed the radiological images below and agree with the radiology  interpretations. Red text is my interpretation  Ct Head Code Stroke W/o Cm 07/20/2016 1. No evidence of large territory acute infarct, hemorrhage, or new focal mass effect.  2. New lucency in right thalamus is probably an interval chronic lacunar infarct.  3. ASPECTS is 10  4. Stable calcified dural-based mass in the left posterior fossa probably representing meningioma.  Ct Angio Head W Or Wo Contrast Ct Angio Neck W Or Wo Contrast 07/20/2016 1. Right vertebral artery origin calcified plaque with moderate 60% stenosis.  2. Otherwise no evidence of dissection, significant stenosis, or dissection of the carotid and vertebral arteries of the neck.  3. No significant stenosis, proximal occlusion, aneurysm, or vascular malformation of the circle of Willis.   MRI brain 07/21/2016 1. Punctate acute ischemia in the right mid brain and, possibly, within the right pons, without associated hemorrhage or mass effect. 2. Chronic microvascular ischemia.  However, By my interpretation, not convincing for acute ischemia.   Trans-thoracic Echocardiogram 07/21/2016 Study Conclusions - Left ventricle: The cavity size was normal. Wall thickness was   normal. Systolic function was normal. The estimated ejection   fraction was in the range of 55% to 60%. Wall motion was normal;   there were no regional wall motion abnormalities. - Mitral valve: There was mild regurgitation. - Left atrium: The atrium was mildly dilated. - Right atrium: The atrium was mildly dilated. - Atrial septum: No defect or patent foramen ovale was identified.   HISTORY OF PRESENT ILLNESS Tammy Lloyd is an 83 y.o. female with a prior history of TIA who presents with acute onset of dysphasia. She was at home cooking dinner when she had sudden onset of "trouble getting her words out". Her prior TIA had the same symptoms. She has a history of atrial fibrillation. When asked, she denies taking ASA, Plavix, Aggrenox or a blood thinner  (specifically stated no to dabigatran, Eliquis, Xarelto and Coumadin/warfarin). Given her aphasia, family was unsure if her responses are reliable and were asked to locate her medications list to ensure that she is not on an anticoagulant in order to make decision to consent or not consent to tPA. The patient is unable to make complex medical decisions in the context of her aphasia. Daughter arrived to ED and verified that patient was not taking an anticoagulant; previously was on a DOAC but stopped taking on her own as she thought it might be causing side effect of itching.  LKN 5:50 PM. NIHSS =  2. The patient was administered IV t-PA 1/11/12018 at 2008. She was admitted to the neuro ICU for further evaluation and treatment.  HOSPITAL COURSE [name] is a 83 y.o. female with history of a TIA, a previous stroke, pulmonary hypertension, idiopathic hemochromatosis, hypertension, history of breast cancer, history of arthritis and atrial fibrillation on xarelto in the past which she discontinued on her own as she thought it might be causing a side effect of itching presenting with dysphasia. She received IV t-PA 07/20/2016 at 2005.   TIA:  L brain TIA, likely embolic secondary to known atrial fibrillation, s/p IV tPA.  Resultant  deficit resolved  Code stroke CT no acute infarct. aspects 10  CTA head/neck R VA 60% stenosis, o/w ok  MRI - no acute infarct by my interpretation although report concerning for punctate right pontine/midbrain infarct  2D Echo - EF 55-60%. No source of cardiac emboli identified.  LDL 114  HgbA1c - 5.2  SCDs for VTE prophylaxis  Diet regular Room service appropriate? Yes; Fluid consistency: Thin  Xarelto (rivaroxaban) daily prior to admission (though had recently stopped for a while because she thought it made her itch, but she had restarted taking it per her and daughter), now on No antithrombotic in hospital given tPA administration. Xarelto resumed after  24-hour imaging was negative for post TPA hemorrhage.  Ongoing  aggressive stroke risk factor management  Therapy recommendations:   Home health PT and OT recommended.  Disposition:   Discharge to home (daughter lives with her, daughter is on methadone, plans are for daughter to manage pt's meds per other daughter at bedside)  Atrial Fibrillation  Home anticoagulation:  Xarelto (rivaroxaban) daily   Continue xarelto once 24h imaging post tPA neg for hemorrhage  Compliance with medication  Cardiology consulted for bradycardia and she will follow up with card in 4-6 weeks.    Hypertension  Stable              Resume home BP meds              Long BP goal normotensive  Hyperlipidemia  Home meds:  No statin prior to admission  LDL 114, goal < 70  Now on Pravachol 20 mg daily  Other Stroke Risk Factors  Advanced age  Other Active Problems  Acute renal failure, creatinine 1.2 -> 0.96    DISCHARGE EXAM Blood pressure (!) 124/38, pulse 67, temperature 98 F (36.7 C), temperature source Oral, resp. rate 18, height 5\' 7"  (1.702 m), weight 68.7 kg (151 lb 7.3 oz), SpO2 97 %. Pleasant elderly Caucasian lady not in distress. . Afebrile. Head is nontraumatic. Neck is supple without bruit.    Cardiac exam showed irregularly irregular heart rate and rhythm. Lungs are clear to auscultation. Distal pulses are well felt. Neurological Exam ;  Awake  Alert oriented x 3. Normal speech and language.eye movements full without nystagmus.fundi were not visualized. Vision acuity and fields appear normal. Hearing is normal. Palatal movements are normal. Face symmetric. Tongue midline. Normal strength, tone, reflexes and coordination. Normal sensation. Gait deferred. NIHSS 0  Discharge Diet   Diet regular Room service appropriate? Yes; Fluid consistency: Thin liquids  DISCHARGE PLAN  Disposition:  Discharge to home  Xarelto (rivaroxaban) daily for secondary stroke  prevention.  Ongoing risk factor control by Primary Care Physician at time of discharge  Follow-up [name], MD in 2 weeks.  Follow-up with Dr. Antony Contras, Stroke Clinic in 6 weeks, office to schedule an appointment.  Home health physical and occupational therapies  Follow-up with cardiology as instructed   Addendum - Just prior to discharge the patient noted some mild pain and erythema at the base of her right great toe. On exam there was mildly increased warmth. Even though she has never had gout in the past this may be an early case. She states that her husband had gout and she is very familiar with the disease. She does not feel that this is gout. She and her daughter were given the option of staying another day and starting medication for gout but they declined stating that they just wanted to get home. She was instructed to call her primary care physician if the situation became worse.  40 minutes were spent preparing discharge.  Rosalin Hawking, MD PhD Stroke Neurology 07/22/2016 6:04 PM

## 2016-07-22 NOTE — Care Management (Signed)
Called regarding bradycardia in the setting of the patient's atrial fibrillation with current HRs in the 40's dropping as low as 30's. Per literature review, although a-fib usually results in a tachyarrhytmia, it can occasionally present as bradyarrhytmia if there is hidden AV nodal or sinus nodal dysfunction. Amiodarone can lead to complications in this setting per review.   She was bradycardic yesterday as well but has been asymptomatic.   Neurology service is obtaining a routine Cardiology consult for management recommendations and caveats regarding medications to avoid.   Electronically signed: Dr. Kerney Elbe

## 2016-07-22 NOTE — Progress Notes (Signed)
Occupational Therapy Treatment Patient Details Name: Tammy Lloyd MRN: [id number] DOB: 10-06-1923 Today's Date: 07/22/2016    History of present illness Patient is a 83 y/o female with hx of CVA, pulmonary HTN, cataracts, cancer presents with difficulty getting words out. Head CT-New lucency in right thalamus is probably an interval chronic lacunar infarct. s/p tPA.   OT comments  Pt instructed in safe technique for tub transfer and safety issues at home.  Family will provide necessary supervision/assist at discharge.  RN alerted to pain/redness Rt foot.    Follow Up Recommendations  Home health OT;Supervision/Assistance - 24 hour;Other (comment)    Equipment Recommendations  None recommended by OT    Recommendations for Other Services      Precautions / Restrictions Precautions Precautions: Fall       Mobility Bed Mobility               General bed mobility comments: Pt sitting up in chair   Transfers Overall transfer level: Needs assistance Equipment used: Rolling walker (2 wheeled) Transfers: Sit to/from [facility] Sit to Stand: Min guard Stand pivot transfers: Min guard       General transfer comment: Pt initially requesting to use her SPC.  When she moved to standing, she immediately lost balance falling back into chair as she had nothing to stabilize with.  Pt then moved sit to stand with min A, but unable to safely ambulate with SPC.  Pt then used RW for remainder of session.   Instructed pt and family that she is ONLY to use RW     Balance Overall balance assessment: Needs assistance Sitting-balance support: Feet supported Sitting balance-Leahy Scale: Good     Standing balance support: Single extremity supported;During functional activity Standing balance-Leahy Scale: Poor Standing balance comment: requires UE support                    ADL Overall ADL's : Needs assistance/impaired                         Toilet  Transfer: Min guard;Ambulation;Comfort height toilet;RW   Toileting- Water quality scientist and Hygiene: Min guard;Sit to/from stand   Tub/ Shower Transfer: Min guard;Ambulation;Tub bench;Rolling walker Tub/Shower Transfer Details (indicate cue type and reason): Pt has tub transfer bench at home.  When practicing tub transfer, pt reveals she steps over the tub at home.   Instructed pt to sit on edge of bench and swing LEs into tub.  She was able to return demonstration.   Instructed pt daughter of safe transfer technique  Functional mobility during ADLs: Min guard;Rolling walker General ADL Comments: Instructed daughter and grand daughter to remove any area rugs in the home to reduce risk of falls.   Pt with complaint of Rt foot pain.  Sock removed.  Redness noted at 1st metatarsal head and mild edma noted when compared to Lt foot.  RN notified who contacted MD       Vision                     Perception     Praxis      Cognition   Behavior During Therapy: WFL for tasks assessed/performed Overall Cognitive Status: Within Functional Limits for tasks assessed                       Extremity/Trunk Assessment  Exercises     Shoulder Instructions       General Comments      Pertinent Vitals/ Pain       Pain Assessment: Faces Faces Pain Scale: Hurts little more Pain Location: Rt foot - head of 1st metatarsal head   Pain Descriptors / Indicators: Aching;Grimacing;Guarding Pain Intervention(s): Monitored during session;Repositioned;Other (comment) (RN notified )  Home Living                                          Prior Functioning/Environment              Frequency  Min 2X/week        Progress Toward Goals  OT Goals(current goals can now be found in the care plan section)  Progress towards OT goals: Progressing toward goals     Plan Discharge plan remains appropriate    Co-evaluation                  End of Session Equipment Utilized During Treatment: Rolling walker;Gait belt   Activity Tolerance Patient limited by pain   Patient Left in chair;with call bell/phone within reach;with family/visitor present   Nurse Communication Mobility status;Other (comment) (Pain Rt foot )        Time: [id number] OT Time Calculation (min): 18 min  Charges: OT General Charges $OT Visit: 1 Procedure OT Treatments $Self Care/Home Management : 8-22 mins  Ayub Kirsh M 07/22/2016, 3:22 PM

## 2016-07-22 NOTE — Progress Notes (Signed)
Patient ready for discharge to home; discharge instructions given and reviewed; Rx's sent electronically; daughter and granddaughter present for instructions; patient discharged via wheelchair; patient and daughter refusing HHC; will inform Case Management.

## 2016-07-24 ENCOUNTER — Telehealth: Payer: Self-pay | Admitting: Family Medicine

## 2016-07-24 NOTE — Telephone Encounter (Signed)
Called pt to discuss recent hospital stay. Pt states she is feeling better. Medications were gone over and she understands what she is now taking. She does have a medication that she will pick up at pharmacy today. Pt was provided with Dr. Leonie Man name, phone number and advise to call and make an appt. Pt stated she would. Pt made an appt for Thursday and was informed to bring all medications with her. Pt verified understanding. After hour protocol was discussed and pt was informed if she needed anything before her visit to call and I would try to help her. Pt verifed understanding.

## 2016-07-28 ENCOUNTER — Inpatient Hospital Stay: Payer: Medicare Other | Admitting: Family Medicine

## 2016-07-31 ENCOUNTER — Ambulatory Visit (INDEPENDENT_AMBULATORY_CARE_PROVIDER_SITE_OTHER): Payer: Medicare Other | Admitting: Family Medicine

## 2016-07-31 ENCOUNTER — Encounter: Payer: Self-pay | Admitting: Family Medicine

## 2016-07-31 VITALS — BP 140/60 | HR 75 | Wt 154.2 lb

## 2016-07-31 DIAGNOSIS — I63312 Cerebral infarction due to thrombosis of left middle cerebral artery: Secondary | ICD-10-CM | POA: Diagnosis not present

## 2016-07-31 DIAGNOSIS — I10 Essential (primary) hypertension: Secondary | ICD-10-CM | POA: Diagnosis not present

## 2016-07-31 DIAGNOSIS — I482 Chronic atrial fibrillation, unspecified: Secondary | ICD-10-CM

## 2016-07-31 NOTE — Progress Notes (Signed)
   Subjective:    Patient ID: KRISTINAMARIE LAB, female    DOB: [birth year], 83 y.o.   MRN: [id number]  HPI She is here for a follow-up visit after recent hospitalization for evaluation and treatment of CVA. Hasn't really she is having no residual neurologic weakness. She does not complain of weakness, dysarthria, numbness, tingling, headache. She was placed on Pravachol. She has an underlying history of atrial fibrillation. She did stop the Xarelto but has now been placed back on it. She is also taking losartan. She did have trouble with right great toe pain in the hospital however today does not complain of this. Review of the record indicates she has not had a flu shot and again is not interested in having it.   Review of Systems     Objective:   Physical Exam Alert and in no distress. Tympanic membranes and canals are normal. Pharyngeal area is normal. Neck is supple without adenopathy or thyromegaly. Cardiac exam shows a regular sinus rhythm without murmurs , intermittent S4 gallop heard.. Lungs are clear to auscultation.        Assessment & Plan:  Essential hypertension  Cerebrovascular accident (CVA) due to thrombosis of left middle cerebral artery (HCC)  Chronic atrial fibrillation ([place]) In general she is doing quite well. I will have her return here in 2 months for a recheck on her blood pressure as well as her lipid panel. Approximately one half hour spent discussing all these issues with her.

## 2016-08-01 ENCOUNTER — Ambulatory Visit (INDEPENDENT_AMBULATORY_CARE_PROVIDER_SITE_OTHER): Payer: Medicare Other | Admitting: Nurse Practitioner

## 2016-08-01 ENCOUNTER — Encounter: Payer: Self-pay | Admitting: Nurse Practitioner

## 2016-08-01 VITALS — BP 186/76 | HR 68 | Ht 67.0 in | Wt 155.0 lb

## 2016-08-01 DIAGNOSIS — I639 Cerebral infarction, unspecified: Secondary | ICD-10-CM

## 2016-08-01 DIAGNOSIS — E782 Mixed hyperlipidemia: Secondary | ICD-10-CM

## 2016-08-01 DIAGNOSIS — I482 Chronic atrial fibrillation, unspecified: Secondary | ICD-10-CM

## 2016-08-01 DIAGNOSIS — I10 Essential (primary) hypertension: Secondary | ICD-10-CM | POA: Diagnosis not present

## 2016-08-01 MED ORDER — HYDRALAZINE HCL 10 MG PO TABS: 10.0000 mg | ORAL_TABLET | Freq: Two times a day (BID) | ORAL | 11 refills | Status: DC

## 2016-08-01 NOTE — Progress Notes (Signed)
Office Visit    Patient Name: Tammy Lloyd Date of Encounter: 08/01/2016  Primary Care Provider:  [name], MD Primary Cardiologist:  [name], MD   Chief Complaint    83 y/o ? with a h/o persistent afib, nonobs CAD, TIA, HTN, and recent CVA, who presents for f/u.  Past Medical History    Past Medical History:  Diagnosis Date  . Arthritis   . Cancer ([place])    Breast  . Cataract   . Colon polyp   . Edema of both legs   . Hemochromatosis    Followed by  hematology;  . Hemorrhoid   . Hypertension   . Idiopathic hemochromatosis ([place]) 09/28/2011  . Lichen sclerosus   . Non-obstructive CAD (coronary artery disease)    a. 10/2011 Cath [facility]): LM nl, LAD 5/10ost, 10/15p, D1 nl, LCX 10/15ost, OM1/2 small, OM3 nl, RCA large/nl, RPDA/RPLV nl, EF 55-60%.  . Osteoporosis 2005   DEXA 2015 T score -1.7 stable from prior DEXA 2011 history of Fosamax 8 years. On drug-free holiday now  . Persistent atrial fibrillation (HCC)    a. CHA2DS2VASc = 5 -->Xarelto;  b. 07/2016 Echo: EF 55-60%, no rwma, mild MR, mild biatrial enlargement.  . Pulmonary hypertension   . Stroke [facility]    a. 07/2016 MRI: punctate acute ischemia in the R mid brain and possibly within the right pons w/o assoc hemorrhage or mass effect. Chronic microvascular ischemia.   Past Surgical History:  Procedure Laterality Date  . BACK SURGERY  2008  . BREAST LUMPECTOMY     Right  . CATARACT EXTRACTION, BILATERAL    . COLONOSCOPY  2009  . DILATION AND CURETTAGE OF UTERUS  2005  . HYSTEROSCOPY  08/2003  . LEFT HEART CATHETERIZATION WITH CORONARY ANGIOGRAM N/A 10/24/2011   Procedure: LEFT HEART CATHETERIZATION WITH CORONARY ANGIOGRAM;  Surgeon: Clent Demark, MD;  Location: [facility] CATH LAB;  Service: Cardiovascular;  Laterality: N/A;  . TOTAL KNEE ARTHROPLASTY  2006    Allergies  Allergies  Allergen Reactions  . Tape Other (See Comments)    Skin is thin and may tear or bruise easily    History of Present  Illness    83 y/o ? with a h/o HTN, persistent AFib, TIA, HTN, and nonobstructive CAD.  Afib was previously managed by PCP and she had been on oral anticoagulation but came off of xarelto, she believes about 2 yrs ago, b/c she was having some itching, that she thought might have been attributable to [place].  The itching did not go away, however she never resumed xarelto.  She was admitted to [facility] earlier this month with aphasia and was dx with acute R mid brain CVA.  She was in afib @ the time and xarelto was resumed.  Echo showed nl EF.  She was seen by Dr. [name] during admission due to intermittent bradycardia with pauses up to 3.1 seconds.  Norvasc was d/c'd and she was continued on losartan-hctz.  Since d/c, she has done well.  She hasn't really had any residual deficits r/t her stroke.  BP has been running in the 170's on her home monitor but was 140/60 @ PCP office visit yesterday.  Today, she is 186/76.  She denies chest pain, palpitations, dyspnea, pnd, orthopnea, n, v, dizziness, syncope, edema, weight gain, or early satiety.   Home Medications    Prior to Admission medications   Medication Sig Start Date End Date Taking? Authorizing Provider  acetaminophen (TYLENOL) 325  MG tablet Take 650 mg by mouth every 6 (six) hours as needed for headache (or pain). Reported on 10/14/2015   Yes Historical Provider, MD  clobetasol cream (TEMOVATE) AB-123456789 % Apply 1 application topically 2 (two) times daily as needed (for itching). Reported on 10/14/2015   Yes Historical Provider, MD  losartan-hydrochlorothiazide (HYZAAR) 100-25 MG tablet Take 1 tablet by mouth daily.   Yes Historical Provider, MD  Multiple Vitamins-Minerals (PRESERVISION AREDS PO) Take 1 capsule by mouth 2 (two) times daily.    Yes Historical Provider, MD  pravastatin (PRAVACHOL) 20 MG tablet Take 1 tablet (20 mg total) by mouth at bedtime. 07/22/16  Yes [name], PA-C  Prednicarbate 0.1 % CREA Apply 1 application topically 2 (two)  times daily as needed. For itching 05/05/16  Yes Anastasio Auerbach, MD  Rivaroxaban (XARELTO) 15 MG TABS tablet Take 1 tablet (15 mg total) by mouth daily with supper. 07/22/16  Yes [name], PA-C  hydrALAZINE (APRESOLINE) 10 MG tablet Take 1 tablet (10 mg total) by mouth 2 (two) times daily. 08/01/16   Rogelia Mire, NP    Review of Systems    Doing well. She denies chest pain, palpitations, dyspnea, pnd, orthopnea, n, v, dizziness, syncope, edema, weight gain, or early satiety.  All other systems reviewed and are otherwise negative except as noted above.  Physical Exam    VS:  BP (!) 186/76   Pulse 68   Ht 5\' 7"  (1.702 m)   Wt 155 lb (70.3 kg)   SpO2 97%   BMI 24.28 kg/m  , BMI Body mass index is 24.28 kg/m. GEN: Well nourished, well developed, in no acute distress.  HEENT: normal.  Neck: Supple, no JVD, carotid bruits, or masses. Cardiac: IR, IR, no murmurs, rubs, or gallops. No clubbing, cyanosis, edema.  Radials/DP/PT 2+ and equal bilaterally.  Respiratory:  Respirations regular and unlabored, clear to auscultation bilaterally. GI: Soft, nontender, nondistended, BS + x 4. MS: no deformity or atrophy. Skin: warm and dry, no rash. Neuro:  Strength and sensation are intact. Psych: Normal affect.  Accessory Clinical Findings    ECG - afib, 60, prior septal infarct.  Assessment & Plan    1.  Persistent Atrial Fibrillation:  Pt has a h/o AFib dating back at least to 09/2013.  She was prev anticoagulated and was on xarelto but stopped a few yrs ago due to concerns over itching, which persisted despite stopping it.  She was recently admitted with CVA and found to be in Afib (rate-controlled).  Xarelto was resumed.  She remains in rate controlled afib.  She is not on avn blocking agents due to relative baseline bradycardia with pauses up to 3.1 seconds during hospitalization.  I will arrange for f/u cbc/bmet given new xarelto start.  CHA2DS2VASc = 6.  2.  Essential HTN:   bp has been elevated @ home in the 170's.  Today she is 186/76 and 188/90 on repeat.  She is on losartan-hctz 100-25.  She was prev on amlodipine but this was recently d/c'd 2/2 concern for bradycardia and any potential role that it might be playing.  I will add hydralazine 10 mg bid and arrange for f/u with our pharmacist in 1 wk for bp check and also to compare her bp cuff vs our measurements in the office.  I'm starting with BID in order to simplify the dosing for this elderly female but recognize that if systolic still > [id number], we will likely have to  titrate 10 or 25 TID.  3.  CVA:  Recent CVA in the setting of AFib.  No residual deficits.  She has f/u with neurology in a few wks.  4.  HL:  LDL 114 on 1/12.  On pravachol.  5.  Dispo:  F/u with pharmacist in 1 wk to reassess bp.  Will arrange for f/u cbc/bmet @ that time.   Murray Hodgkins, NP 08/01/2016, 2:19 PM

## 2016-08-01 NOTE — Patient Instructions (Signed)
Medication Instructions:  Your physician has recommended you make the following change in your medication:  1.  START the Hydralazine 10 mg taking 1 tablet twice a day   Labwork: 1 WEEK AT TIME PT SEES PHARM-D:  BMET & CBC  Testing/Procedures: None ordered  Follow-Up: Your physician recommends that you schedule a follow-up appointment in: [facility] FOR BLOOD PRESSURE RECHECK  Your physician has requested that you regularly monitor and record your blood pressure readings at home. Please use the same machine at the same time of day to check your readings and record them to bring to your follow-up visit.   Any Other Special Instructions Will Be Listed Below (If Applicable).  If you need a refill on your cardiac medications before your next appointment, please call your pharmacy.

## 2016-08-04 ENCOUNTER — Telehealth: Payer: Self-pay

## 2016-08-04 NOTE — Telephone Encounter (Signed)
Her meds were readjusted by cardiology on the 23rd. Have her follow up with them

## 2016-08-04 NOTE — Telephone Encounter (Signed)
LMTCB

## 2016-08-04 NOTE — Telephone Encounter (Signed)
Pt called the office to let us know her BP was 191/82 at 8am, pt questions if she needs to start back on her BP meds that were d/c on Monday.    [phone]

## 2016-08-08 ENCOUNTER — Ambulatory Visit (INDEPENDENT_AMBULATORY_CARE_PROVIDER_SITE_OTHER): Payer: Medicare Other | Admitting: Pharmacist

## 2016-08-08 ENCOUNTER — Other Ambulatory Visit: Payer: Self-pay | Admitting: Nurse Practitioner

## 2016-08-08 VITALS — BP 144/63 | HR 62

## 2016-08-08 DIAGNOSIS — I10 Essential (primary) hypertension: Secondary | ICD-10-CM | POA: Diagnosis not present

## 2016-08-08 DIAGNOSIS — I639 Cerebral infarction, unspecified: Secondary | ICD-10-CM | POA: Diagnosis not present

## 2016-08-08 DIAGNOSIS — I482 Chronic atrial fibrillation: Secondary | ICD-10-CM | POA: Diagnosis not present

## 2016-08-08 LAB — BASIC METABOLIC PANEL
BUN: 28 mg/dL — ABNORMAL HIGH (ref 7–25)
CO2: 25 mmol/L (ref 20–31)
Calcium: 9.1 mg/dL (ref 8.6–10.4)
Chloride: 103 mmol/L (ref 98–110)
Creat: 1.07 mg/dL — ABNORMAL HIGH (ref 0.60–0.88)
GLUCOSE: 102 mg/dL — AB (ref 65–99)
POTASSIUM: 4.7 mmol/L (ref 3.5–5.3)
SODIUM: 137 mmol/L (ref 135–146)

## 2016-08-08 LAB — CBC
HCT: 36.4 % (ref 35.0–45.0)
HEMOGLOBIN: 12 g/dL (ref 11.7–15.5)
MCH: 32.2 pg (ref 27.0–33.0)
MCHC: 33 g/dL (ref 32.0–36.0)
MCV: 97.6 fL (ref 80.0–100.0)
MPV: 10.8 fL (ref 7.5–12.5)
Platelets: 419 10*3/uL — ABNORMAL HIGH (ref 140–400)
RBC: 3.73 MIL/uL — AB (ref 3.80–5.10)
RDW: 13.4 % (ref 11.0–15.0)
WBC: 9.3 10*3/uL (ref 3.8–10.8)

## 2016-08-08 MED ORDER — HYDRALAZINE HCL 10 MG PO TABS: 10.0000 mg | ORAL_TABLET | Freq: Three times a day (TID) | ORAL | 2 refills | Status: DC

## 2016-08-08 NOTE — Progress Notes (Signed)
Patient ID: Tammy Lloyd                 DOB: 05/16/1924                      MRN: [id number]     HPI: Tammy Lloyd is a 83 y.o. female referred by Angelica Ran NP to HTN clinic.  PMH includes CAD, TIA, HTN, and recent CVA.  Patient recently discharged from hospital after TIA on 07/20/16. Amlodipine 10mg  was discontinued due to bradycardia. Hydralazine 10mg  twice daily initiated ion 08/01/16 by C Brege and patient report doing well with new medication.  Denied increased fatigue, rash, itching, dizziness or low Bp.     Current HTN meds:  losartan/HCTZ 100-25 po BID  Hydralazine 10mg  po BID  Previously tried:  Amlodipine 10mg  daily  BP goal: < 140/90  Diet: low in salt, does not add salt to cooked food  Exercise: some walking , just out of the hospital not a lot of physical activity  Home BP readings: 7 readings; 198/76 average.  ReliOn home device calibrated in office is within 55mmHg difference . Noted technique using home cuff was incorrect and may be given elevated BP readings.  Wt Readings from Last 3 Encounters:  08/01/16 155 lb (70.3 kg)  07/31/16 154 lb 3.2 oz (69.9 kg)  07/21/16 151 lb 7.3 oz (68.7 kg)   BP Readings from Last 3 Encounters:  08/01/16 (!) 186/76  07/31/16 140/60  07/22/16 (!) 124/38   Pulse Readings from Last 3 Encounters:  08/01/16 68  07/31/16 75  07/22/16 67    Renal function: Estimated Creatinine Clearance: 37.9 mL/min (by C-G formula based on SCr of 0.96 mg/dL).  Past Medical History:  Diagnosis Date  . Arthritis   . Cancer ([place])    Breast  . Cataract   . Colon polyp   . Edema of both legs   . Hemochromatosis    Followed by  hematology;  . Hemorrhoid   . Hypertension   . Idiopathic hemochromatosis ([place]) 09/28/2011  . Lichen sclerosus   . Non-obstructive CAD (coronary artery disease)    a. 10/2011 Cath [facility]): LM nl, LAD 5/10ost, 10/15p, D1 nl, LCX 10/15ost, OM1/2 small, OM3 nl, RCA large/nl, RPDA/RPLV nl, EF 55-60%.  . Osteoporosis 2005   DEXA 2015 T score -1.7 stable from prior DEXA 2011 history of Fosamax 8 years. On drug-free holiday now  . Persistent atrial fibrillation (HCC)    a. CHA2DS2VASc = 5 -->Xarelto;  b. 07/2016 Echo: EF 55-60%, no rwma, mild MR, mild biatrial enlargement.  . Pulmonary hypertension   . Stroke [facility])    a. 07/2016 MRI: punctate acute ischemia in the R mid brain and possibly within the right pons w/o assoc hemorrhage or mass effect. Chronic microvascular ischemia.    Current Outpatient Prescriptions on File Prior to Visit  Medication Sig Dispense Refill  . acetaminophen (TYLENOL) 325 MG tablet Take 650 mg by mouth every 6 (six) hours as needed for headache (or pain). Reported on 10/14/2015    . clobetasol cream (TEMOVATE) AB-123456789 % Apply 1 application topically 2 (two) times daily as needed (for itching). Reported on 10/14/2015    . hydrALAZINE (APRESOLINE) 10 MG tablet Take 1 tablet (10 mg total) by mouth 2 (two) times daily. 60 tablet 11  . losartan-hydrochlorothiazide (HYZAAR) 100-25 MG tablet Take 1 tablet by mouth daily.    . Multiple Vitamins-Minerals (PRESERVISION AREDS PO) Take 1 capsule by mouth 2 (  two) times daily.     . pravastatin (PRAVACHOL) 20 MG tablet Take 1 tablet (20 mg total) by mouth at bedtime. 30 tablet 6  . Prednicarbate 0.1 % CREA Apply 1 application topically 2 (two) times daily as needed. For itching 60 g 1  . Rivaroxaban (XARELTO) 15 MG TABS tablet Take 1 tablet (15 mg total) by mouth daily with supper. 30 tablet 6   No current facility-administered medications on file prior to visit.     Allergies  Allergen Reactions  . Tape Other (See Comments)    Skin is thin and may tear or bruise easily     Assessment/Plan:  Hypertension - Blood pressure today of 144/62 is significantly improved from previous readings but remain above goal of <140/90.  Patient recovering well from recent TIA. Home BP device calibrated and found to be within acceptable range. Noted patient was using  wrong technique that may account for some of the extremely elevated numbers obtained at home.    BMET ordered on 08/01/16 but patient will complete today.  Will increase hydralazine 10mg  from BID to TID.  Patient to keep records of home BP readings and follow up with HTN clinic in 2 weeks.  Rudolph Dobler Rodriguez-Guzman PharmD, [place] [id number] 08/08/2016 10:06 PM

## 2016-08-08 NOTE — Patient Instructions (Addendum)
Return for a follow up appointment in 2 weeks  Your blood pressure today is 144/62 pulse 62  Check your blood pressure at home daily (if able) and keep record of the readings.  Take your BP meds as follows: Losartan/HCTZ 100/25mg  twice daily Hydralazine 10mg  Three times daily ( morning, noon, and bedtime)   Bring all of your meds, your BP cuff and your record of home blood pressures to your next appointment.  Exercise as you're able, try to walk approximately 30 minutes per day.  Keep salt intake to a minimum, especially watch canned and prepared boxed foods.  Eat more fresh fruits and vegetables and fewer canned items.  Avoid eating in fast food restaurants.    HOW TO TAKE YOUR BLOOD PRESSURE: . Rest 5 minutes before taking your blood pressure. .  Don't smoke or drink caffeinated beverages for at least 30 minutes before. . Take your blood pressure before (not after) you eat. . Sit comfortably with your back supported and both feet on the floor (don't cross your legs). . Elevate your arm to heart level on a table or a desk. . Use the proper sized cuff. It should fit smoothly and snugly around your bare upper arm. There should be enough room to slip a fingertip under the cuff. The bottom edge of the cuff should be 1 inch above the crease of the elbow. . Ideally, take 3 measurements at one sitting and record the average.

## 2016-08-14 ENCOUNTER — Encounter: Payer: Self-pay | Admitting: Pharmacist

## 2016-08-22 ENCOUNTER — Ambulatory Visit (INDEPENDENT_AMBULATORY_CARE_PROVIDER_SITE_OTHER): Payer: Medicare Other | Admitting: Pharmacist

## 2016-08-22 VITALS — BP 142/64 | HR 58

## 2016-08-22 DIAGNOSIS — I10 Essential (primary) hypertension: Secondary | ICD-10-CM

## 2016-08-22 DIAGNOSIS — I639 Cerebral infarction, unspecified: Secondary | ICD-10-CM | POA: Diagnosis not present

## 2016-08-22 MED ORDER — HYDRALAZINE HCL 10 MG PO TABS: 10.0000 mg | ORAL_TABLET | Freq: Two times a day (BID) | ORAL | 2 refills | Status: DC

## 2016-08-22 NOTE — Patient Instructions (Addendum)
Return for a  follow up appointment as needed (will follow up with Primary Care)  Your blood pressure today is 142/64 pulse 58  Check your blood pressure at home daily (if able) and keep record of the readings.  Take Blood pressure medication as follows:  losartan/HCTZ 100-25 daily **Change Hydralazine 10mg  to twice daily** **START amlodipine 5mg  daily (okay to use 1/2 tablet of 10mg  for now)**   Bring all of your meds, your BP cuff and your record of home blood pressures to your next appointment.  Exercise as you're able, try to walk approximately 30 minutes per day.  Keep salt intake to a minimum, especially watch canned and prepared boxed foods.  Eat more fresh fruits and vegetables and fewer canned items.  Avoid eating in fast food restaurants.    HOW TO TAKE YOUR BLOOD PRESSURE: . Rest 5 minutes before taking your blood pressure. .  Don't smoke or drink caffeinated beverages for at least 30 minutes before. . Take your blood pressure before (not after) you eat. . Sit comfortably with your back supported and both feet on the floor (don't cross your legs). . Elevate your arm to heart level on a table or a desk. . Use the proper sized cuff. It should fit smoothly and snugly around your bare upper arm. There should be enough room to slip a fingertip under the cuff. The bottom edge of the cuff should be 1 inch above the crease of the elbow. . Ideally, take 3 measurements at one sitting and record the average.

## 2016-08-22 NOTE — Progress Notes (Signed)
Patient ID: Tammy DAISEY                 DOB: 1924-06-29                      MRN: [id number]     HPI:  Tammy Lloyd is a 83 Lloyd.o. female patient of Dr Tammy Lloyd referred by Tammy Ran NP to HTN clinic.  PMH includes CAD, TIA, HTN, and recent CVA.  Patient recently discharged from hospital after TIA on 07/20/16. Hydralazine 10mg  was increased to TID during the last OV in 07/29/16.  Patient reports doing well with current therapy but complaining of having to take hydralzine three times daily.  Denied increased fatigue, rash, itching, dizziness or episodes low BP at home.    Current HTN meds:  losartan/HCTZ 100-25 po every moning  Hydralazine 10mg  po three time daily  Previously tried:  Amlodipine 10mg  daily - bradycardia  BP goal: < 140/90  Diet: low in salt, does not add salt to cooked food  Exercise: some walking , just out of the hospital not a lot of physical activity  Home BP readings: 10 reading; 169/68 average ; pulse 70s ReliOn home device calibrated in office is within 69mmHg difference .   Wt Readings from Last 3 Encounters:  08/01/16 155 lb (70.3 kg)  07/31/16 154 lb 3.2 oz (69.9 kg)  07/21/16 151 lb 7.3 oz (68.7 kg)   BP Readings from Last 3 Encounters:  08/22/16 (!) 142/64  08/08/16 (!) 144/63  08/01/16 (!) 186/76   Pulse Readings from Last 3 Encounters:  08/22/16 (!) 58  08/08/16 62  08/01/16 68    Past Medical History:  Diagnosis Date  . Arthritis   . Cancer ([place])    Breast  . Cataract   . Colon polyp   . Edema of both legs   . Hemochromatosis    Followed by  hematology;  . Hemorrhoid   . Hypertension   . Idiopathic hemochromatosis ([employer]) 09/28/2011  . Lichen sclerosus   . Non-obstructive CAD (coronary artery disease)    a. 10/2011 Cath [facility]): LM nl, LAD 5/10ost, 10/15p, D1 nl, LCX 10/15ost, OM1/2 small, OM3 nl, RCA large/nl, RPDA/RPLV nl, EF 55-60%.  . Osteoporosis 2005   DEXA 2015 T score -1.7 stable from prior DEXA 2011 history of Fosamax 8 years.  On drug-free holiday now  . Persistent atrial fibrillation (HCC)    a. CHA2DS2VASc = 5 -->Xarelto;  b. 07/2016 Echo: EF 55-60%, no rwma, mild MR, mild biatrial enlargement.  . Pulmonary hypertension   . Stroke [facility])    a. 07/2016 MRI: punctate acute ischemia in the R mid brain and possibly within the right pons w/o assoc hemorrhage or mass effect. Chronic microvascular ischemia.    Current Outpatient Prescriptions on File Prior to Visit  Medication Sig Dispense Refill  . acetaminophen (TYLENOL) 325 MG tablet Take 650 mg by mouth every 6 (six) hours as needed for headache (or pain). Reported on 10/14/2015    . clobetasol cream (TEMOVATE) AB-123456789 % Apply 1 application topically 2 (two) times daily as needed (for itching). Reported on 10/14/2015    . hydrALAZINE (APRESOLINE) 10 MG tablet Take 1 tablet (10 mg total) by mouth 3 (three) times daily. 90 tablet 2  . losartan-hydrochlorothiazide (HYZAAR) 100-25 MG tablet Take 1 tablet by mouth daily.    . Multiple Vitamins-Minerals (PRESERVISION AREDS PO) Take 1 capsule by mouth 2 (two) times daily.     Marland Kitchen  pravastatin (PRAVACHOL) 20 MG tablet Take 1 tablet (20 mg total) by mouth at bedtime. 30 tablet 6  . Prednicarbate 0.1 % CREA Apply 1 application topically 2 (two) times daily as needed. For itching 60 g 1  . Rivaroxaban (XARELTO) 15 MG TABS tablet Take 1 tablet (15 mg total) by mouth daily with supper. 30 tablet 6   No current facility-administered medications on file prior to visit.     Allergies  Allergen Reactions  . Tape Other (See Comments)    Skin is thin and may tear or bruise easily    Blood pressure (!) 142/64, pulse (!) 58, SpO2 97 %.  Essential hypertension:  BP remains elevated during office visit and at home. Patient also requesting a decrease in medication frequency and expressed preference for PCP to adjust her BP medication in the future.  Will add amlodipine 5mg  daily today and decrease frequency of hydralazine 10 mg back to twice  daily.  Patient will continue to monitor BP at home and keep records of reading to provide to PCP during follow up appointment (Next PCP follow up in 4 weeks). Patient encouraged to call cardiology clinic if questions or additional problems noted.   Tammy Lloyd PharmD, [place] [id number] 08/22/2016 11:29 AM

## 2016-08-24 DIAGNOSIS — H353132 Nonexudative age-related macular degeneration, bilateral, intermediate dry stage: Secondary | ICD-10-CM | POA: Diagnosis not present

## 2016-08-24 DIAGNOSIS — Z961 Presence of intraocular lens: Secondary | ICD-10-CM | POA: Diagnosis not present

## 2016-09-06 ENCOUNTER — Encounter: Payer: Self-pay | Admitting: Neurology

## 2016-09-06 ENCOUNTER — Ambulatory Visit (INDEPENDENT_AMBULATORY_CARE_PROVIDER_SITE_OTHER): Payer: Medicare Other | Admitting: Neurology

## 2016-09-06 VITALS — BP 145/65 | HR 57 | Ht 65.0 in | Wt 153.6 lb

## 2016-09-06 DIAGNOSIS — I749 Embolism and thrombosis of unspecified artery: Secondary | ICD-10-CM

## 2016-09-06 DIAGNOSIS — G459 Transient cerebral ischemic attack, unspecified: Secondary | ICD-10-CM | POA: Diagnosis not present

## 2016-09-06 DIAGNOSIS — I639 Cerebral infarction, unspecified: Secondary | ICD-10-CM | POA: Diagnosis not present

## 2016-09-06 NOTE — Progress Notes (Signed)
[facility] [address] [place]. [place] [address] [phone]       OFFICE FOLLOW-UP NOTE  [name] Date of Birth:  13-Aug-1923 Medical Record Number:  [id number]   HPI: Tammy Lloyd is a pleasant 83 year old Caucasian lady seen today for first office follow-up visit following admission for TIA in January 2018. She is accompanied by her daughter and history is obtain from both of them as well as review of hospital records and have personally reviewed imaging films. Tammy Lloyd an 83 y.o.femalewith a prior history of TIA who presents with acute onset of dysphasia. She was at home cooking dinner when she had sudden onset of "trouble getting her words out". Her prior TIA had the same symptoms. She has a history of atrial fibrillation. When asked, she denies taking ASA, Plavix, Aggrenox or a blood thinner (specifically stated no to dabigatran, Eliquis, Xarelto and Coumadin/warfarin). Given her aphasia, family wasunsure if her responses are reliable and were asked to locate her medications list to ensure that she is not on an anticoagulant in order to make decision to consent or not consent to tPA. The patient is unable to make complex medical decisions in the context of her aphasia. Daughter arrived to ED and verified that patient was not taking an anticoagulant; previously was on a DOAC but stopped taking on her own as she thought it might be causing side effect of itching.  LKN 5:50 PM. NIHSS = 2.The patient was administered IV t-PA 1/11/12018 at 2008. Shewas admitted to the neuro ICU for further evaluation and treatment. Patient's showed excellent clinical recovery. MRI scan in fact did not show an acute infarct explaining her symptoms but did show tiny infarcts in the right midbrain and possibly right pons which were felt to be embolic but not causing her clinical symptoms.. Echocardiogram showed normal ejection fraction. LDL cholesterol was elevated at 114 mg percent.  Hemoglobin A1c was 5.2. CT angiogram of the head and neck were significant only for 60% right vertebral stenosis which was asymptomatic. Patient had known history of atrial fibrillation but had been only on aspirin. She was switched to Xarelto for secondary stroke prevention discharge home. Patient has done well since discharge. She is tolerating Xarelto well without bleeding or bruising. She states her blood pressure is well controlled at home but today it is slightly elevated at 145/6 fine office. She is using a cane mostly and uses a walker for long distances. She is not no recent falls or injuries. She is bothered by her knee causing a lot of pain but she does not want knee replacement surgery at her age.  ROS:   14 system review of systems is positive for itching, insomnia and all other systems negative  PMH:  Past Medical History:  Diagnosis Date  . Arthritis   . Cancer ([place])    Breast  . Cataract   . Colon polyp   . Edema of both legs   . Hemochromatosis    Followed by  hematology;  . Hemorrhoid   . Hypertension   . Idiopathic hemochromatosis ([place]) 09/28/2011  . Lichen sclerosus   . Non-obstructive CAD (coronary artery disease)    a. 10/2011 Cath [facility]): LM nl, LAD 5/10ost, 10/15p, D1 nl, LCX 10/15ost, OM1/2 small, OM3 nl, RCA large/nl, RPDA/RPLV nl, EF 55-60%.  . Osteoporosis 2005   DEXA 2015 T score -1.7 stable from prior DEXA 2011 history of Fosamax 8 years. On drug-free holiday now  . Persistent atrial  fibrillation ([place])    a. CHA2DS2VASc = 5 -->Xarelto;  b. 07/2016 Echo: EF 55-60%, no rwma, mild MR, mild biatrial enlargement.  . Pulmonary hypertension   . Stroke [facility])    a. 07/2016 MRI: punctate acute ischemia in the R mid brain and possibly within the right pons w/o assoc hemorrhage or mass effect. Chronic microvascular ischemia.    Social History:  Social History   Social History  . Marital status: Married    Spouse name: N/A  . Number of children: N/A  . Years of  education: N/A   Occupational History  . Not on file.   Social History Main Topics  . Smoking status: Never Smoker  . Smokeless tobacco: Never Used  . Alcohol use No  . Drug use: No  . Sexual activity: No     Comment: 1st intercourse 83 yo-Fewer than 5 partners   Other Topics Concern  . Not on file   Social History Narrative   Lives alone.  Granddaughter is currently living with her. No pets    Medications:   Current Outpatient Prescriptions on File Prior to Visit  Medication Sig Dispense Refill  . acetaminophen (TYLENOL) 325 MG tablet Take 650 mg by mouth every 6 (six) hours as needed for headache (or pain). Reported on 10/14/2015    . amLODipine (NORVASC) 10 MG tablet Take 5 mg by mouth daily. Take 1/2 tablet daily    . clobetasol cream (TEMOVATE) [id number] % Apply 1 application topically 2 (two) times daily as needed (for itching). Reported on 10/14/2015    . hydrALAZINE (APRESOLINE) 10 MG tablet Take 1 tablet (10 mg total) by mouth 2 (two) times daily. 90 tablet 2  . losartan-hydrochlorothiazide (HYZAAR) 100-25 MG tablet Take 1 tablet by mouth daily.    . Multiple Vitamins-Minerals (PRESERVISION AREDS PO) Take 1 capsule by mouth 2 (two) times daily.     . pravastatin (PRAVACHOL) 20 MG tablet Take 1 tablet (20 mg total) by mouth at bedtime. 30 tablet 6  . Prednicarbate 0.1 % CREA Apply 1 application topically 2 (two) times daily as needed. For itching 60 g 1  . Rivaroxaban (XARELTO) 15 MG TABS tablet Take 1 tablet (15 mg total) by mouth daily with supper. 30 tablet 6   No current facility-administered medications on file prior to visit.     Allergies:   Allergies  Allergen Reactions  . Tape Other (See Comments)    Skin is thin and may tear or bruise easily    Physical Exam General: Frail elderly Caucasian lady, seated, in no evident distress Head: head normocephalic and atraumatic.  Neck: supple with no carotid or supraclavicular bruits Cardiovascular: regular rate and  rhythm, no murmurs Musculoskeletal: no deformity Skin:  no rash/petichiae Vascular:  Normal pulses all extremities Vitals:   09/06/16 1317  BP: (!) 145/65  Pulse: (!) 57   Neurologic Exam Mental Status: Awake and fully alert. Oriented to place and time. Recent and remote memory intact. Attention span, concentration and fund of knowledge appropriate. Mood and affect appropriate.  Cranial Nerves: Fundoscopic exam reveals sharp disc margins. Pupils equal, briskly reactive to light. Extraocular movements full without nystagmus. Visual fields full to confrontation. Hearing Significantly reduced bilaterally.. Facial sensation intact. Face, tongue, palate moves normally and symmetrically.  Motor: Normal bulk and tone. Normal strength in all tested extremity muscles. Sensory.: intact to touch ,pinprick .position and vibratory sensation.  Coordination: Rapid alternating movements normal in all extremities. Finger-to-nose and heel-to-shin performed accurately bilaterally. Gait and  Station: Arises from chair without difficulty. Stance is normal. Uses a cane for balance. Gait demonstrates normal stride length and balance . Unable to heel, toe and tandem walk without difficulty.  Reflexes: 1+ and symmetric. Toes downgoing.   NIHSS  0 Modified [place]     ASSESSMENT: 71 year Caucasian lady with transient expressive aphasia episode in January 2018 secondary to cardioembolic left hemispheric TIA from atrial fibrillation treated with IV TPA with excellent clinical recovery. Vascular risk factors of atrial fibrillation, hypertension, hyperlipidemia and age and sex.     PLAN: I had a long d/w patient and her daughter about her recent TIA, atrial fibrillation, benefit and risk of anticoagulation, risk for recurrent stroke/TIAs, personally independently reviewed imaging studies and stroke evaluation results and answered questions.Continue Xarelto (rivaroxaban) daily  for secondary stroke prevention and maintain  strict control of hypertension with blood pressure goal below 130/90, diabetes with hemoglobin A1c goal below 6.5% and lipids with LDL cholesterol goal below 70 mg/dL. I also advised the patient to use a cane or walker at all times to avoid falls and injuries I also advised the patient to eat a healthy diet with plenty of whole grains, cereals, fruits and vegetables, exercise regularly and maintain ideal body weight Followup in the future with my nurse practitioner in 6 months or call earlier if necessary. Greater than 50% of time during this 25 minute visit was spent on counseling,explanation of diagnosis, planning of further management, discussion with patient and family and coordination of care Antony Contras, MD  [facility] [address] [place], [place] [address]  Phone [phone] Fax [phone] Note: This document was prepared with digital dictation and possible smart phrase technology. Any transcriptional errors that result from this process are unintentional

## 2016-09-06 NOTE — Patient Instructions (Signed)
I had a long d/w patient and her daughter about her recent TIA, atrial fibrillation, benefit and risk of anticoagulation, risk for recurrent stroke/TIAs, personally independently reviewed imaging studies and stroke evaluation results and answered questions.Continue Xarelto (rivaroxaban) daily  for secondary stroke prevention and maintain strict control of hypertension with blood pressure goal below 130/90, diabetes with hemoglobin A1c goal below 6.5% and lipids with LDL cholesterol goal below 70 mg/dL. I also advised the patient to use a cane or walker at all times to avoid falls and injuries I also advised the patient to eat a healthy diet with plenty of whole grains, cereals, fruits and vegetables, exercise regularly and maintain ideal body weight Followup in the future with my nurse practitioner in 6 months or call earlier if necessary. Stroke Prevention Some medical conditions and behaviors are associated with an increased chance of having a stroke. You may prevent a stroke by making healthy choices and managing medical conditions. How can I reduce my risk of having a stroke?  Stay physically active. Get at least 30 minutes of activity on most or all days.  Do not smoke. It may also be helpful to avoid exposure to secondhand smoke.  Limit alcohol use. Moderate alcohol use is considered to be:  No more than 2 drinks per day for men.  No more than 1 drink per day for nonpregnant women.  Eat healthy foods. This involves:  Eating 5 or more servings of fruits and vegetables a day.  Making dietary changes that address high blood pressure (hypertension), high cholesterol, diabetes, or obesity.  Manage your cholesterol levels.  Making food choices that are high in fiber and low in saturated fat, trans fat, and cholesterol may control cholesterol levels.  Take any prescribed medicines to control cholesterol as directed by your health care provider.  Manage your diabetes.  Controlling your  carbohydrate and sugar intake is recommended to manage diabetes.  Take any prescribed medicines to control diabetes as directed by your health care provider.  Control your hypertension.  Making food choices that are low in salt (sodium), saturated fat, trans fat, and cholesterol is recommended to manage hypertension.  Ask your health care provider if you need treatment to lower your blood pressure. Take any prescribed medicines to control hypertension as directed by your health care provider.  If you are 27-40 years of age, have your blood pressure checked every 3-5 years. If you are 67 years of age or older, have your blood pressure checked every year.  Maintain a healthy weight.  Reducing calorie intake and making food choices that are low in sodium, saturated fat, trans fat, and cholesterol are recommended to manage weight.  Stop drug abuse.  Avoid taking birth control pills.  Talk to your health care provider about the risks of taking birth control pills if you are over 38 years old, smoke, get migraines, or have ever had a blood clot.  Get evaluated for sleep disorders (sleep apnea).  Talk to your health care provider about getting a sleep evaluation if you snore a lot or have excessive sleepiness.  Take medicines only as directed by your health care provider.  For some people, aspirin or blood thinners (anticoagulants) are helpful in reducing the risk of forming abnormal blood clots that can lead to stroke. If you have the irregular heart rhythm of atrial fibrillation, you should be on a blood thinner unless there is a good reason you cannot take them.  Understand all your medicine instructions.  Make  sure that other conditions (such as anemia or atherosclerosis) are addressed. Get help right away if:  You have sudden weakness or numbness of the face, arm, or leg, especially on one side of the body.  Your face or eyelid droops to one side.  You have sudden  confusion.  You have trouble speaking (aphasia) or understanding.  You have sudden trouble seeing in one or both eyes.  You have sudden trouble walking.  You have dizziness.  You have a loss of balance or coordination.  You have a sudden, severe headache with no known cause.  You have new chest pain or an irregular heartbeat. Any of these symptoms may represent a serious problem that is an emergency. Do not wait to see if the symptoms will go away. Get medical help at once. Call your local emergency services (911 in [place] Do not drive yourself to the hospital. This information is not intended to replace advice given to you by your health care provider. Make sure you discuss any questions you have with your health care provider. Document Released: 08/03/2004 Document Revised: 12/02/2015 Document Reviewed: 12/27/2012 Elsevier Interactive Patient Education  2017 [employer].

## 2016-09-28 ENCOUNTER — Ambulatory Visit (INDEPENDENT_AMBULATORY_CARE_PROVIDER_SITE_OTHER): Payer: Medicare Other | Admitting: Family Medicine

## 2016-09-28 ENCOUNTER — Encounter: Payer: Self-pay | Admitting: Family Medicine

## 2016-09-28 VITALS — BP 200/70 | HR 64 | Ht 65.0 in | Wt 158.0 lb

## 2016-09-28 DIAGNOSIS — I10 Essential (primary) hypertension: Secondary | ICD-10-CM | POA: Diagnosis not present

## 2016-09-28 DIAGNOSIS — I639 Cerebral infarction, unspecified: Secondary | ICD-10-CM

## 2016-09-28 DIAGNOSIS — L299 Pruritus, unspecified: Secondary | ICD-10-CM

## 2016-09-28 NOTE — Patient Instructions (Signed)
Use the cortisone cream sparingly for the next month on your ankle and let me know if it doesn't go away. If that works then back off on it but don't stop Try Claritin or Allegra for the itching. If neither one works then give me a call Start back on your blood pressure medicine

## 2016-09-28 NOTE — Progress Notes (Signed)
   Subjective:    Patient ID: Tammy Lloyd, female    DOB: [birth year], 83 y.o.   MRN: [id number]  HPI She is here for a recheck. She continues have difficulty with itching. She stopped taking her amlodipine thinking it was causing it but the itching continues. She did bring her blood pressure readings in and they do show a variation. Does have a new rash present on her left medial ankle.   Review of Systems     Objective:   Physical Exam alert and in no distress. Blood pressure is recorded. Erythematous 5 cm rash noted on the medial aspect of the calf.      Assessment & Plan:  Essential hypertension  Pruritic condition  Use the cortisone cream sparingly for the next month on your ankle and let me know if it doesn't go away. If that works then back off on it but don't stop Try Claritin or Allegra for the itching. If neither one works then give me a call Start back on your blood pressure medicine

## 2016-10-05 ENCOUNTER — Telehealth: Payer: Self-pay | Admitting: Family Medicine

## 2016-10-05 MED ORDER — AMLODIPINE BESYLATE 5 MG PO TABS: 5.0000 mg | ORAL_TABLET | Freq: Every day | ORAL | 3 refills | Status: DC

## 2016-10-05 MED ORDER — RIVAROXABAN 15 MG PO TABS: 15.0000 mg | ORAL_TABLET | Freq: Every day | ORAL | 0 refills | Status: DC

## 2016-10-05 NOTE — Telephone Encounter (Signed)
Pt requested that Xarelto 15mg  and Amlodipine 5 mg script be sent to [facility]. Pt said Dr Redmond School changed Amlodipine to 5mg  recently

## 2016-10-05 NOTE — Telephone Encounter (Signed)
I do not see documentation of what dose of BP pt is suppose to be on. And is it okay to refill xarelto

## 2016-10-05 NOTE — Telephone Encounter (Signed)
Done

## 2016-10-05 NOTE — Telephone Encounter (Signed)
Xarelto should be 20 mg and give her 5mg  of amlodipine 90 3 refills

## 2016-10-31 ENCOUNTER — Other Ambulatory Visit: Payer: Self-pay

## 2016-10-31 NOTE — Patient Outreach (Signed)
First attempt to obtain mRS. No answer. Left message for returned call.

## 2016-11-02 ENCOUNTER — Other Ambulatory Visit: Payer: Self-pay

## 2016-11-02 NOTE — Patient Outreach (Signed)
Telephone outreach to patient to obtain mRS was successfully completed. mRS = 0 

## 2016-12-04 ENCOUNTER — Other Ambulatory Visit: Payer: Self-pay | Admitting: Family Medicine

## 2016-12-05 NOTE — Telephone Encounter (Signed)
Is this okay to refill? 

## 2016-12-28 ENCOUNTER — Encounter: Payer: Self-pay | Admitting: Family Medicine

## 2016-12-28 ENCOUNTER — Ambulatory Visit (INDEPENDENT_AMBULATORY_CARE_PROVIDER_SITE_OTHER): Payer: Medicare Other | Admitting: Family Medicine

## 2016-12-28 VITALS — BP 124/70 | HR 53 | Ht 65.0 in | Wt 154.0 lb

## 2016-12-28 DIAGNOSIS — Z23 Encounter for immunization: Secondary | ICD-10-CM

## 2016-12-28 DIAGNOSIS — I482 Chronic atrial fibrillation, unspecified: Secondary | ICD-10-CM

## 2016-12-28 DIAGNOSIS — H9113 Presbycusis, bilateral: Secondary | ICD-10-CM | POA: Diagnosis not present

## 2016-12-28 DIAGNOSIS — I10 Essential (primary) hypertension: Secondary | ICD-10-CM | POA: Diagnosis not present

## 2016-12-28 DIAGNOSIS — I639 Cerebral infarction, unspecified: Secondary | ICD-10-CM | POA: Diagnosis not present

## 2016-12-28 DIAGNOSIS — M1990 Unspecified osteoarthritis, unspecified site: Secondary | ICD-10-CM

## 2016-12-28 DIAGNOSIS — Z8673 Personal history of transient ischemic attack (TIA), and cerebral infarction without residual deficits: Secondary | ICD-10-CM

## 2016-12-28 DIAGNOSIS — Z853 Personal history of malignant neoplasm of breast: Secondary | ICD-10-CM | POA: Diagnosis not present

## 2016-12-28 DIAGNOSIS — R269 Unspecified abnormalities of gait and mobility: Secondary | ICD-10-CM

## 2016-12-28 DIAGNOSIS — E785 Hyperlipidemia, unspecified: Secondary | ICD-10-CM | POA: Diagnosis not present

## 2016-12-28 LAB — CBC WITH DIFFERENTIAL/PLATELET
BASOS PCT: 1 %
Basophils Absolute: 97 cells/uL (ref 0–200)
Eosinophils Absolute: 194 cells/uL (ref 15–500)
Eosinophils Relative: 2 %
HCT: 35.4 % (ref 35.0–45.0)
Hemoglobin: 11.8 g/dL (ref 11.7–15.5)
LYMPHS PCT: 21 %
Lymphs Abs: 2037 cells/uL (ref 850–3900)
MCH: 32.3 pg (ref 27.0–33.0)
MCHC: 33.3 g/dL (ref 32.0–36.0)
MCV: 97 fL (ref 80.0–100.0)
MONOS PCT: 9 %
MPV: 11.3 fL (ref 7.5–12.5)
Monocytes Absolute: 873 cells/uL (ref 200–950)
NEUTROS ABS: 6499 {cells}/uL (ref 1500–7800)
Neutrophils Relative %: 67 %
PLATELETS: 305 10*3/uL (ref 140–400)
RBC: 3.65 MIL/uL — AB (ref 3.80–5.10)
RDW: 13.5 % (ref 11.0–15.0)
WBC: 9.7 10*3/uL (ref 4.0–10.5)

## 2016-12-28 MED ORDER — PRAVASTATIN SODIUM 20 MG PO TABS: 20.0000 mg | ORAL_TABLET | Freq: Every day | ORAL | 3 refills | Status: DC

## 2016-12-28 MED ORDER — HYDRALAZINE HCL 10 MG PO TABS: 10.0000 mg | ORAL_TABLET | Freq: Two times a day (BID) | ORAL | 3 refills | Status: DC

## 2016-12-28 NOTE — Progress Notes (Signed)
Subjective:   HPI  Tammy Lloyd is a 83 y.o. female who presents for Chief Complaint  Patient presents with  . Medicare Wellness    med check plus    Medical care team includes: Denita Lung, MD here for primary care  Dr.Rodrigues-Guzman Dr.Fontain Dr Terrence Dupont  Preventative care:  Last ophthalmology visit: 2 to 3 months ago Last dental visit: 3/18 Last colonoscopy: 7 to 8 years ago Last mammogram: 05/30/16 Last pap: N/A Last EKG:08/02/16 Last labs: done here 2016  Prior vaccinations:  TD or Tdap:06/15/16 Influenza:-- Pneumococcal: 23; 12/27/01 02/03/13 Shingles/Zostavax:Shingrix prescription written Advanced directive: Yes. I asked for a copy. Concerns: She was seen recently in the hospital and evaluated for stroke. Indeed did show 1. Her medications were readjusted. She was placed on Pravachol and also hydralazine. She has a previous history of coronary artery disease but not of MI. She does have underlying atrial fibrillation and presently is on Xarelto for this. She continues on amlodipine, hydralazine, losartan/HCTZ for her blood pressure. Presently she is not having any trouble with reflux symptoms. She uses a cane because of being unstable on her feet. She does have arthritis but no specific areas of great concern. She does use bilateral hearing aids. Does have a previous history of breast cancer. Reviewed their medical, surgical, family, social, medication, and allergy history and updated chart as appropriate.  Past Medical History:  Diagnosis Date  . Arthritis   . Cancer ([place])    Breast  . Cataract   . Colon polyp   . Edema of both legs   . Hemochromatosis    Followed by  hematology;  . Hemorrhoid   . Hypertension   . Idiopathic hemochromatosis ([place]) 09/28/2011  . Lichen sclerosus   . Non-obstructive CAD (coronary artery disease)    a. 10/2011 Cath [facility]): LM nl, LAD 5/10ost, 10/15p, D1 nl, LCX 10/15ost, OM1/2 small, OM3 nl, RCA large/nl, RPDA/RPLV nl, EF  55-60%.  . Osteoporosis 2005   DEXA 2015 T score -1.7 stable from prior DEXA 2011 history of Fosamax 8 years. On drug-free holiday now  . Persistent atrial fibrillation ([facility])    a. CHA2DS2VASc = 5 -->Xarelto;  b. 07/2016 Echo: EF 55-60%, no rwma, mild MR, mild biatrial enlargement.  . Pulmonary hypertension   . Stroke [facility])    a. 07/2016 MRI: punctate acute ischemia in the R mid brain and possibly within the right pons w/o assoc hemorrhage or mass effect. Chronic microvascular ischemia.    Past Surgical History:  Procedure Laterality Date  . BACK SURGERY  2008  . BREAST LUMPECTOMY     Right  . CATARACT EXTRACTION, BILATERAL    . COLONOSCOPY  2009  . DILATION AND CURETTAGE OF UTERUS  2005  . HYSTEROSCOPY  08/2003  . LEFT HEART CATHETERIZATION WITH CORONARY ANGIOGRAM N/A 10/24/2011   Procedure: LEFT HEART CATHETERIZATION WITH CORONARY ANGIOGRAM;  Surgeon: Clent Demark, MD;  Location: [place] CATH LAB;  Service: Cardiovascular;  Laterality: N/A;  . TOTAL KNEE ARTHROPLASTY  2006    Social History   Social History  . Marital status: Married    Spouse name: N/A  . Number of children: N/A  . Years of education: N/A   Occupational History  . Not on file.   Social History Main Topics  . Smoking status: Never Smoker  . Smokeless tobacco: Never Used  . Alcohol use No  . Drug use: No  . Sexual activity: No     Comment: 1st intercourse 83  yo-Fewer than 5 partners   Other Topics Concern  . Not on file   Social History Narrative   Lives alone.  Granddaughter is currently living with her. No pets    Family History  Problem Relation Age of Onset  . Cancer Mother 64       GYN cancer (had hysterectomy)  . Heart disease Mother        CHF  . Cancer Father        liver  . Asthma Sister   . Breast cancer Sister 84  . Heart disease Brother        rheumatic heart attack  . Breast cancer Sister 32  . Asthma Brother   . Cancer Brother        Prostate  . Cancer Brother         Prostate     Current Outpatient Prescriptions:  .  acetaminophen (TYLENOL) 325 MG tablet, Take 650 mg by mouth every 6 (six) hours as needed for headache (or pain). Reported on 10/14/2015, Disp: , Rfl:  .  amLODipine (NORVASC) 5 MG tablet, Take 1 tablet (5 mg total) by mouth daily., Disp: 90 tablet, Rfl: 3 .  hydrALAZINE (APRESOLINE) 10 MG tablet, Take 1 tablet (10 mg total) by mouth 2 (two) times daily., Disp: 90 tablet, Rfl: 2 .  losartan-hydrochlorothiazide (HYZAAR) 100-25 MG tablet, Take 1 tablet by mouth daily., Disp: , Rfl:  .  Multiple Vitamins-Minerals (PRESERVISION AREDS PO), Take 1 capsule by mouth 2 (two) times daily. , Disp: , Rfl:  .  pravastatin (PRAVACHOL) 20 MG tablet, Take 1 tablet (20 mg total) by mouth at bedtime., Disp: 30 tablet, Rfl: 6 .  XARELTO 15 MG TABS tablet, TAKE 1 TABLET BY MOUTH  DAILY WITH SUPPER, Disp: 90 tablet, Rfl: 1 .  clobetasol cream (TEMOVATE) 2.35 %, Apply 1 application topically 2 (two) times daily as needed (for itching). Reported on 10/14/2015, Disp: , Rfl:  .  Prednicarbate 0.1 % CREA, Apply 1 application topically 2 (two) times daily as needed. For itching (Patient not taking: Reported on 09/28/2016), Disp: 60 g, Rfl: 1  Allergies  Allergen Reactions  . Tape Other (See Comments)    Skin is thin and may tear or bruise easily       Review of Systems Negative except as above   Objective:   General appearance: alert, no distress, WD/WN, Caucasian female Skin: Normal HEENT: normocephalic, conjunctiva/corneas normal, sclerae anicteric, PERRLA, EOMi, nares patent, no discharge or erythema, pharynx normal. Hearing aids present in both ears Oral cavity: MMM, tongue normal, teeth normal Neck: supple, no lymphadenopathy, no thyromegaly, no masses, normal ROM, no bruits Chest: non tender, normal shape and expansion Heart: RRR, normal S1, S2, no murmurs Lungs: CTA bilaterally, no wheezes, rhonchi, or rales Abdomen: +bs, soft, non tender, non distended,  no masses, no hepatomegaly, no splenomegaly, no bruits Musculoskeletal: upper extremities non tender, no obvious deformity, normal ROM throughout, lower extremities non tender, no obvious deformity, normal ROM throughout Extremities: no edema, no cyanosis, no clubbing Pulses: 2+ symmetric, upper and lower extremities, normal cap refill Neurological: alert, oriented x 3, CN2-12 intact, strength normal upper extremities and lower extremities, sensation normal throughout, DTRs 2+ throughout, no cerebellar signs, gait normal Psychiatric: normal affect, behavior normal, pleasant     Assessment and Plan :   Chronic atrial fibrillation (HCC) - Plan: CBC with Differential/Platelet, Comprehensive metabolic panel, Lipid panel  Need for vaccination against Streptococcus pneumoniae - Plan: Varicella-zoster vaccine IM (Shingrix)  Abnormality of gait  History of CVA (cerebrovascular accident) - Plan: CBC with Differential/Platelet, Comprehensive metabolic panel, pravastatin (PRAVACHOL) 20 MG tablet  History of breast cancer  Arthritis  Essential hypertension - Plan: hydrALAZINE (APRESOLINE) 10 MG tablet  Presbycusis of both ears  Need for pneumococcal vaccination - Plan: Pneumococcal conjugate vaccine 13-valent  Hyperlipidemia, unspecified hyperlipidemia type - Plan: pravastatin (PRAVACHOL) 20 MG tablet Overall she is stable on her present medication regimen for the above diagnoses. No intervention is needed at this point.  Physical exam - discussed and counseled on healthy lifestyle, diet, exercise, preventative care, vaccinations, sick and well care, proper use of emergency dept and after hours care, and addressed their concerns.

## 2016-12-29 LAB — COMPREHENSIVE METABOLIC PANEL
ALK PHOS: 69 U/L (ref 33–130)
ALT: 9 U/L (ref 6–29)
AST: 14 U/L (ref 10–35)
Albumin: 4 g/dL (ref 3.6–5.1)
BILIRUBIN TOTAL: 0.5 mg/dL (ref 0.2–1.2)
BUN: 26 mg/dL — AB (ref 7–25)
CO2: 23 mmol/L (ref 20–31)
CREATININE: 1.12 mg/dL — AB (ref 0.60–0.88)
Calcium: 9.2 mg/dL (ref 8.6–10.4)
Chloride: 102 mmol/L (ref 98–110)
Glucose, Bld: 95 mg/dL (ref 65–99)
Potassium: 4.8 mmol/L (ref 3.5–5.3)
SODIUM: 135 mmol/L (ref 135–146)
Total Protein: 6.6 g/dL (ref 6.1–8.1)

## 2016-12-29 LAB — LIPID PANEL
CHOL/HDL RATIO: 2.4 ratio (ref <5.0)
Cholesterol: 139 mg/dL (ref <200)
HDL: 59 mg/dL (ref >50)
LDL Cholesterol: 60 mg/dL (ref <100)
Triglycerides: 99 mg/dL (ref <150)
VLDL: 20 mg/dL (ref <30)

## 2017-01-01 ENCOUNTER — Telehealth: Payer: Self-pay | Admitting: Family Medicine

## 2017-01-01 NOTE — Telephone Encounter (Signed)
Advised pt this was ordered on 6/21 and she should get it soon,  She recd the pravachol today.

## 2017-01-04 ENCOUNTER — Telehealth: Payer: Self-pay | Admitting: Family Medicine

## 2017-01-04 ENCOUNTER — Telehealth: Payer: Self-pay

## 2017-01-04 DIAGNOSIS — I10 Essential (primary) hypertension: Secondary | ICD-10-CM

## 2017-01-04 MED ORDER — HYDRALAZINE HCL 10 MG PO TABS: 10.0000 mg | ORAL_TABLET | Freq: Two times a day (BID) | ORAL | 3 refills | Status: DC

## 2017-01-04 NOTE — Telephone Encounter (Signed)
Dr. Redmond School refilled this at pt's visit but it was done as a no print. I have gone back and redid and sent it to [facility]

## 2017-01-04 NOTE — Telephone Encounter (Signed)
Some how it went to [facility] and they have refilled it.  Pt is aware and will pick up there.  She does want future refills to [facility].

## 2017-01-04 NOTE — Telephone Encounter (Signed)
Pt needs hydralazine called to [facility] per faxed request. Tammy Lloyd

## 2017-01-04 NOTE — Telephone Encounter (Signed)
Pt wanted this med to [facility].  She is down to one pill.  This med was sent to [facility] and they have it ready now.  Pt will pick up this time at [facility].  She will call me when time for refill and I will send to [facility]

## 2017-02-12 ENCOUNTER — Telehealth: Payer: Self-pay | Admitting: Family Medicine

## 2017-02-12 NOTE — Telephone Encounter (Signed)
Have her stop the medicine and see if the itching goes away and if not then let us know

## 2017-02-12 NOTE — Telephone Encounter (Signed)
Pt called and left message and states she read one of the side effects of the Hydralazine is itching.  Pt has been itching for 2 years now.  Could she come off of this/and or try something else in its place? Please advise pt [phone]

## 2017-02-12 NOTE — Telephone Encounter (Signed)
Pt informed word for word and verbalized understanding

## 2017-02-24 ENCOUNTER — Other Ambulatory Visit: Payer: Self-pay | Admitting: Family Medicine

## 2017-03-08 ENCOUNTER — Ambulatory Visit (INDEPENDENT_AMBULATORY_CARE_PROVIDER_SITE_OTHER): Payer: Medicare Other | Admitting: Adult Health

## 2017-03-08 ENCOUNTER — Encounter (INDEPENDENT_AMBULATORY_CARE_PROVIDER_SITE_OTHER): Payer: Self-pay

## 2017-03-08 ENCOUNTER — Encounter: Payer: Self-pay | Admitting: Adult Health

## 2017-03-08 VITALS — BP 151/70 | HR 59 | Wt 151.0 lb

## 2017-03-08 DIAGNOSIS — Z8673 Personal history of transient ischemic attack (TIA), and cerebral infarction without residual deficits: Secondary | ICD-10-CM | POA: Diagnosis not present

## 2017-03-08 DIAGNOSIS — I639 Cerebral infarction, unspecified: Secondary | ICD-10-CM

## 2017-03-08 NOTE — Progress Notes (Signed)
PATIENT: Tammy Lloyd DOB: 1923/10/14  REASON FOR VISIT: follow up- TIA HISTORY FROM: patient  HISTORY OF PRESENT ILLNESS: Today 03/08/17 Tammy Lloyd is a 83 year old female with a history of TIA event. She returns today for follow-up. She remains on Xarelto and is tolerating it well. Denies any significant bruising or bleeding. Her blood pressure is slightly elevated today. She states that she does check it at home and takes her medication regularly. She reports that her primary care is managing her blood pressure and cholesterol. She reports that she is not diabetic. She denies any additional strokelike symptoms. She returns today for an evaluation.  HISTORY 09/06/16: Tammy Lloyd is a pleasant 83 year old Caucasian lady seen today for first office follow-up visit following admission for TIA in January 2018. She is accompanied by her Tammy Lloyd and history is obtain from both of them as well as review of hospital records and have personally reviewed imaging films. Tammy Lloyd an 83 y.o.femalewith a prior history of Tammy Lloyd presents with acute onset of dysphasia.She was at home cooking dinner when she had sudden onset of "trouble getting her words out". Her prior TIA had the same symptoms. She has a history of atrial fibrillation. When asked, she denies taking ASA, Plavix, Aggrenox or a blood thinner (specifically stated no to dabigatran, Eliquis, Xarelto and Coumadin/warfarin). Given her aphasia, family wasunsure if her responses are reliable and were asked to locate her medications list to ensure that she is not on an anticoagulant in order to make decision to consent or not consent to tPA. The patient is unable to make complex medical decisions in the context of her aphasia. Tammy Lloyd arrived to ED and verified that patient was not taking an anticoagulant; previously was on a DOAC but stopped taking on her own as she thought it might be causing side effect of itching.  LKN 5:50 PM. NIHSS = 2.The  patient was administered IV t-PA 1/11/12018 at 2008. Shewas admitted to the neuro ICU for further evaluation and treatment. Patient's showed excellent clinical recovery. MRI scan in fact did not show an acute infarct explaining her symptoms but did show tiny infarcts in the right midbrain and possibly right pons which were felt to be embolic but not causing her clinical symptoms.. Echocardiogram showed normal ejection fraction. LDL cholesterol was elevated at 114 mg percent. Hemoglobin A1c was 5.2. CT angiogram of the head and neck were significant only for 60% right vertebral stenosis which was asymptomatic. Patient had known history of atrial fibrillation but had been only on aspirin. She was switched to Xarelto for secondary stroke prevention discharge home. Patient has done well since discharge. She is tolerating Xarelto well without bleeding or bruising. She states her blood pressure is well controlled at home but today it is slightly elevated at 145/6 fine office. She is using a cane mostly and uses a walker for long distances. She is not no recent falls or injuries. She is bothered by her knee causing a lot of pain but she does not want knee replacement surgery at her age.   REVIEW OF SYSTEMS: Out of a complete 14 system review of symptoms, the patient complains only of the following symptoms, and all other reviewed systems are negative.  Walking difficulty  ALLERGIES: Allergies  Allergen Reactions  . Tape Other (See Comments)    Skin is thin and may tear or bruise easily    HOME MEDICATIONS: Outpatient Medications Prior to Visit  Medication Sig Dispense Refill  . acetaminophen (TYLENOL)  325 MG tablet Take 650 mg by mouth every 6 (six) hours as needed for headache (or pain). Reported on 10/14/2015    . amLODipine (NORVASC) 5 MG tablet Take 1 tablet (5 mg total) by mouth daily. 90 tablet 3  . clobetasol cream (TEMOVATE) 9.81 % Apply 1 application topically 2 (two) times daily as needed (for  itching). Reported on 10/14/2015    . hydrALAZINE (APRESOLINE) 10 MG tablet Take 1 tablet (10 mg total) by mouth 2 (two) times daily. 90 tablet 3  . losartan-hydrochlorothiazide (HYZAAR) 100-25 MG tablet TAKE 1 TABLET BY MOUTH  DAILY 90 tablet 0  . Multiple Vitamins-Minerals (PRESERVISION AREDS PO) Take 1 capsule by mouth 2 (two) times daily.     . pravastatin (PRAVACHOL) 20 MG tablet Take 1 tablet (20 mg total) by mouth at bedtime. 90 tablet 3  . Prednicarbate 0.1 % CREA Apply 1 application topically 2 (two) times daily as needed. For itching 60 g 1  . XARELTO 15 MG TABS tablet TAKE 1 TABLET BY MOUTH  DAILY WITH SUPPER 90 tablet 1   No facility-administered medications prior to visit.     PAST MEDICAL HISTORY: Past Medical History:  Diagnosis Date  . Arthritis   . Cancer ([place])    Breast  . Cataract   . Colon polyp   . Edema of both legs   . Hemochromatosis    Followed by  hematology;  . Hemorrhoid   . Hypertension   . Idiopathic hemochromatosis ([place]) 09/28/2011  . Lichen sclerosus   . Non-obstructive CAD (coronary artery disease)    a. 10/2011 Cath [facility]): LM nl, LAD 5/10ost, 10/15p, D1 nl, LCX 10/15ost, OM1/2 small, OM3 nl, RCA large/nl, RPDA/RPLV nl, EF 55-60%.  . Osteoporosis 2005   DEXA 2015 T score -1.7 stable from prior DEXA 2011 history of Fosamax 8 years. On drug-free holiday now  . Persistent atrial fibrillation ([facility])    a. CHA2DS2VASc = 5 -->Xarelto;  b. 07/2016 Echo: EF 55-60%, no rwma, mild MR, mild biatrial enlargement.  . Pulmonary hypertension ([place])   . Stroke [facility])    a. 07/2016 MRI: punctate acute ischemia in the R mid brain and possibly within the right pons w/o assoc hemorrhage or mass effect. Chronic microvascular ischemia.    PAST SURGICAL HISTORY: Past Surgical History:  Procedure Laterality Date  . BACK SURGERY  2008  . BREAST LUMPECTOMY     Right  . CATARACT EXTRACTION, BILATERAL    . COLONOSCOPY  2009  . DILATION AND CURETTAGE OF UTERUS  2005  .  HYSTEROSCOPY  08/2003  . LEFT HEART CATHETERIZATION WITH CORONARY ANGIOGRAM N/A 10/24/2011   Procedure: LEFT HEART CATHETERIZATION WITH CORONARY ANGIOGRAM;  Surgeon: Clent Demark, MD;  Location: [place] [place] CATH LAB;  Service: Cardiovascular;  Laterality: N/A;  . TOTAL KNEE ARTHROPLASTY  2006    FAMILY HISTORY: Family History  Problem Relation Age of Onset  . Cancer Mother 17       GYN cancer (had hysterectomy)  . Heart disease Mother        CHF  . Cancer Father        liver  . Asthma Sister   . Breast cancer Sister 39  . Heart disease Brother        rheumatic heart attack  . Breast cancer Sister 68  . Asthma Brother   . Cancer Brother        Prostate  . Cancer Brother        Prostate  SOCIAL HISTORY: Social History   Social History  . Marital status: Widowed    Spouse name: N/A  . Number of children: N/A  . Years of education: N/A   Occupational History  . Not on file.   Social History Main Topics  . Smoking status: Never Smoker  . Smokeless tobacco: Never Used  . Alcohol use No  . Drug use: No  . Sexual activity: No     Comment: 1st intercourse 83 yo-Fewer than 5 partners   Other Topics Concern  . Not on file   Social History Narrative   03/08/17 Lives alone. No pets      PHYSICAL EXAM  Vitals:   03/08/17 1035  BP: (!) 151/70  Pulse: (!) 59  Weight: 151 lb (68.5 kg)   Body mass index is 25.13 kg/m.  Generalized: Well developed, in no acute distress   Neurological examination  Mentation: Alert oriented to time, place, history taking. Follows all commands speech and language fluent Cranial nerve II-XII: Pupils were equal round reactive to light. Extraocular movements were full, visual field were full on confrontational test. Facial sensation and strength were normal. Uvula tongue midline. Head turning and shoulder shrug  were normal and symmetric. Motor: The motor testing reveals 5 over 5 strength of all 4 extremities. Good symmetric motor tone is  noted throughout.  Sensory: Sensory testing is intact to soft touch on all 4 extremities. No evidence of extinction is noted.  Coordination: Cerebellar testing reveals good finger-nose-finger and heel-to-shin bilaterally.  Gait and station: Gait is slightly unsteady. She uses a cane when ambulating. She has mild circumduction type gait on the left. Tandem gait not attempted. Reflexes: Deep tendon reflexes are symmetric and normal bilaterally.   DIAGNOSTIC DATA (LABS, IMAGING, TESTING) - I reviewed patient records, labs, notes, testing and imaging myself where available.  Lab Results  Component Value Date   WBC 9.7 12/28/2016   HGB 11.8 12/28/2016   HCT 35.4 12/28/2016   MCV 97.0 12/28/2016   PLT 305 12/28/2016      Component Value Date/Time   NA 135 12/28/2016 1321   NA 139 06/10/2013 0936   K 4.8 12/28/2016 1321   K 4.7 06/10/2013 0936   CL 102 12/28/2016 1321   CL 106 09/30/2012 0913   CO2 23 12/28/2016 1321   CO2 24 06/10/2013 0936   GLUCOSE 95 12/28/2016 1321   GLUCOSE 101 06/10/2013 0936   GLUCOSE 95 09/30/2012 0913   BUN 26 (H) 12/28/2016 1321   BUN 27.8 (H) 06/10/2013 0936   CREATININE 1.12 (H) 12/28/2016 1321   CREATININE 1.2 (H) 06/10/2013 0936   CALCIUM 9.2 12/28/2016 1321   CALCIUM 9.4 06/10/2013 0936   PROT 6.6 12/28/2016 1321   PROT 6.8 01/02/2013 0918   ALBUMIN 4.0 12/28/2016 1321   ALBUMIN 3.5 01/02/2013 0918   AST 14 12/28/2016 1321   AST 13 01/02/2013 0918   ALT 9 12/28/2016 1321   ALT 7 01/02/2013 0918   ALKPHOS 69 12/28/2016 1321   ALKPHOS 77 01/02/2013 0918   BILITOT 0.5 12/28/2016 1321   BILITOT 0.31 01/02/2013 0918   GFRNONAA 51 (L) 07/22/2016 0230   GFRAA 59 (L) 07/22/2016 0230   Lab Results  Component Value Date   CHOL 139 12/28/2016   HDL 59 12/28/2016   LDLCALC 60 12/28/2016   TRIG 99 12/28/2016   CHOLHDL 2.4 12/28/2016   Lab Results  Component Value Date   HGBA1C 5.2 07/21/2016   Lab Results  Component  Value Date   VITAMINB12  304 01/08/2007   Lab Results  Component Value Date   TSH 4.574 (H) 10/06/2013      ASSESSMENT AND PLAN 83 y.o. year old female  has a past medical history of Arthritis; Cancer ([place]); Cataract; Colon polyp; Edema of both legs; Hemochromatosis; Hemorrhoid; Hypertension; Idiopathic hemochromatosis ([place]) (09/28/2011); Lichen sclerosus; Non-obstructive CAD (coronary artery disease); Osteoporosis (2005); Persistent atrial fibrillation ([place]); Pulmonary hypertension ([place]); and Stroke ([place]). here with :  1. History of TIA event  Overall the patient has remained stable. She will continue on Xarelto. I have encouraged the patient to maintain strict control of her blood pressure goal less than 130/90, cholesterol LDL less than 70 and hemoglobin A1c less than 6.5%. Her blood pressure is slightly elevated today. Advised that if she finds that her systolic blood pressure was consistently running over 150 she should make her primary care aware. She voiced understanding. She will follow-up in 6 months or sooner if needed.  I spent 15 minutes with the patient. 50% of this time was spent was spent discussing her stroke symptoms.     Ward Givens, MSN, NP-C 03/08/2017, 10:42 AM [facility] [address] [place], [place] [address] [phone]

## 2017-03-08 NOTE — Patient Instructions (Signed)
Your Plan:  Continue xarelto Blood Pressure <130/90 Cholesterol LDL <70 HgbA1c <6.Marland Kitchen5 % If you have any stroke like symptoms please call 911 If your symptoms worsen or you develop new symptoms please let us know.   Thank you for coming to see [place] at [facility]. I hope we have been able to provide you high quality care today.  You may receive a patient satisfaction survey over the next few weeks. We would appreciate your feedback and comments so that we may continue to improve ourselves and the health of our patients.

## 2017-03-15 NOTE — Progress Notes (Signed)
I agree with the above plan 

## 2017-04-03 ENCOUNTER — Other Ambulatory Visit: Payer: Self-pay | Admitting: Family Medicine

## 2017-04-03 ENCOUNTER — Telehealth: Payer: Self-pay | Admitting: Family Medicine

## 2017-04-03 MED ORDER — LOSARTAN POTASSIUM-HCTZ 100-25 MG PO TABS: 1.0000 | ORAL_TABLET | Freq: Every day | ORAL | 1 refills | Status: DC

## 2017-04-03 NOTE — Telephone Encounter (Signed)
[facility] refill request for Hyzaar 100-25

## 2017-04-03 NOTE — Telephone Encounter (Signed)
Refilled med

## 2017-04-03 NOTE — Telephone Encounter (Signed)
Is this okay to refill? 

## 2017-05-03 ENCOUNTER — Other Ambulatory Visit: Payer: Self-pay | Admitting: Family Medicine

## 2017-05-03 DIAGNOSIS — I10 Essential (primary) hypertension: Secondary | ICD-10-CM

## 2017-05-24 ENCOUNTER — Encounter: Payer: Self-pay | Admitting: Gynecology

## 2017-05-24 ENCOUNTER — Ambulatory Visit (INDEPENDENT_AMBULATORY_CARE_PROVIDER_SITE_OTHER): Payer: Medicare Other | Admitting: Gynecology

## 2017-05-24 ENCOUNTER — Telehealth: Payer: Self-pay | Admitting: *Deleted

## 2017-05-24 VITALS — BP 124/78 | Ht 64.0 in | Wt 151.0 lb

## 2017-05-24 DIAGNOSIS — N952 Postmenopausal atrophic vaginitis: Secondary | ICD-10-CM

## 2017-05-24 DIAGNOSIS — Z853 Personal history of malignant neoplasm of breast: Secondary | ICD-10-CM

## 2017-05-24 DIAGNOSIS — M810 Age-related osteoporosis without current pathological fracture: Secondary | ICD-10-CM

## 2017-05-24 DIAGNOSIS — Z01411 Encounter for gynecological examination (general) (routine) with abnormal findings: Secondary | ICD-10-CM | POA: Diagnosis not present

## 2017-05-24 DIAGNOSIS — L900 Lichen sclerosus et atrophicus: Secondary | ICD-10-CM

## 2017-05-24 MED ORDER — PREDNICARBATE 0.1 % EX CREA: 1.0000 "application " | TOPICAL_CREAM | Freq: Two times a day (BID) | CUTANEOUS | 1 refills | Status: DC | PRN

## 2017-05-24 MED ORDER — CLOBETASOL PROPIONATE 0.05 % EX CREA: TOPICAL_CREAM | CUTANEOUS | 1 refills | Status: DC

## 2017-05-24 NOTE — Telephone Encounter (Signed)
Rx was sent today for prednicarbate cream 0.1% pharmacist said Rx is not available. Please advise

## 2017-05-24 NOTE — Patient Instructions (Signed)
Follow-up in 1 year for annual exam, sooner if any issues. 

## 2017-05-24 NOTE — Telephone Encounter (Signed)
Okay for clobetasol 0.05% cream 30 g apply nightly as needed for irritation

## 2017-05-24 NOTE — Progress Notes (Signed)
    Tammy Lloyd 27-Jan-1924 [id number]        83 y.o.  G1P1 for breast and pelvic exam.  Several issues noted below.  Past medical history,surgical history, problem list, medications, allergies, family history and social history were all reviewed and documented as reviewed in the EPIC chart.  ROS:  Performed with pertinent positives and negatives included in the history, assessment and plan.   Additional significant findings : None   Exam: Caryn Bee assistant Vitals:   05/24/17 0945  BP: 124/78  Weight: 151 lb (68.5 kg)  Height: 5\' 4"  (1.626 m)   Body mass index is 25.92 kg/m.  General appearance:  Normal affect, orientation and appearance. Skin: Grossly normal HEENT: Without gross lesions.  No cervical or supraclavicular adenopathy. Thyroid normal.  Lungs:  Clear without wheezing, rales or rhonchi Cardiac: RR, without RMG Abdominal:  Soft, nontender, without masses, guarding, rebound, organomegaly or hernia Breasts:  Examined lying and sitting without masses, retractions, discharge or axillary adenopathy.  Well-healed right lumpectomy scar Pelvic:  Ext, BUS, Vagina: With atrophic changes.  Perivaginal blanching consistent with her history of lichen sclerosis.  Significant shortening of the vagina.  Cervix: Not visualized.  Uterus: Unable to palpate but no gross masses or tenderness  Adnexa: Without gross masses or tenderness    Anus and perineum: Normal   Rectovaginal: Normal sphincter tone without palpated masses or tenderness.    Assessment/Plan:  83 y.o. G1P1 female for breast and pelvic exam.   1. Postmenopausal/atrophic genital changes.  No significant hot flushes, night sweats, vaginal dryness or any vaginal bleeding. 2. History of lichen sclerosis.  Uses prednicarbate 0.1% cream intermittently for irritation.  Refill provided.  Exam consistent with the diagnoses with no concerning changes. 3. Osteoporosis.  DEXA 2015 T score -1.7.  Stable from prior DEXA.  History  of Fosamax for 8 years on a drug-free holiday.  Discussed repeating bone density now but she is reluctant and prefers not to do so.  She understands the issues of fracture and possible treatment to decrease this risk but declines DEXA now. 4. Colonoscopy 2009.  Repeat at their recommended interval. 5. Mammography due now when she is going to call and schedule.  History of right breast cancer in the past.  Exam NED. 6. Pap smear 2008.  No Pap smear done today.  No history of significant abnormal Pap smears.  Current screening guidelines based on age we both agree to stop screening. 7. Health maintenance.  No routine lab work done as patient does this elsewhere.  Follow-up 1 year, sooner as needed.   Anastasio Auerbach MD, 10:19 AM 05/24/2017

## 2017-05-24 NOTE — Telephone Encounter (Signed)
New Rx sent.

## 2017-06-04 DIAGNOSIS — Z1231 Encounter for screening mammogram for malignant neoplasm of breast: Secondary | ICD-10-CM | POA: Diagnosis not present

## 2017-06-04 DIAGNOSIS — Z853 Personal history of malignant neoplasm of breast: Secondary | ICD-10-CM | POA: Diagnosis not present

## 2017-06-04 LAB — HM MAMMOGRAPHY

## 2017-06-06 ENCOUNTER — Encounter: Payer: Self-pay | Admitting: Family Medicine

## 2017-07-09 ENCOUNTER — Telehealth: Payer: Self-pay | Admitting: Family Medicine

## 2017-07-09 NOTE — Telephone Encounter (Signed)
Pt called and states she cannot afford the Xarelto it will cost her more than 400 out of pocket.  Can you send her something different in?  She has enough Xarelto to last her about 2 week.  Please advise pt [phone]

## 2017-07-10 NOTE — Telephone Encounter (Signed)
Give her some samples.  Have her check again since it is in the new year and will probably be a lot cheaper

## 2017-07-11 ENCOUNTER — Telehealth: Payer: Self-pay

## 2017-07-11 NOTE — Telephone Encounter (Signed)
Called patient and advised that we had samples, also advised her to call her insurance back since it was the new year and see if it is any cheaper. Patient understood and had no questions.

## 2017-07-11 NOTE — Telephone Encounter (Signed)
Tammy Lloyd

## 2017-07-13 ENCOUNTER — Telehealth: Payer: Self-pay

## 2017-07-13 NOTE — Telephone Encounter (Signed)
Help her out with samples.  It should be cheaper as of January

## 2017-07-13 NOTE — Telephone Encounter (Signed)
Patient came in today to pick up the samples of Xarelto 15 mg. She states she contact her insurance company for 2019 as you asked and the cost of the medication is still the same. $433 for 3 months. She is unable to afford this, she would like to switch to something else if possible.   Thank you!

## 2017-07-16 MED ORDER — RIVAROXABAN 15 MG PO TABS: ORAL_TABLET | ORAL | 0 refills | Status: DC

## 2017-07-16 NOTE — Telephone Encounter (Signed)
Pt was given 3 samples of Xarelto 15mg  Lot # [id number] 06/2019. Placed at front desk for pick up.  Tammy Lloyd

## 2017-08-23 ENCOUNTER — Other Ambulatory Visit: Payer: Self-pay | Admitting: Family Medicine

## 2017-08-28 DIAGNOSIS — H35372 Puckering of macula, left eye: Secondary | ICD-10-CM | POA: Diagnosis not present

## 2017-08-28 DIAGNOSIS — C441191 Basal cell carcinoma of skin of left upper eyelid, including canthus: Secondary | ICD-10-CM | POA: Diagnosis not present

## 2017-08-28 DIAGNOSIS — H353132 Nonexudative age-related macular degeneration, bilateral, intermediate dry stage: Secondary | ICD-10-CM | POA: Diagnosis not present

## 2017-08-28 DIAGNOSIS — Z961 Presence of intraocular lens: Secondary | ICD-10-CM | POA: Diagnosis not present

## 2017-09-12 ENCOUNTER — Encounter: Payer: Self-pay | Admitting: Adult Health

## 2017-09-12 ENCOUNTER — Ambulatory Visit (INDEPENDENT_AMBULATORY_CARE_PROVIDER_SITE_OTHER): Payer: Medicare Other | Admitting: Adult Health

## 2017-09-12 VITALS — BP 160/67 | HR 62 | Wt 152.0 lb

## 2017-09-12 DIAGNOSIS — Z8673 Personal history of transient ischemic attack (TIA), and cerebral infarction without residual deficits: Secondary | ICD-10-CM

## 2017-09-12 NOTE — Progress Notes (Signed)
PATIENT: Tammy Lloyd DOB: [birth year]  REASON FOR VISIT: follow up HISTORY FROM: patient  HISTORY OF PRESENT ILLNESS: Today 09/12/17 Tammy Lloyd is a 84 year old female with a history of TIA is not.  She returns today for follow-up.  She reports overall she is doing well.  She denies any strokelike symptoms.  She continues on Xarelto.  She has regular follow-ups with her primary care provider.  Her blood pressure is slightly elevated today.  She reports that she does try to check it at home.  She states that she lives at home alone.  She is able to complete all ADLs independently.  She operates a Teacher, music without difficulty.  She returns today for an evaluation.  HISTORY  03/08/17 Tammy Lloyd is a 84 year old female with a history of TIA event. She returns today for follow-up. She remains on Xarelto and is tolerating it well. Denies any significant bruising or bleeding. Her blood pressure is slightly elevated today. She states that she does check it at home and takes her medication regularly. She reports that her primary care is managing her blood pressure and cholesterol. She reports that she is not diabetic. She denies any additional strokelike symptoms. She returns today for an evaluation.  HISTORY 09/06/16: [name] is a pleasant 84 year old Caucasian lady seen today for first office follow-up visit following admission for TIA in January 2018. She is accompanied by her daughter and history is obtain from both of them as well as review of hospital records and have personally reviewed imaging films. Tammy Lloyd an 84 y.o.femalewith a prior history of [name] presents with acute onset of dysphasia.She was at home cooking dinner when she had sudden onset of "trouble getting her words out". Her prior TIA had the same symptoms. She has a history of atrial fibrillation. When asked, she denies taking ASA, Plavix, Aggrenox or a blood thinner (specifically stated no to dabigatran, Eliquis, Xarelto  and Coumadin/warfarin). Given her aphasia, family wasunsure if her responses are reliable and were asked to locate her medications list to ensure that she is not on an anticoagulant in order to make decision to consent or not consent to tPA. The patient is unable to make complex medical decisions in the context of her aphasia. Daughter arrived to ED and verified that patient was not taking an anticoagulant; previously was on a DOAC but stopped taking on her own as she thought it might be causing side effect of itching.  LKN 5:50 PM. NIHSS = 2.The patient was administered IV t-PA 1/11/12018 at 2008. Shewas admitted to the neuro ICU for further evaluation and treatment.Patient's showed excellent clinical recovery. MRI scan in fact did not show an acute infarct explaining her symptoms but did show tiny infarcts in the right midbrain and possibly right pons which were felt to be embolic but not causing her clinical symptoms.. Echocardiogram showed normal ejection fraction. LDL cholesterol was elevated at 114 mg percent. Hemoglobin A1c was 5.2. CT angiogram of the head and neck were significant only for 60% right vertebral stenosis which was asymptomatic. Patient had known history of atrial fibrillation but had been only on aspirin. She was switched to Xarelto for secondary stroke prevention discharge home. Patient has done well since discharge. She is tolerating Xarelto well without bleeding or bruising. She states her blood pressure is well controlled at home but today it is slightly elevated at 145/6 fine office. She is using a cane mostly and uses a walker for long distances. She is  not no recent falls or injuries. She is bothered by her knee causing a lot of pain but she does not want knee replacement surgery at her age.  REVIEW OF SYSTEMS: Out of a complete 14 system review of symptoms, the patient complains only of the following symptoms, and all other reviewed systems are negative.  See  HPI  ALLERGIES: Allergies  Allergen Reactions  . Tape Other (See Comments)    Skin is thin and may tear or bruise easily    HOME MEDICATIONS: Outpatient Medications Prior to Visit  Medication Sig Dispense Refill  . acetaminophen (TYLENOL) 325 MG tablet Take 650 mg by mouth every 6 (six) hours as needed for headache (or pain). Reported on 10/14/2015    . amLODipine (NORVASC) 5 MG tablet TAKE 1 TABLET BY MOUTH  DAILY 90 tablet 0  . clobetasol cream (TEMOVATE) 0.05 % Apply small amount nightly as needed for irritation 30 g 1  . hydrALAZINE (APRESOLINE) 10 MG tablet TAKE 1 TABLET BY MOUTH TWO  TIMES DAILY 180 tablet 1  . losartan-hydrochlorothiazide (HYZAAR) 100-25 MG tablet Take 1 tablet by mouth daily. 90 tablet 1  . Multiple Vitamins-Minerals (PRESERVISION AREDS PO) Take 1 capsule by mouth 2 (two) times daily.     . pravastatin (PRAVACHOL) 20 MG tablet Take 1 tablet (20 mg total) by mouth at bedtime. 90 tablet 3  . Rivaroxaban (XARELTO) 15 MG TABS tablet TAKE 1 TABLET BY MOUTH  DAILY WITH SUPPER 21 tablet 0   No facility-administered medications prior to visit.     PAST MEDICAL HISTORY: Past Medical History:  Diagnosis Date  . Arthritis   . Cancer ([place])    Breast  . Cataract   . Colon polyp   . Edema of both legs   . Hemochromatosis    Followed by  hematology;  . Hemorrhoid   . Hypertension   . Idiopathic hemochromatosis ([place]) 09/28/2011  . Lichen sclerosus   . Non-obstructive CAD (coronary artery disease)    a. 10/2011 [facility]): LM nl, LAD 5/10ost, 10/15p, D1 nl, LCX 10/15ost, OM1/2 small, OM3 nl, RCA large/nl, RPDA/RPLV nl, EF 55-60%.  . Osteoporosis 2005   DEXA 2015 T score -1.7 stable from prior DEXA 2011 history of Fosamax 8 years. On drug-free holiday now  . Persistent atrial fibrillation ([facility])    a. CHA2DS2VASc = 5 -->Xarelto;  b. 07/2016 Echo: EF 55-60%, no rwma, mild MR, mild biatrial enlargement.  . Pulmonary hypertension ([place])   . Stroke [facility])    a. 07/2016  MRI: punctate acute ischemia in the R mid brain and possibly within the right pons w/o assoc hemorrhage or mass effect. Chronic microvascular ischemia.    PAST SURGICAL HISTORY: Past Surgical History:  Procedure Laterality Date  . BACK SURGERY  2008  . BREAST LUMPECTOMY     Right  . CATARACT EXTRACTION, BILATERAL    . COLONOSCOPY  2009  . DILATION AND CURETTAGE OF UTERUS  2005  . HYSTEROSCOPY  08/2003  . LEFT HEART CATHETERIZATION WITH CORONARY ANGIOGRAM N/A 10/24/2011   Procedure: LEFT HEART CATHETERIZATION WITH CORONARY ANGIOGRAM;  Surgeon: Clent Demark, MD;  Location: [place] CATH LAB;  Service: Cardiovascular;  Laterality: N/A;  . TOTAL KNEE ARTHROPLASTY  2006    FAMILY HISTORY: Family History  Problem Relation Age of Onset  . Cancer Mother 32       GYN cancer (had hysterectomy)  . Heart disease Mother        CHF  . Cancer Father  liver  . Asthma Sister   . Breast cancer Sister 51  . Heart disease Brother        rheumatic heart attack  . Breast cancer Sister 74  . Asthma Brother   . Cancer Brother        Prostate  . Cancer Brother        Prostate    SOCIAL HISTORY: Social History   Socioeconomic History  . Marital status: Widowed    Spouse name: Not on file  . Number of children: Not on file  . Years of education: Not on file  . Highest education level: Not on file  Social Needs  . Financial resource strain: Not on file  . Food insecurity - worry: Not on file  . Food insecurity - inability: Not on file  . Transportation needs - medical: Not on file  . Transportation needs - non-medical: Not on file  Occupational History  . Not on file  Tobacco Use  . Smoking status: Never Smoker  . Smokeless tobacco: Never Used  Substance and Sexual Activity  . Alcohol use: No    Alcohol/week: 0.0 oz  . Drug use: No  . Sexual activity: No    Birth control/protection: Post-menopausal    Comment: 1st intercourse 84 yo-Fewer than 5 partners  Other Topics Concern   . Not on file  Social History Narrative   09/12/17 Lives alone. No pets      PHYSICAL EXAM  Vitals:   09/12/17 1021  BP: (!) 160/67  Pulse: 62  Weight: 152 lb (68.9 kg)   Body mass index is 26.09 kg/m.  Generalized: Well developed, in no acute distress   Neurological examination  Mentation: Alert oriented to time, place, history taking. Follows all commands speech and language fluent Cranial nerve II-XII: Pupils were equal round reactive to light. Extraocular movements were full, visual field were full on confrontational test. Facial sensation and strength were normal. Uvula tongue midline. Head turning and shoulder shrug  were normal and symmetric. Motor: The motor testing reveals 5 over 5 strength of all 4 extremities. Good symmetric motor tone is noted throughout.  Sensory: Sensory testing is intact to soft touch on all 4 extremities. No evidence of extinction is noted.  Coordination: Cerebellar testing reveals good finger-nose-finger and heel-to-shin bilaterally.  Gait and station: Patient uses a cane when ambulating.  Gait is slightly unsteady.  Tandem gait not attempted. Reflexes: Deep tendon reflexes are symmetric and normal bilaterally.   DIAGNOSTIC DATA (LABS, IMAGING, TESTING) - I reviewed patient records, labs, notes, testing and imaging myself where available.  Lab Results  Component Value Date   WBC 9.7 12/28/2016   HGB 11.8 12/28/2016   HCT 35.4 12/28/2016   MCV 97.0 12/28/2016   PLT 305 12/28/2016      Component Value Date/Time   NA 135 12/28/2016 1321   NA 139 06/10/2013 0936   K 4.8 12/28/2016 1321   K 4.7 06/10/2013 0936   CL 102 12/28/2016 1321   CL 106 09/30/2012 0913   CO2 23 12/28/2016 1321   CO2 24 06/10/2013 0936   GLUCOSE 95 12/28/2016 1321   GLUCOSE 101 06/10/2013 0936   GLUCOSE 95 09/30/2012 0913   BUN 26 (H) 12/28/2016 1321   BUN 27.8 (H) 06/10/2013 0936   CREATININE 1.12 (H) 12/28/2016 1321   CREATININE 1.2 (H) 06/10/2013 0936    CALCIUM 9.2 12/28/2016 1321   CALCIUM 9.4 06/10/2013 0936   PROT 6.6 12/28/2016 1321   PROT  6.8 01/02/2013 0918   ALBUMIN 4.0 12/28/2016 1321   ALBUMIN 3.5 01/02/2013 0918   AST 14 12/28/2016 1321   AST 13 01/02/2013 0918   ALT 9 12/28/2016 1321   ALT 7 01/02/2013 0918   ALKPHOS 69 12/28/2016 1321   ALKPHOS 77 01/02/2013 0918   BILITOT 0.5 12/28/2016 1321   BILITOT 0.31 01/02/2013 0918   GFRNONAA 51 (L) 07/22/2016 0230   GFRAA 59 (L) 07/22/2016 0230   Lab Results  Component Value Date   CHOL 139 12/28/2016   HDL 59 12/28/2016   LDLCALC 60 12/28/2016   TRIG 99 12/28/2016   CHOLHDL 2.4 12/28/2016   Lab Results  Component Value Date   HGBA1C 5.2 07/21/2016   Lab Results  Component Value Date   VITAMINB12 304 01/08/2007   Lab Results  Component Value Date   TSH 4.574 (H) 10/06/2013      ASSESSMENT AND PLAN 84 y.o. year old female  has a past medical history of Arthritis, Cancer ([place]), Cataract, Colon polyp, Edema of both legs, Hemochromatosis, Hemorrhoid, Hypertension, Idiopathic hemochromatosis ([place]) (1/74/0814), Lichen sclerosus, Non-obstructive CAD (coronary artery disease), Osteoporosis (2005), Persistent atrial fibrillation ([place]), Pulmonary hypertension ([place]), and Stroke ([place]). here with:  1.  History of TIA event  Overall the patient has been doing well.  She has not developed any additional strokelike symptoms.  She continues on Xarelto.  She is encouraged to maintain strict control of her blood pressure with goal less than 130/90, cholesterol LDL less than 70 and hemoglobin A1c less than 6.5%.  She is encouraged to continue regular follow-ups with her primary care provider.  She will follow-up with our office on an as-needed basis.  I spent 15 minutes with the patient. 50% of this time was spent discussing her symptoms and plan of care  Ward Givens, MSN, NP-C 09/12/2017, 10:28 AM [facility] [address] [place], [place]  [address] [phone]

## 2017-09-12 NOTE — Progress Notes (Signed)
I agree with the above plan 

## 2017-09-12 NOTE — Patient Instructions (Signed)
Your Plan:  Continue xarelto Blood Pressure <130/90 Cholesterol LDL <70 HgbA1c <6.Marland Kitchen5 % If you have any stroke like symptoms please call 911 If your symptoms worsen or you develop new symptoms please let us know.   Thank you for coming to see [place] at [facility]. I hope we have been able to provide you high quality care today.  You may receive a patient satisfaction survey over the next few weeks. We would appreciate your feedback and comments so that we may continue to improve ourselves and the health of our patients.

## 2017-09-27 ENCOUNTER — Other Ambulatory Visit: Payer: Self-pay | Admitting: Family Medicine

## 2017-10-02 DIAGNOSIS — C441191 Basal cell carcinoma of skin of left upper eyelid, including canthus: Secondary | ICD-10-CM | POA: Diagnosis not present

## 2017-10-02 DIAGNOSIS — D485 Neoplasm of uncertain behavior of skin: Secondary | ICD-10-CM | POA: Diagnosis not present

## 2017-10-02 HISTORY — PX: OTHER SURGICAL HISTORY: SHX169

## 2017-10-19 ENCOUNTER — Encounter: Payer: Self-pay | Admitting: Family Medicine

## 2017-10-23 ENCOUNTER — Other Ambulatory Visit: Payer: Self-pay | Admitting: Family Medicine

## 2017-10-30 DIAGNOSIS — C441191 Basal cell carcinoma of skin of left upper eyelid, including canthus: Secondary | ICD-10-CM | POA: Diagnosis not present

## 2017-11-02 ENCOUNTER — Other Ambulatory Visit: Payer: Self-pay

## 2017-11-02 MED ORDER — PREDNICARBATE 0.1 % EX CREA: TOPICAL_CREAM | CUTANEOUS | 0 refills | Status: DC

## 2017-11-05 ENCOUNTER — Telehealth: Payer: Self-pay | Admitting: *Deleted

## 2017-11-05 MED ORDER — CLOBETASOL PROPIONATE 0.05 % EX CREA: TOPICAL_CREAM | CUTANEOUS | 1 refills | Status: DC

## 2017-11-05 NOTE — Telephone Encounter (Signed)
Any recommendations for substitute equivalent from the pharmacist  If not then clobetasol 0.05% cream

## 2017-11-05 NOTE — Telephone Encounter (Signed)
[place] pharmacist from [facility] called stating they no longer have prednicarbate 0.1% cream for itching. It appears she has used clobetasol cream 0.05 % in past. Please advise

## 2017-11-05 NOTE — Telephone Encounter (Signed)
Rx sent for clobetasol cream

## 2018-01-19 ENCOUNTER — Other Ambulatory Visit: Payer: Self-pay | Admitting: Family Medicine

## 2018-01-19 DIAGNOSIS — E785 Hyperlipidemia, unspecified: Secondary | ICD-10-CM

## 2018-01-19 DIAGNOSIS — Z8673 Personal history of transient ischemic attack (TIA), and cerebral infarction without residual deficits: Secondary | ICD-10-CM

## 2018-01-31 ENCOUNTER — Ambulatory Visit (INDEPENDENT_AMBULATORY_CARE_PROVIDER_SITE_OTHER): Payer: Medicare Other | Admitting: Family Medicine

## 2018-01-31 ENCOUNTER — Encounter: Payer: Self-pay | Admitting: Family Medicine

## 2018-01-31 ENCOUNTER — Other Ambulatory Visit: Payer: Self-pay | Admitting: Family Medicine

## 2018-01-31 VITALS — BP 128/64 | HR 58 | Temp 97.7°F | Wt 147.2 lb

## 2018-01-31 DIAGNOSIS — K4090 Unilateral inguinal hernia, without obstruction or gangrene, not specified as recurrent: Secondary | ICD-10-CM | POA: Diagnosis not present

## 2018-01-31 NOTE — Progress Notes (Signed)
  Subjective:     Patient ID: Tammy Lloyd, female   DOB: 02/15/1924, 84 y.o.   MRN: [id number]  Chief Complaint  Patient presents with  . other     noticed a bump on abd. six weeks ago pt says it not painful and it does not itch     HPI    Tammy Lloyd is a 85 y.o. female who presents for evaluation of a bump in the RLQ. The bump was noticed 5 weeks ago when she went to get up from bed. The bump feels hard. Bump has not changed over time. States she can only see the bump when standing and goes away when she lays down. No abdominal pain, nausea, vomiting patient denies pain, injury, recent falls. Patient has not had bumps like this in the past.   Reports she has been losing weight over the past couple years.  Used to be 208, now 147lbs. No new exposures to medications, soaps, detergents, foods, or insects.        Objective:   BP 128/64 (BP Location: Left Arm, Patient Position: Sitting)   Pulse (!) 58   Temp 97.7 F (36.5 C)   Wt 147 lb 3.2 oz (66.8 kg)   SpO2 98%   BMI 25.27 kg/m   Alert and in no distress.  Abdominal exam does show a 5 cm round smooth nontender lesion in the right inguinal area that did disappear when she was in the supine position.      Assessment & Plan   Right inguinal hernia:  Have patient follow up with a surgeon to evaluate for surgery or other options.

## 2018-02-11 DIAGNOSIS — K4090 Unilateral inguinal hernia, without obstruction or gangrene, not specified as recurrent: Secondary | ICD-10-CM | POA: Diagnosis not present

## 2018-02-18 ENCOUNTER — Other Ambulatory Visit: Payer: Self-pay | Admitting: Family Medicine

## 2018-02-18 NOTE — Telephone Encounter (Signed)
Pt has not had any labs for xarelto. Please advise if we can fill . Thanks [employer]

## 2018-02-25 ENCOUNTER — Telehealth: Payer: Self-pay | Admitting: Family Medicine

## 2018-02-25 NOTE — Telephone Encounter (Signed)
Pt called and states you were supposed to talk with the surgeon about pt coming off of Xarelto.  They will not schedule hernia  surgery until this is done.  Please advise pt. [phone]

## 2018-02-26 NOTE — Telephone Encounter (Signed)
Fax a note over okay to do surgery and hold Xarelto 2 days prior and start the next day after surgery.

## 2018-02-26 NOTE — Telephone Encounter (Signed)
She needs to continue on Xarelto but can stop it 1 day prior to surgery.  Find out who her surgeon is so we can relay that message to the surgeon.  She can then start back on Xarelto the day after her surgery.

## 2018-02-26 NOTE — Telephone Encounter (Signed)
Tammy Lloyd called back, I advised her to stop Xarelto 1 day before surgery and start back day after.  Tammy Lloyd states her surgeon Dr. Kristie Cowman [phone] advised 2 days.  Please call surgeon to discuss and then advise Tammy Lloyd.

## 2018-02-26 NOTE — Telephone Encounter (Signed)
To Dr Redmond School

## 2018-02-27 NOTE — Telephone Encounter (Signed)
Spoke to [place] at Dr. Jerrell Belfast office and advised office per Dr. Redmond School to stop Xarelto 2 days prior to surgery and resume next day after . Will also call pt to advise [facility]

## 2018-02-27 NOTE — Telephone Encounter (Signed)
Spoke with Cayley and she is aware.  She is awaiting for surgery to be scheduled.

## 2018-03-04 ENCOUNTER — Ambulatory Visit: Payer: Self-pay | Admitting: Surgery

## 2018-03-31 ENCOUNTER — Other Ambulatory Visit: Payer: Self-pay | Admitting: Family Medicine

## 2018-04-05 ENCOUNTER — Other Ambulatory Visit (HOSPITAL_COMMUNITY): Payer: Medicare Other

## 2018-04-09 ENCOUNTER — Telehealth: Payer: Self-pay | Admitting: Family Medicine

## 2018-04-09 NOTE — Telephone Encounter (Signed)
Called pharmacy and Per Dr. Redmond School pt can switch to valsartan /hctz 160/12.5mg . Pt was called to inform her of change in med but no answer LVM. [place]

## 2018-04-09 NOTE — Telephone Encounter (Signed)
[facility] has also sent a fax stating Losartan/HCTZ is currently out of stock and that patient is requesting an alternative. Fax is in the folder of other medications in the same class that are out of stock.

## 2018-04-09 NOTE — Telephone Encounter (Signed)
Patient called and left message on voice mail that there is a manufacturers back or on her Losartan HCTZ And she is down to 6 pills left.  Please advise pt what to do [phone]

## 2018-04-09 NOTE — Telephone Encounter (Signed)
See if any of the drugstore has this possibly split the dosing up

## 2018-04-11 ENCOUNTER — Ambulatory Visit (HOSPITAL_COMMUNITY): Admit: 2018-04-11 | Payer: Medicare Other | Admitting: Surgery

## 2018-04-11 ENCOUNTER — Encounter (HOSPITAL_COMMUNITY): Payer: Self-pay

## 2018-04-11 SURGERY — REPAIR, HERNIA, INGUINAL, LAPAROSCOPIC
Anesthesia: General | Laterality: Right

## 2018-04-20 ENCOUNTER — Other Ambulatory Visit: Payer: Self-pay | Admitting: Family Medicine

## 2018-04-25 ENCOUNTER — Other Ambulatory Visit: Payer: Self-pay

## 2018-04-25 DIAGNOSIS — E785 Hyperlipidemia, unspecified: Secondary | ICD-10-CM

## 2018-04-25 DIAGNOSIS — Z8673 Personal history of transient ischemic attack (TIA), and cerebral infarction without residual deficits: Secondary | ICD-10-CM

## 2018-04-25 MED ORDER — PRAVASTATIN SODIUM 20 MG PO TABS: 20.0000 mg | ORAL_TABLET | Freq: Every day | ORAL | 0 refills | Status: DC

## 2018-04-25 NOTE — Telephone Encounter (Signed)
Patient has requested a refill on the following medication.

## 2018-04-29 ENCOUNTER — Telehealth: Payer: Self-pay | Admitting: Family Medicine

## 2018-04-29 DIAGNOSIS — Z8673 Personal history of transient ischemic attack (TIA), and cerebral infarction without residual deficits: Secondary | ICD-10-CM

## 2018-04-29 DIAGNOSIS — E785 Hyperlipidemia, unspecified: Secondary | ICD-10-CM

## 2018-04-29 MED ORDER — PRAVASTATIN SODIUM 20 MG PO TABS: 20.0000 mg | ORAL_TABLET | Freq: Every day | ORAL | 0 refills | Status: DC

## 2018-04-29 NOTE — Telephone Encounter (Signed)
Recv'd fax from [facility] pt needs refill Pravastatin to [facility] 90 days

## 2018-05-06 IMAGING — CT CT HEAD CODE STROKE
3 series · 15 of 47 positions shown, 18 images · non-contrast
Comparison: 10/06/2013 MRI brain.  10/05/2013 CT head.

ADDENDUM:
These results were called by telephone at the time of interpretation
on 07/20/2016 at [DATE] to Dr. Loredo, who verbally acknowledged
these results.

By: Mofidul Tiger M.D.
CLINICAL DATA: Code stroke. Sudden onset trouble speaking. History
of TIA.
EXAM:
CT HEAD WITHOUT CONTRAST
TECHNIQUE: Contiguous axial images were obtained from the base of the skull
through the vertex without intravenous contrast.

[Series 2: head 5.0 st · axial · 0.43mm/px · z∈[+606,+741]mm · 9 of 33 slices shown, 12 images]
[im 3/33  brain]
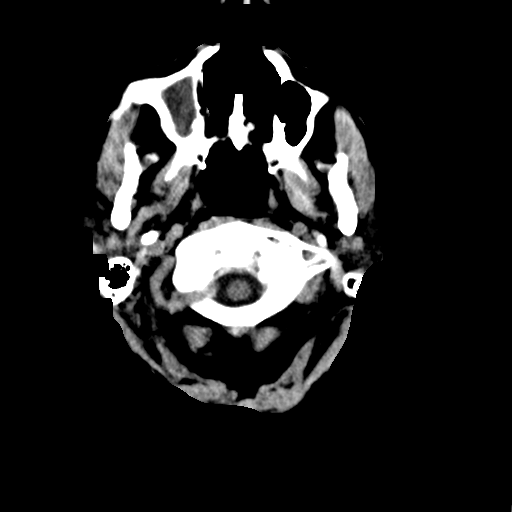
[im 3/33  bone]
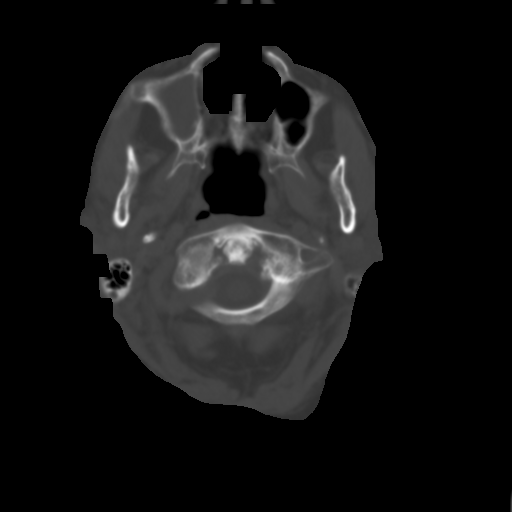
[im 6/33  brain]
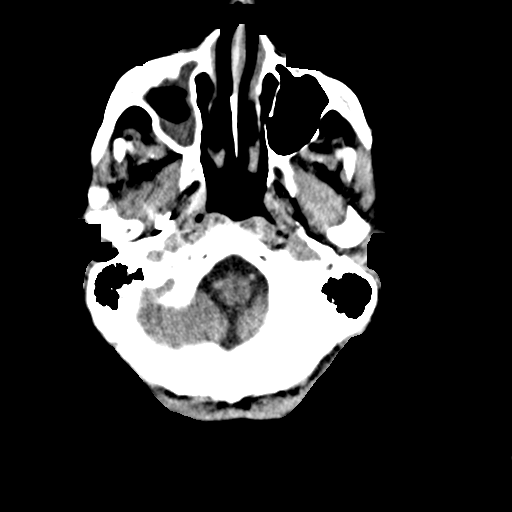
[im 9/33  brain]
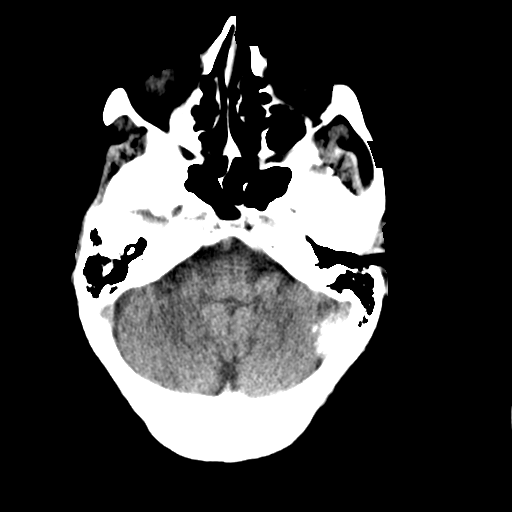
[im 13/33  brain]
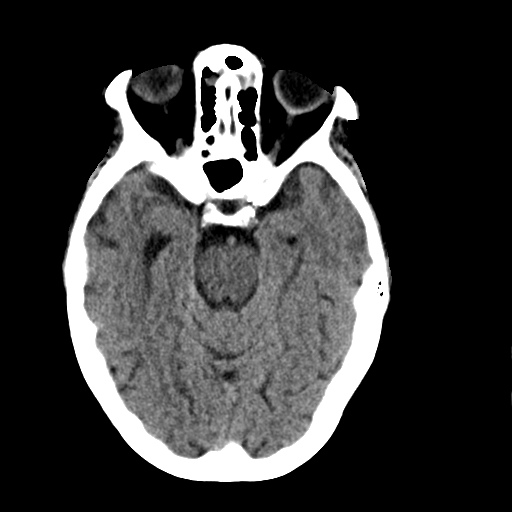
[im 17/33  brain]
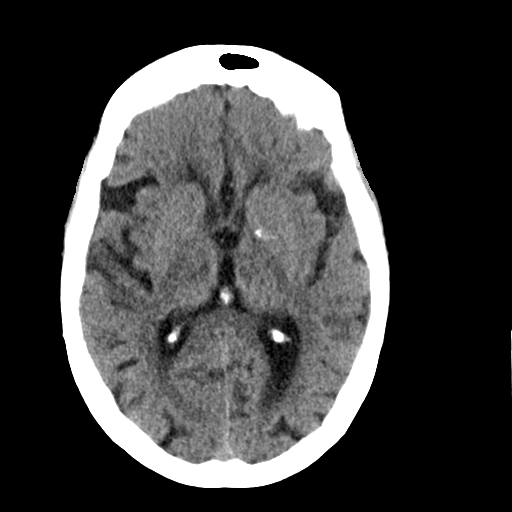
[im 17/33  bone]
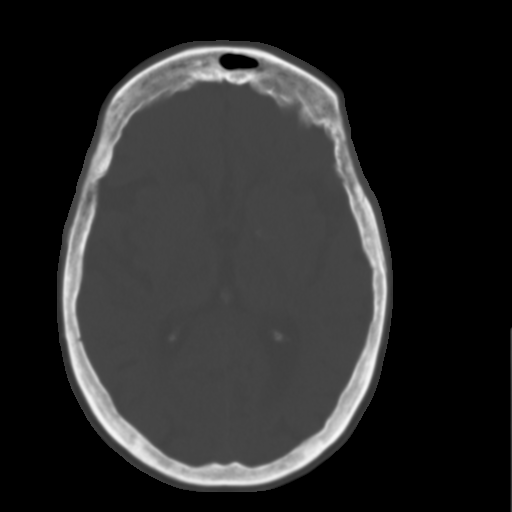
[im 20/33  brain]
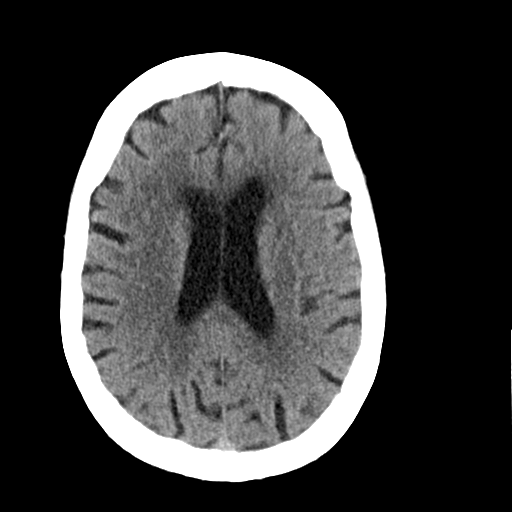
[im 24/33  brain]
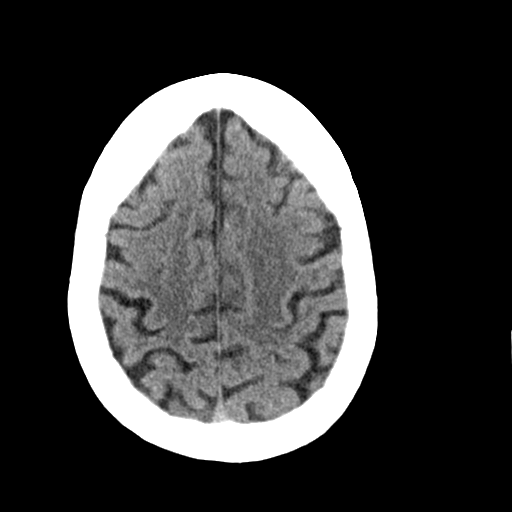
[im 27/33  brain]
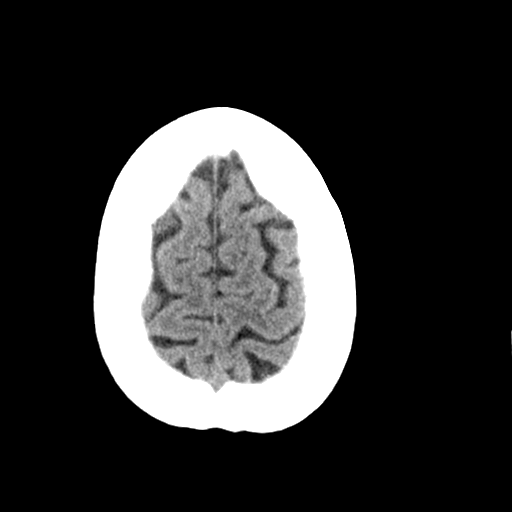
[im 30/33  brain]
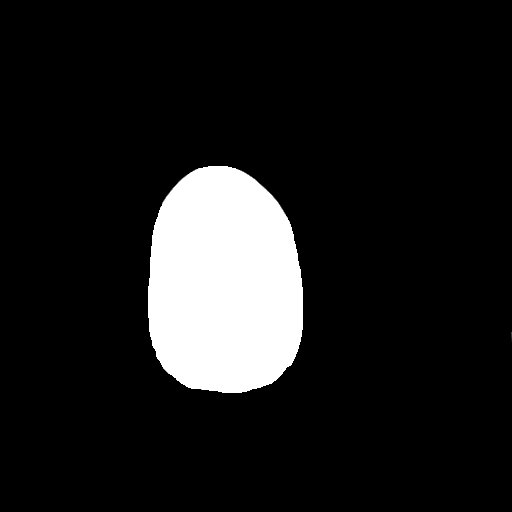
[im 30/33  bone]
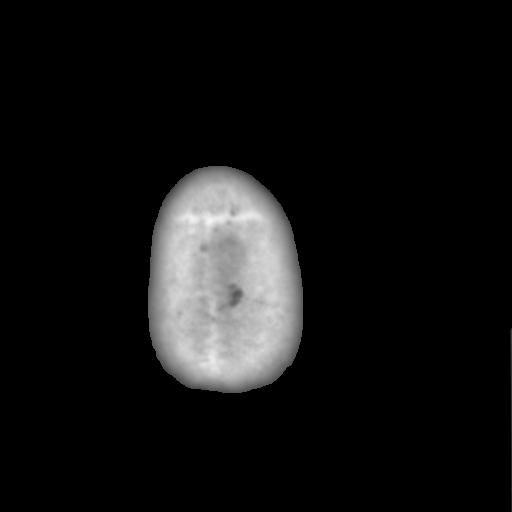

[Series 4: head 3.0 cor st · coronal · 0.30mm/px · 3 of 67 slices shown]
[im 23/67  brain]
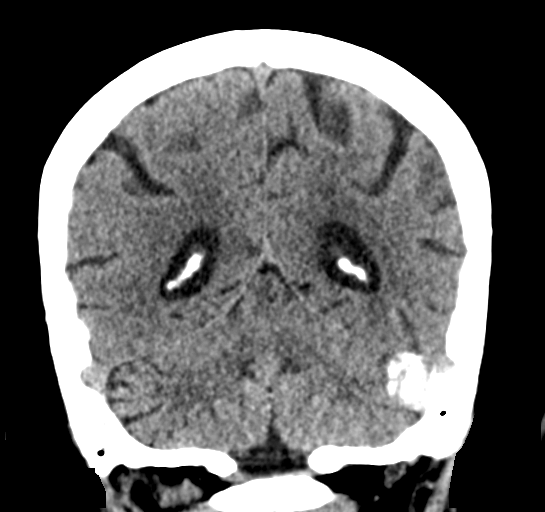
[im 30/67  brain]
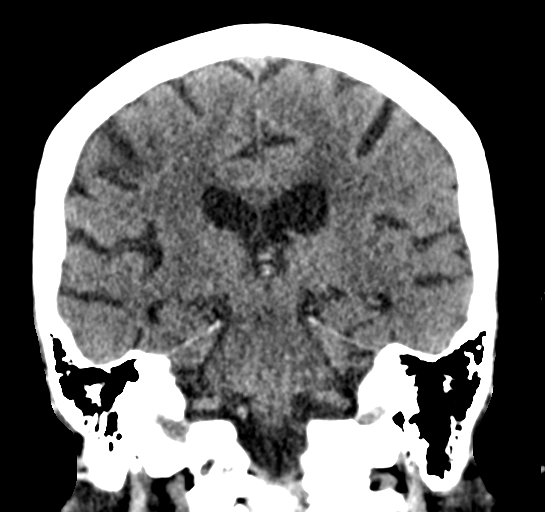
[im 37/67  brain]
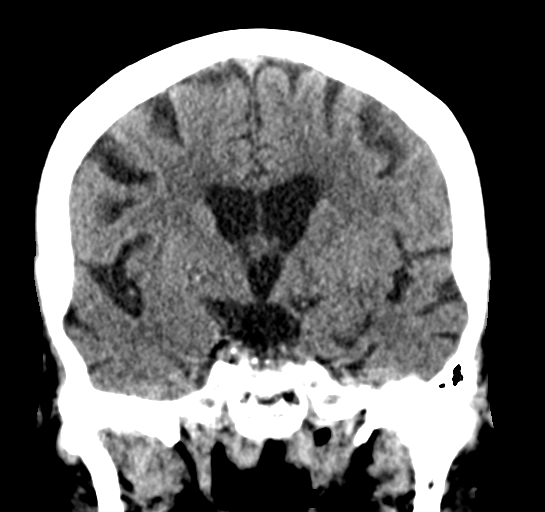

[Series 5: head 3.0 sag st · sagittal · 0.32mm/px · 3 of 52 slices shown]
[im 18/52  brain]
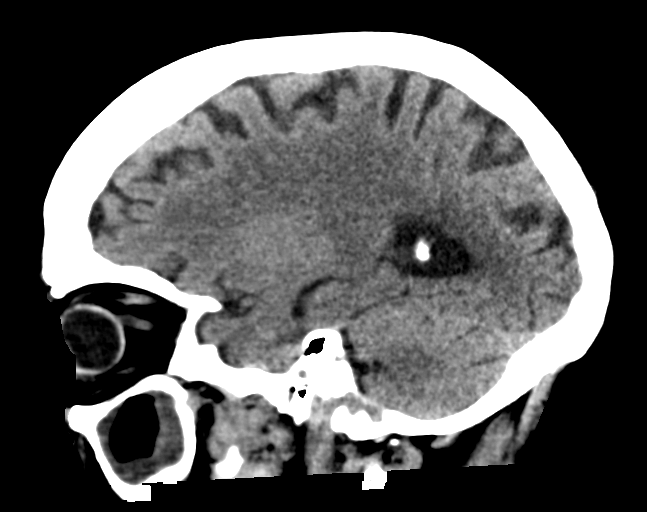
[im 26/52  brain]
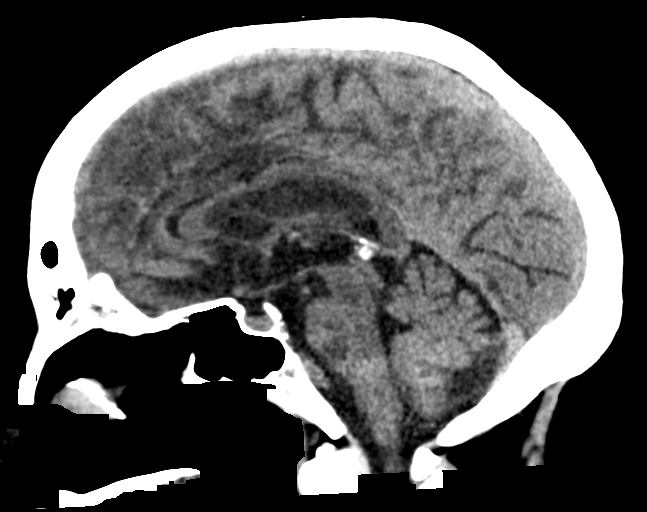
[im 35/52  brain]
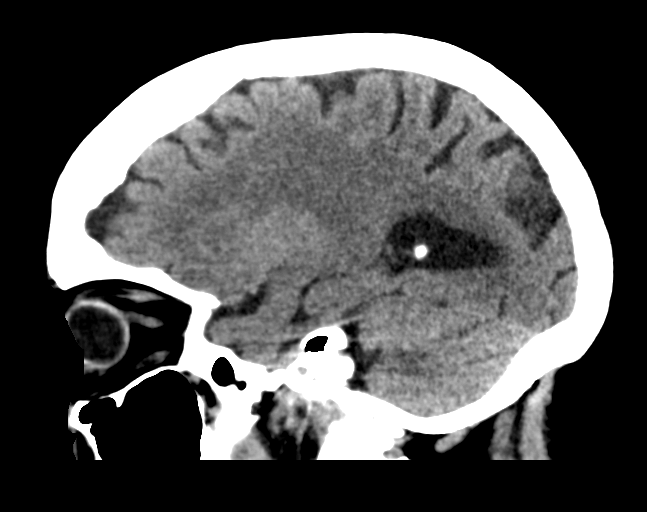

[15 of 47 positions shown; findings below may reference images not displayed]

FINDINGS: Brain: No evidence of acute infarction, hemorrhage, hydrocephalus,
extra-axial collection or mass lesion/mass effect of the brain.
Chronic left thalamus lacunar infarct. New lucency in right
thalamus. Mild chronic microvascular ischemic changes and
parenchymal volume loss of the brain. Set

Extra-axial calcified mass lateral to the left cerebellar hemisphere
is stable measuring 26 x 18 mm axially (series 2, image 11). This
likely represents a meningioma. There is minimal left-to-right
midline shift of the posterior fossa due to mass effect from the
extra-axial lesion.

Vascular: No hyperdense vessel. Moderate calcific atherosclerosis of
cavernous internal carotid arteries.

Skull: Normal. Negative for fracture or focal lesion.

Sinuses/Orbits: Mucous retention cysts within the bilateral frontal
sinus antrum. Right maxillary opacification with a fluid level.

Other: None.

ASPECTS (Alberta Stroke Program Early CT Score)

- Ganglionic level infarction (caudate, lentiform nuclei, internal
capsule, insula, M1-M3 cortex): 7

- Supraganglionic infarction (M4-M6 cortex): 3

Total score (0-10 with 10 being normal): 10
IMPRESSION: 1. No evidence of large territory acute infarct, hemorrhage, or new
focal mass effect.
2. New lucency in right thalamus is probably an interval chronic
lacunar infarct.
3. ASPECTS is 10
4. Stable calcified dural-based mass in the left posterior fossa
probably representing meningioma.

By: Mofidul Tiger M.D.

## 2018-06-10 DIAGNOSIS — Z1231 Encounter for screening mammogram for malignant neoplasm of breast: Secondary | ICD-10-CM | POA: Diagnosis not present

## 2018-06-10 DIAGNOSIS — Z853 Personal history of malignant neoplasm of breast: Secondary | ICD-10-CM | POA: Diagnosis not present

## 2018-06-10 LAB — HM MAMMOGRAPHY

## 2018-07-12 ENCOUNTER — Ambulatory Visit (INDEPENDENT_AMBULATORY_CARE_PROVIDER_SITE_OTHER): Payer: Medicare Other | Admitting: Gynecology

## 2018-07-12 ENCOUNTER — Encounter: Payer: Self-pay | Admitting: Gynecology

## 2018-07-12 VITALS — BP 140/66 | Ht 64.0 in | Wt 147.0 lb

## 2018-07-12 DIAGNOSIS — Z01419 Encounter for gynecological examination (general) (routine) without abnormal findings: Secondary | ICD-10-CM | POA: Diagnosis not present

## 2018-07-12 DIAGNOSIS — M810 Age-related osteoporosis without current pathological fracture: Secondary | ICD-10-CM

## 2018-07-12 DIAGNOSIS — Z9289 Personal history of other medical treatment: Secondary | ICD-10-CM

## 2018-07-12 DIAGNOSIS — L900 Lichen sclerosus et atrophicus: Secondary | ICD-10-CM

## 2018-07-12 DIAGNOSIS — N952 Postmenopausal atrophic vaginitis: Secondary | ICD-10-CM

## 2018-07-12 NOTE — Progress Notes (Signed)
    MISBAH HORNADAY 1924-03-20 [id number]        85 y.o.  G1P1 for breast and pelvic exam.  Without gynecologic complaints  Past medical history,surgical history, problem list, medications, allergies, family history and social history were all reviewed and documented as reviewed in the EPIC chart.  ROS:  Performed with pertinent positives and negatives included in the history, assessment and plan.   Additional significant findings : None   Exam: Caryn Bee assistant Vitals:   07/12/18 0952  BP: 140/66  Weight: 147 lb (66.7 kg)  Height: 5\' 4"  (1.626 m)   Body mass index is 25.23 kg/m.  General appearance:  Normal affect, orientation and appearance. Skin: Grossly normal HEENT: Without gross lesions.  No cervical or supraclavicular adenopathy. Thyroid normal.  Lungs:  Clear without wheezing, rales or rhonchi Cardiac: RR, without RMG Abdominal:  Soft, nontender, without masses, guarding, rebound, organomegaly or hernia Breasts:  Examined lying and sitting without masses, retractions, discharge or axillary adenopathy. Pelvic:  Ext, BUS, Vagina: With atrophic changes.  Perivaginal blanching consistent with history of lichen sclerosis.  Cervix: Unable to visualize  Uterus: Unable to palpate but no gross masses or tenderness on bimanual  Adnexa: Without masses or tenderness    Anus and perineum: Normal   Rectovaginal: Normal sphincter tone without palpated masses or tenderness.    Assessment/Plan:  85 y.o. G1P1 female for breast and pelvic exam.   1. Postmenopausal/atrophic genital changes.  No significant menopausal symptoms or any vaginal bleeding. 2. History of lichen sclerosis.  Exam consistent with this.  No concerning findings on exam.  Uses prenicrbate 0.1% cream intermittently as needed for irritation.  Has supply but will call if needs more. 3. Mammography yesterday.  Continue with annual mammography next year.  Breast exam normal today. 4. Colonoscopy 2009.  Colon screening  per primary physicians recommendations. 5. Pap smear 2008.  No Pap smear done today.  No history of significant abnormal Pap smears.  We both agree to stop screening per current screening guidelines. 6. Osteoporosis.  DEXA 2015 T score -1.7.  Stable from prior DEXA.  History of Fosamax for 8 years on drug-free holiday.  We again discussed repeating her bone density and she is reluctant to do so.  Would not take medication regardless. 7. Health maintenance.  No routine lab work done as patient does this elsewhere.  Follow-up 1 year, sooner as needed.   Anastasio Auerbach MD, 10:09 AM 07/12/2018

## 2018-07-12 NOTE — Patient Instructions (Signed)
Follow-up in 1 year, sooner if any problems

## 2018-07-23 ENCOUNTER — Other Ambulatory Visit: Payer: Self-pay | Admitting: Family Medicine

## 2018-07-23 DIAGNOSIS — Z8673 Personal history of transient ischemic attack (TIA), and cerebral infarction without residual deficits: Secondary | ICD-10-CM

## 2018-07-23 DIAGNOSIS — E785 Hyperlipidemia, unspecified: Secondary | ICD-10-CM

## 2018-07-25 ENCOUNTER — Telehealth: Payer: Self-pay | Admitting: Family Medicine

## 2018-07-25 NOTE — Telephone Encounter (Signed)
Pt called she states [facility] would not give her her prevastatin.  Called [facility] spoke with Patrick Jupiter and he said he can't release until 3/22 that someone else had already ran through the rx with insurance. I called [facility] spoke with Threasa Beards, she states rx was sent yesterday to pt and she should have by 8pm on Saturday. Called pt advised of same.  She states she prefers to get it from [place].

## 2018-07-29 ENCOUNTER — Telehealth: Payer: Self-pay | Admitting: Family Medicine

## 2018-07-29 NOTE — Telephone Encounter (Signed)
Pt called, she did not recv the Prevastatin on Saturday.  Called mail order spoke with Angie, she states she can see the tracking that the med is in [place] as of yesterday.  PO closed today.  Pt should have tomorrow.  Called Chrisette advised of same, she has 6 tablets left.

## 2018-08-01 ENCOUNTER — Other Ambulatory Visit: Payer: Self-pay | Admitting: Family Medicine

## 2018-08-01 NOTE — Telephone Encounter (Signed)
[facility] is requesting to fill pt Xarelto. It has been a year since last labs. Please advise if med can be filled for pt. Thanks [employer]

## 2018-08-02 NOTE — Telephone Encounter (Signed)
Needs an appointment.

## 2018-08-02 NOTE — Telephone Encounter (Signed)
Set appt for may for her AWV can it be filled. Please advise [facility]

## 2018-08-03 NOTE — Telephone Encounter (Signed)
appt

## 2018-08-28 DIAGNOSIS — Z961 Presence of intraocular lens: Secondary | ICD-10-CM | POA: Diagnosis not present

## 2018-08-28 DIAGNOSIS — H35372 Puckering of macula, left eye: Secondary | ICD-10-CM | POA: Diagnosis not present

## 2018-08-28 DIAGNOSIS — H02834 Dermatochalasis of left upper eyelid: Secondary | ICD-10-CM | POA: Diagnosis not present

## 2018-08-28 DIAGNOSIS — H353130 Nonexudative age-related macular degeneration, bilateral, stage unspecified: Secondary | ICD-10-CM | POA: Diagnosis not present

## 2018-08-28 DIAGNOSIS — H02831 Dermatochalasis of right upper eyelid: Secondary | ICD-10-CM | POA: Diagnosis not present

## 2018-09-08 ENCOUNTER — Other Ambulatory Visit: Payer: Self-pay | Admitting: Family Medicine

## 2018-09-23 ENCOUNTER — Other Ambulatory Visit: Payer: Self-pay | Admitting: Family Medicine

## 2018-09-23 DIAGNOSIS — E785 Hyperlipidemia, unspecified: Secondary | ICD-10-CM

## 2018-09-23 DIAGNOSIS — Z8673 Personal history of transient ischemic attack (TIA), and cerebral infarction without residual deficits: Secondary | ICD-10-CM

## 2018-10-06 ENCOUNTER — Other Ambulatory Visit: Payer: Self-pay | Admitting: Family Medicine

## 2018-10-06 DIAGNOSIS — I10 Essential (primary) hypertension: Secondary | ICD-10-CM

## 2018-10-24 ENCOUNTER — Other Ambulatory Visit: Payer: Self-pay | Admitting: Family Medicine

## 2018-11-13 ENCOUNTER — Ambulatory Visit: Payer: Self-pay | Admitting: Family Medicine

## 2018-11-21 ENCOUNTER — Other Ambulatory Visit: Payer: Self-pay | Admitting: Family Medicine

## 2018-11-21 DIAGNOSIS — E785 Hyperlipidemia, unspecified: Secondary | ICD-10-CM

## 2018-11-21 DIAGNOSIS — Z8673 Personal history of transient ischemic attack (TIA), and cerebral infarction without residual deficits: Secondary | ICD-10-CM

## 2018-11-26 ENCOUNTER — Telehealth: Payer: Self-pay | Admitting: Family Medicine

## 2018-11-26 NOTE — Telephone Encounter (Signed)
Pt called and hasn't received her Xarelto.  I called [facility] and they shipped on 11/22/18.  Called pt and informed should arrive today.

## 2019-01-22 ENCOUNTER — Other Ambulatory Visit: Payer: Self-pay | Admitting: Family Medicine

## 2019-02-12 ENCOUNTER — Telehealth: Payer: Self-pay | Admitting: Family Medicine

## 2019-02-12 NOTE — Telephone Encounter (Signed)
Pt called, she has not received her Adralazine from [place].  I called [facility] they are getting it ready today, she should have it in 3 - 5 days.  Called pt back she states she ordered it a while ago and she is out.  Calling in 30 days worth to [facility].  Pt aware.

## 2019-02-17 ENCOUNTER — Other Ambulatory Visit: Payer: Self-pay | Admitting: Family Medicine

## 2019-02-25 ENCOUNTER — Other Ambulatory Visit: Payer: Self-pay

## 2019-02-25 ENCOUNTER — Ambulatory Visit (INDEPENDENT_AMBULATORY_CARE_PROVIDER_SITE_OTHER): Payer: Medicare Other | Admitting: Family Medicine

## 2019-02-25 ENCOUNTER — Encounter: Payer: Self-pay | Admitting: Family Medicine

## 2019-02-25 VITALS — BP 104/62 | HR 54 | Temp 97.5°F | Wt 138.4 lb

## 2019-02-25 DIAGNOSIS — M8580 Other specified disorders of bone density and structure, unspecified site: Secondary | ICD-10-CM | POA: Diagnosis not present

## 2019-02-25 DIAGNOSIS — I4891 Unspecified atrial fibrillation: Secondary | ICD-10-CM | POA: Diagnosis not present

## 2019-02-25 DIAGNOSIS — I2510 Atherosclerotic heart disease of native coronary artery without angina pectoris: Secondary | ICD-10-CM | POA: Diagnosis not present

## 2019-02-25 DIAGNOSIS — H3530 Unspecified macular degeneration: Secondary | ICD-10-CM | POA: Insufficient documentation

## 2019-02-25 DIAGNOSIS — R269 Unspecified abnormalities of gait and mobility: Secondary | ICD-10-CM | POA: Diagnosis not present

## 2019-02-25 DIAGNOSIS — Z853 Personal history of malignant neoplasm of breast: Secondary | ICD-10-CM

## 2019-02-25 DIAGNOSIS — Z8673 Personal history of transient ischemic attack (TIA), and cerebral infarction without residual deficits: Secondary | ICD-10-CM

## 2019-02-25 DIAGNOSIS — I10 Essential (primary) hypertension: Secondary | ICD-10-CM

## 2019-02-25 DIAGNOSIS — H9113 Presbycusis, bilateral: Secondary | ICD-10-CM | POA: Diagnosis not present

## 2019-02-25 NOTE — Progress Notes (Signed)
Tammy Lloyd is a 85 y.o. female who presents for annual wellness visit and follow-up on chronic medical conditions.  She has no particular concerns or complaints.  She continues on her blood pressure medications as well as Pravachol and Xarelto.  She does have a history of idiopathic hemochromatosis.  Also has underlying coronary artery disease with atrial fibrillation.  She does use a cane without difficulty.  She has seen neurology in the past for CVA but is having no neurologic symptoms at the present time.  Does wear hearing aids.  Also sees ophthalmology for treatment of macular degeneration.  Apparently her daughter will be moving in with her to help with her care. Immunizations and Health Maintenance Immunization History  Administered Date(s) Administered  . DTaP 01/19/1981, 06/20/1999  . PPD Test 01/19/1981  . Pneumococcal Conjugate-13 12/28/2016  . Pneumococcal Polysaccharide-23 12/27/2001, 02/03/2013  . Tdap 06/15/2016  . Zoster Recombinat (Shingrix) 02/12/2017, 06/29/2017   Health Maintenance Due  Topic Date Due  . INFLUENZA VACCINE  02/08/2019    Last Pap smear: aged out Last mammogram: 06-10-18 Last colonoscopy: unkown Last DEXA:05-14-14 Dentist:unknown Ophtho: 2020 Exercise staying active  Other doctors caring for patient include: Tyrone Schimke , [facility]  Advanced directives:yes  Copy asked for    Depression screen:  See questionnaire below.  Depression screen [facility] 2/9 02/25/2019 01/31/2018 12/28/2016 06/13/2016 02/23/2014  Decreased Interest 0 0 0 0 0  Down, Depressed, Hopeless 0 - 0 0 0  PHQ - 2 Score 0 0 0 0 0    Fall Risk Screen: see questionnaire below. Fall Risk  02/25/2019 01/31/2018 09/12/2017 03/08/2017 12/28/2016  Falls in the past year? 0 Yes No No No  Comment - - - - -  Number falls in past yr: - 2 or more - - -  Comment - - - - -  Injury with Fall? - No - - -  Risk for fall due to : - Impaired balance/gait - - -  Follow up - Falls prevention discussed  - - -    ADL screen:  See questionnaire below Functional Status Survey: Is the patient deaf or have difficulty hearing?: Yes(some times have hearing aide) Does the patient have difficulty seeing, even when wearing glasses/contacts?: No Does the patient have difficulty concentrating, remembering, or making decisions?: No Does the patient have difficulty walking or climbing stairs?: No Does the patient have difficulty dressing or bathing?: No Does the patient have difficulty doing errands alone such as visiting a doctor's office or shopping?: No   Review of Systems Constitutional: -, -unexpected weight change, -anorexia, -fatigue Allergy: -sneezing, -itching, -congestion Dermatology: denies changing moles, rash, lumps Gastroenterology: -abdominal pain, -nausea, -vomiting, -diarrhea, -constipation, -dysphagia Hematology: -bleeding or bruising problems Musculoskeletal: -arthralgias, -myalgias, -joint swelling, -back pain, - Urology: -dysuria, -difficulty urinating,  -urinary frequency, -urgency, incontinence Neurology: -, -numbness, , -memory loss, -falls, -dizziness    PHYSICAL EXAM:  BP 104/62 (BP Location: Left Arm, Patient Position: Sitting)   Pulse (!) 54   Temp (!) 97.5 F (36.4 C)   Wt 138 lb 6.4 oz (62.8 kg)   SpO2 97%   BMI 23.76 kg/m   General Appearance: Alert, cooperative, no distress, appears stated age Head: Normocephalic, without obvious abnormality, atraumatic Eyes: PERRL, conjunctiva/corneas clear, EOM's intact, fundi benign Ears: Normal TM's and external ear canals Nose: Nares normal, mucosa normal, no drainage or sinus tenderness Throat: Lips, mucosa, and tongue normal; teeth and gums normal Neck: Supple, no lymphadenopathy;  thyroid:  no enlargement/tenderness/nodules;  no carotid bruit or JVD Lungs: Clear to auscultation bilaterally without wheezes, rales or ronchi; respirations unlabored Heart: Regular rate and rhythm, S1 and S2 normal, no murmur, rubor  gallop Abdomen: Soft, non-tender, nondistended, normoactive bowel sounds,  no masses, no hepatosplenomegaly Extremities: No clubbing, cyanosis or edema Pulses: 2+ and symmetric all extremities Skin:  Skin color, texture, turgor normal, no rashes or lesions Lymph nodes: Cervical, supraclavicular, and axillary nodes normal Neurologic:  CNII-XII intact, normal strength, sensation and gait; reflexes 2+ and symmetric throughout Psych: Normal mood, affect, hygiene and grooming.  ASSESSMENT/PLAN: Encounter Diagnoses  Name Primary?  . Essential hypertension Yes  . History of breast cancer   . Idiopathic hemochromatosis ([place])   . Atrial fibrillation, unspecified type ([place])   . History of CVA (cerebrovascular accident)   . Presbycusis of both ears   . Abnormality of gait   . Osteopenia, unspecified location   . Coronary artery disease involving native heart without angina pectoris, unspecified vessel or lesion type   . Macular degeneration, unspecified laterality, unspecified type    She is doing well on her present medication regimen. reviewed; healthy diet, including goals of calcium and vitamin D intake and reviewed; regular seatbelt use; changing batteries in smoke detectors.  Immunization recommendations discussed.  She was not interested in getting a flu shot.  Colonoscopy recommendations reviewed   Medicare Attestation I have personally reviewed: The patient's medical and social history Their use of alcohol, tobacco or illicit drugs Their current medications and supplements The patient's functional ability including ADLs,fall risks, home safety risks, cognitive, and hearing and visual impairment Diet and physical activities Evidence for depression or mood disorders  The patient's weight, height, and BMI have been recorded in the chart.  I have made referrals, counseling, and provided education to the patient based on review of the above and I have provided the patient with a written  personalized care plan for preventive services.     Jill Alexanders, MD   02/25/2019

## 2019-02-26 LAB — CBC WITH DIFFERENTIAL/PLATELET
Basophils Absolute: 0.1 10*3/uL (ref 0.0–0.2)
Basos: 1 %
EOS (ABSOLUTE): 0.1 10*3/uL (ref 0.0–0.4)
Eos: 1 %
Hematocrit: 34.8 % (ref 34.0–46.6)
Hemoglobin: 11.9 g/dL (ref 11.1–15.9)
Immature Grans (Abs): 0 10*3/uL (ref 0.0–0.1)
Immature Granulocytes: 0 %
Lymphocytes Absolute: 1.2 10*3/uL (ref 0.7–3.1)
Lymphs: 13 %
MCH: 33.1 pg — ABNORMAL HIGH (ref 26.6–33.0)
MCHC: 34.2 g/dL (ref 31.5–35.7)
MCV: 97 fL (ref 79–97)
Monocytes Absolute: 0.8 10*3/uL (ref 0.1–0.9)
Monocytes: 9 %
Neutrophils Absolute: 6.6 10*3/uL (ref 1.4–7.0)
Neutrophils: 76 %
Platelets: 293 10*3/uL (ref 150–450)
RBC: 3.59 x10E6/uL — ABNORMAL LOW (ref 3.77–5.28)
RDW: 11.9 % (ref 11.7–15.4)
WBC: 8.8 10*3/uL (ref 3.4–10.8)

## 2019-02-26 LAB — COMPREHENSIVE METABOLIC PANEL
ALT: 8 IU/L (ref 0–32)
AST: 15 IU/L (ref 0–40)
Albumin/Globulin Ratio: 1.9 (ref 1.2–2.2)
Albumin: 4.2 g/dL (ref 3.5–4.6)
Alkaline Phosphatase: 93 IU/L (ref 39–117)
BUN/Creatinine Ratio: 21 (ref 12–28)
BUN: 28 mg/dL (ref 10–36)
Bilirubin Total: 0.3 mg/dL (ref 0.0–1.2)
CO2: 23 mmol/L (ref 20–29)
Calcium: 9.1 mg/dL (ref 8.7–10.3)
Chloride: 102 mmol/L (ref 96–106)
Creatinine, Ser: 1.32 mg/dL — ABNORMAL HIGH (ref 0.57–1.00)
GFR calc Af Amer: 40 mL/min/{1.73_m2} — ABNORMAL LOW (ref >59)
GFR calc non Af Amer: 35 mL/min/{1.73_m2} — ABNORMAL LOW (ref >59)
Globulin, Total: 2.2 g/dL (ref 1.5–4.5)
Glucose: 90 mg/dL (ref 65–99)
Potassium: 5 mmol/L (ref 3.5–5.2)
Sodium: 138 mmol/L (ref 134–144)
Total Protein: 6.4 g/dL (ref 6.0–8.5)

## 2019-02-26 LAB — LIPID PANEL
Chol/HDL Ratio: 2 ratio (ref 0.0–4.4)
Cholesterol, Total: 135 mg/dL (ref 100–199)
HDL: 66 mg/dL (ref >39)
LDL Calculated: 54 mg/dL (ref 0–99)
Triglycerides: 75 mg/dL (ref 0–149)
VLDL Cholesterol Cal: 15 mg/dL (ref 5–40)

## 2019-03-08 ENCOUNTER — Other Ambulatory Visit: Payer: Self-pay | Admitting: Family Medicine

## 2019-03-08 DIAGNOSIS — Z8673 Personal history of transient ischemic attack (TIA), and cerebral infarction without residual deficits: Secondary | ICD-10-CM

## 2019-03-08 DIAGNOSIS — E785 Hyperlipidemia, unspecified: Secondary | ICD-10-CM

## 2019-04-02 ENCOUNTER — Other Ambulatory Visit: Payer: Self-pay | Admitting: Family Medicine

## 2019-04-02 DIAGNOSIS — I10 Essential (primary) hypertension: Secondary | ICD-10-CM

## 2019-04-08 ENCOUNTER — Other Ambulatory Visit: Payer: Self-pay | Admitting: Family Medicine

## 2019-04-17 ENCOUNTER — Encounter: Payer: Self-pay | Admitting: Gynecology

## 2019-08-05 ENCOUNTER — Other Ambulatory Visit: Payer: Self-pay

## 2019-08-07 ENCOUNTER — Encounter: Payer: Medicare Other | Admitting: Obstetrics and Gynecology

## 2019-08-10 ENCOUNTER — Ambulatory Visit: Payer: Medicare Other

## 2019-08-11 ENCOUNTER — Ambulatory Visit (INDEPENDENT_AMBULATORY_CARE_PROVIDER_SITE_OTHER): Payer: Medicare Other | Admitting: Family Medicine

## 2019-08-11 ENCOUNTER — Other Ambulatory Visit: Payer: Self-pay

## 2019-08-11 ENCOUNTER — Encounter: Payer: Self-pay | Admitting: Family Medicine

## 2019-08-11 VITALS — BP 144/68 | HR 57 | Temp 96.6°F | Wt 155.6 lb

## 2019-08-11 DIAGNOSIS — Z8673 Personal history of transient ischemic attack (TIA), and cerebral infarction without residual deficits: Secondary | ICD-10-CM | POA: Diagnosis not present

## 2019-08-11 DIAGNOSIS — I4891 Unspecified atrial fibrillation: Secondary | ICD-10-CM

## 2019-08-11 DIAGNOSIS — I10 Essential (primary) hypertension: Secondary | ICD-10-CM

## 2019-08-11 DIAGNOSIS — R609 Edema, unspecified: Secondary | ICD-10-CM

## 2019-08-11 NOTE — Patient Instructions (Signed)
Keep the feet elevated as much as possible and also use support stockings

## 2019-08-11 NOTE — Progress Notes (Signed)
   Subjective:    Patient ID: Tammy Lloyd, female    DOB: Sep 01, 1923, 86 y.o.   MRN: [id number]  HPI She is here with her daughter for evaluation of a 1 week history of leg swelling, right greater than left.  It does get worse in the evenings.  She does not complain of shortness of breath, diaphoresis, chest pain.  She is on Xarelto.  Apparently she does sit around a lot and does have mobility issues.  She does have a previous history of CVA.  Her daughter is her primary caregiver who in the near future will be having surgery and will need help.   Review of Systems     Objective:   Physical Exam Alert and in no distress.  Cardiac exam shows an irregular rhythm.  Lungs are clear to auscultation.  Lower extremity exam does show the       Assessment & Plan:  Peripheral edema - Plan: CBC with Differential/Platelet, Comprehensive metabolic panel  Essential hypertension - Plan: CBC with Differential/Platelet, Comprehensive metabolic panel  Atrial fibrillation, unspecified type ([place])  History of CVA (cerebrovascular accident) Recommend that she keep her feet elevated as much as possible as well as support stockings.  Explained that at this point it is probably related to dependent edema rather than other cardiac or vascular issue. We will also arrange for home health visit to assess any other needs.

## 2019-08-12 LAB — CBC WITH DIFFERENTIAL/PLATELET
Basophils Absolute: 0.1 10*3/uL (ref 0.0–0.2)
Basos: 1 %
EOS (ABSOLUTE): 0.1 10*3/uL (ref 0.0–0.4)
Eos: 2 %
Hematocrit: 29.5 % — ABNORMAL LOW (ref 34.0–46.6)
Hemoglobin: 10.2 g/dL — ABNORMAL LOW (ref 11.1–15.9)
Immature Grans (Abs): 0 10*3/uL (ref 0.0–0.1)
Immature Granulocytes: 0 %
Lymphocytes Absolute: 0.9 10*3/uL (ref 0.7–3.1)
Lymphs: 10 %
MCH: 33.1 pg — ABNORMAL HIGH (ref 26.6–33.0)
MCHC: 34.6 g/dL (ref 31.5–35.7)
MCV: 96 fL (ref 79–97)
Monocytes Absolute: 1 10*3/uL — ABNORMAL HIGH (ref 0.1–0.9)
Monocytes: 11 %
Neutrophils Absolute: 6.7 10*3/uL (ref 1.4–7.0)
Neutrophils: 76 %
Platelets: 434 10*3/uL (ref 150–450)
RBC: 3.08 x10E6/uL — ABNORMAL LOW (ref 3.77–5.28)
RDW: 12 % (ref 11.7–15.4)
WBC: 8.8 10*3/uL (ref 3.4–10.8)

## 2019-08-12 LAB — COMPREHENSIVE METABOLIC PANEL
ALT: 13 IU/L (ref 0–32)
AST: 21 IU/L (ref 0–40)
Albumin/Globulin Ratio: 1.6 (ref 1.2–2.2)
Albumin: 3.7 g/dL (ref 3.5–4.6)
Alkaline Phosphatase: 105 IU/L (ref 39–117)
BUN/Creatinine Ratio: 36 — ABNORMAL HIGH (ref 12–28)
BUN: 39 mg/dL — ABNORMAL HIGH (ref 10–36)
Bilirubin Total: 0.3 mg/dL (ref 0.0–1.2)
CO2: 19 mmol/L — ABNORMAL LOW (ref 20–29)
Calcium: 8.6 mg/dL — ABNORMAL LOW (ref 8.7–10.3)
Chloride: 102 mmol/L (ref 96–106)
Creatinine, Ser: 1.07 mg/dL — ABNORMAL HIGH (ref 0.57–1.00)
GFR calc Af Amer: 52 mL/min/{1.73_m2} — ABNORMAL LOW (ref >59)
GFR calc non Af Amer: 45 mL/min/{1.73_m2} — ABNORMAL LOW (ref >59)
Globulin, Total: 2.3 g/dL (ref 1.5–4.5)
Glucose: 96 mg/dL (ref 65–99)
Potassium: 5.1 mmol/L (ref 3.5–5.2)
Sodium: 136 mmol/L (ref 134–144)
Total Protein: 6 g/dL (ref 6.0–8.5)

## 2019-08-16 DIAGNOSIS — Z8673 Personal history of transient ischemic attack (TIA), and cerebral infarction without residual deficits: Secondary | ICD-10-CM | POA: Diagnosis not present

## 2019-08-16 DIAGNOSIS — R269 Unspecified abnormalities of gait and mobility: Secondary | ICD-10-CM | POA: Diagnosis not present

## 2019-08-16 DIAGNOSIS — I4891 Unspecified atrial fibrillation: Secondary | ICD-10-CM | POA: Diagnosis not present

## 2019-08-16 DIAGNOSIS — Z7901 Long term (current) use of anticoagulants: Secondary | ICD-10-CM | POA: Diagnosis not present

## 2019-08-16 DIAGNOSIS — R609 Edema, unspecified: Secondary | ICD-10-CM | POA: Diagnosis not present

## 2019-08-16 DIAGNOSIS — I10 Essential (primary) hypertension: Secondary | ICD-10-CM | POA: Diagnosis not present

## 2019-08-19 ENCOUNTER — Telehealth: Payer: Self-pay

## 2019-08-19 DIAGNOSIS — R269 Unspecified abnormalities of gait and mobility: Secondary | ICD-10-CM | POA: Diagnosis not present

## 2019-08-19 DIAGNOSIS — Z8673 Personal history of transient ischemic attack (TIA), and cerebral infarction without residual deficits: Secondary | ICD-10-CM | POA: Diagnosis not present

## 2019-08-19 DIAGNOSIS — Z7901 Long term (current) use of anticoagulants: Secondary | ICD-10-CM | POA: Diagnosis not present

## 2019-08-19 DIAGNOSIS — I4891 Unspecified atrial fibrillation: Secondary | ICD-10-CM | POA: Diagnosis not present

## 2019-08-19 DIAGNOSIS — R609 Edema, unspecified: Secondary | ICD-10-CM | POA: Diagnosis not present

## 2019-08-19 DIAGNOSIS — I10 Essential (primary) hypertension: Secondary | ICD-10-CM | POA: Diagnosis not present

## 2019-08-19 NOTE — Telephone Encounter (Signed)
Jeanne Ivan from [facility] is requesting verbal for PT in the home for 2x , 3 weeks and than 1x one week. Please advise so a call can be placed back to almira at [phone]. Thanks [employer]

## 2019-08-20 NOTE — Telephone Encounter (Signed)
Ok

## 2019-08-20 NOTE — Telephone Encounter (Signed)
[facility] was advised. [place]

## 2019-08-21 DIAGNOSIS — Z7901 Long term (current) use of anticoagulants: Secondary | ICD-10-CM | POA: Diagnosis not present

## 2019-08-21 DIAGNOSIS — R269 Unspecified abnormalities of gait and mobility: Secondary | ICD-10-CM | POA: Diagnosis not present

## 2019-08-21 DIAGNOSIS — Z8673 Personal history of transient ischemic attack (TIA), and cerebral infarction without residual deficits: Secondary | ICD-10-CM | POA: Diagnosis not present

## 2019-08-21 DIAGNOSIS — R609 Edema, unspecified: Secondary | ICD-10-CM | POA: Diagnosis not present

## 2019-08-21 DIAGNOSIS — I10 Essential (primary) hypertension: Secondary | ICD-10-CM | POA: Diagnosis not present

## 2019-08-21 DIAGNOSIS — I4891 Unspecified atrial fibrillation: Secondary | ICD-10-CM | POA: Diagnosis not present

## 2019-08-25 DIAGNOSIS — I10 Essential (primary) hypertension: Secondary | ICD-10-CM | POA: Diagnosis not present

## 2019-08-25 DIAGNOSIS — R269 Unspecified abnormalities of gait and mobility: Secondary | ICD-10-CM | POA: Diagnosis not present

## 2019-08-25 DIAGNOSIS — R609 Edema, unspecified: Secondary | ICD-10-CM | POA: Diagnosis not present

## 2019-08-25 DIAGNOSIS — Z7901 Long term (current) use of anticoagulants: Secondary | ICD-10-CM | POA: Diagnosis not present

## 2019-08-25 DIAGNOSIS — I4891 Unspecified atrial fibrillation: Secondary | ICD-10-CM | POA: Diagnosis not present

## 2019-08-25 DIAGNOSIS — Z8673 Personal history of transient ischemic attack (TIA), and cerebral infarction without residual deficits: Secondary | ICD-10-CM | POA: Diagnosis not present

## 2019-08-27 DIAGNOSIS — I10 Essential (primary) hypertension: Secondary | ICD-10-CM | POA: Diagnosis not present

## 2019-08-27 DIAGNOSIS — Z7901 Long term (current) use of anticoagulants: Secondary | ICD-10-CM | POA: Diagnosis not present

## 2019-08-27 DIAGNOSIS — I4891 Unspecified atrial fibrillation: Secondary | ICD-10-CM | POA: Diagnosis not present

## 2019-08-27 DIAGNOSIS — R269 Unspecified abnormalities of gait and mobility: Secondary | ICD-10-CM | POA: Diagnosis not present

## 2019-08-27 DIAGNOSIS — R609 Edema, unspecified: Secondary | ICD-10-CM | POA: Diagnosis not present

## 2019-08-27 DIAGNOSIS — Z8673 Personal history of transient ischemic attack (TIA), and cerebral infarction without residual deficits: Secondary | ICD-10-CM | POA: Diagnosis not present

## 2019-08-30 DIAGNOSIS — Z8673 Personal history of transient ischemic attack (TIA), and cerebral infarction without residual deficits: Secondary | ICD-10-CM | POA: Diagnosis not present

## 2019-08-30 DIAGNOSIS — Z7901 Long term (current) use of anticoagulants: Secondary | ICD-10-CM | POA: Diagnosis not present

## 2019-08-30 DIAGNOSIS — R609 Edema, unspecified: Secondary | ICD-10-CM | POA: Diagnosis not present

## 2019-08-30 DIAGNOSIS — I10 Essential (primary) hypertension: Secondary | ICD-10-CM | POA: Diagnosis not present

## 2019-08-30 DIAGNOSIS — R269 Unspecified abnormalities of gait and mobility: Secondary | ICD-10-CM | POA: Diagnosis not present

## 2019-08-30 DIAGNOSIS — I4891 Unspecified atrial fibrillation: Secondary | ICD-10-CM | POA: Diagnosis not present

## 2019-08-31 ENCOUNTER — Ambulatory Visit: Payer: Medicare Other

## 2019-09-02 DIAGNOSIS — Z8673 Personal history of transient ischemic attack (TIA), and cerebral infarction without residual deficits: Secondary | ICD-10-CM | POA: Diagnosis not present

## 2019-09-02 DIAGNOSIS — I10 Essential (primary) hypertension: Secondary | ICD-10-CM | POA: Diagnosis not present

## 2019-09-02 DIAGNOSIS — R269 Unspecified abnormalities of gait and mobility: Secondary | ICD-10-CM | POA: Diagnosis not present

## 2019-09-02 DIAGNOSIS — Z7901 Long term (current) use of anticoagulants: Secondary | ICD-10-CM | POA: Diagnosis not present

## 2019-09-02 DIAGNOSIS — R609 Edema, unspecified: Secondary | ICD-10-CM | POA: Diagnosis not present

## 2019-09-02 DIAGNOSIS — I4891 Unspecified atrial fibrillation: Secondary | ICD-10-CM | POA: Diagnosis not present

## 2019-09-04 DIAGNOSIS — R609 Edema, unspecified: Secondary | ICD-10-CM | POA: Diagnosis not present

## 2019-09-04 DIAGNOSIS — Z8673 Personal history of transient ischemic attack (TIA), and cerebral infarction without residual deficits: Secondary | ICD-10-CM | POA: Diagnosis not present

## 2019-09-04 DIAGNOSIS — I4891 Unspecified atrial fibrillation: Secondary | ICD-10-CM | POA: Diagnosis not present

## 2019-09-04 DIAGNOSIS — R269 Unspecified abnormalities of gait and mobility: Secondary | ICD-10-CM | POA: Diagnosis not present

## 2019-09-04 DIAGNOSIS — I10 Essential (primary) hypertension: Secondary | ICD-10-CM | POA: Diagnosis not present

## 2019-09-04 DIAGNOSIS — Z7901 Long term (current) use of anticoagulants: Secondary | ICD-10-CM | POA: Diagnosis not present

## 2019-09-08 ENCOUNTER — Telehealth: Payer: Self-pay | Admitting: Family Medicine

## 2019-09-08 DIAGNOSIS — R609 Edema, unspecified: Secondary | ICD-10-CM | POA: Diagnosis not present

## 2019-09-08 DIAGNOSIS — I10 Essential (primary) hypertension: Secondary | ICD-10-CM | POA: Diagnosis not present

## 2019-09-08 DIAGNOSIS — Z8673 Personal history of transient ischemic attack (TIA), and cerebral infarction without residual deficits: Secondary | ICD-10-CM | POA: Diagnosis not present

## 2019-09-08 DIAGNOSIS — R269 Unspecified abnormalities of gait and mobility: Secondary | ICD-10-CM | POA: Diagnosis not present

## 2019-09-08 DIAGNOSIS — I4891 Unspecified atrial fibrillation: Secondary | ICD-10-CM | POA: Diagnosis not present

## 2019-09-08 DIAGNOSIS — Z7901 Long term (current) use of anticoagulants: Secondary | ICD-10-CM | POA: Diagnosis not present

## 2019-09-08 NOTE — Telephone Encounter (Signed)
Ok

## 2019-09-08 NOTE — Telephone Encounter (Signed)
Rep called from [facility] home health. They are requesting PT for 2 times a week for 2 weeks and then 1 a week for 3 weeks. Please advise at [phone].

## 2019-09-09 NOTE — Telephone Encounter (Signed)
Lvm advising of ok. [place]

## 2019-09-11 DIAGNOSIS — R609 Edema, unspecified: Secondary | ICD-10-CM | POA: Diagnosis not present

## 2019-09-11 DIAGNOSIS — Z8673 Personal history of transient ischemic attack (TIA), and cerebral infarction without residual deficits: Secondary | ICD-10-CM | POA: Diagnosis not present

## 2019-09-11 DIAGNOSIS — Z7901 Long term (current) use of anticoagulants: Secondary | ICD-10-CM | POA: Diagnosis not present

## 2019-09-11 DIAGNOSIS — I10 Essential (primary) hypertension: Secondary | ICD-10-CM | POA: Diagnosis not present

## 2019-09-11 DIAGNOSIS — I4891 Unspecified atrial fibrillation: Secondary | ICD-10-CM | POA: Diagnosis not present

## 2019-09-11 DIAGNOSIS — R269 Unspecified abnormalities of gait and mobility: Secondary | ICD-10-CM | POA: Diagnosis not present

## 2019-09-15 DIAGNOSIS — Z7901 Long term (current) use of anticoagulants: Secondary | ICD-10-CM | POA: Diagnosis not present

## 2019-09-15 DIAGNOSIS — Z8673 Personal history of transient ischemic attack (TIA), and cerebral infarction without residual deficits: Secondary | ICD-10-CM | POA: Diagnosis not present

## 2019-09-15 DIAGNOSIS — I4891 Unspecified atrial fibrillation: Secondary | ICD-10-CM | POA: Diagnosis not present

## 2019-09-15 DIAGNOSIS — R609 Edema, unspecified: Secondary | ICD-10-CM | POA: Diagnosis not present

## 2019-09-15 DIAGNOSIS — R269 Unspecified abnormalities of gait and mobility: Secondary | ICD-10-CM | POA: Diagnosis not present

## 2019-09-15 DIAGNOSIS — I10 Essential (primary) hypertension: Secondary | ICD-10-CM | POA: Diagnosis not present

## 2019-09-16 DIAGNOSIS — Z8673 Personal history of transient ischemic attack (TIA), and cerebral infarction without residual deficits: Secondary | ICD-10-CM | POA: Diagnosis not present

## 2019-09-16 DIAGNOSIS — R609 Edema, unspecified: Secondary | ICD-10-CM | POA: Diagnosis not present

## 2019-09-16 DIAGNOSIS — I4891 Unspecified atrial fibrillation: Secondary | ICD-10-CM | POA: Diagnosis not present

## 2019-09-16 DIAGNOSIS — R269 Unspecified abnormalities of gait and mobility: Secondary | ICD-10-CM | POA: Diagnosis not present

## 2019-09-16 DIAGNOSIS — I10 Essential (primary) hypertension: Secondary | ICD-10-CM | POA: Diagnosis not present

## 2019-09-16 DIAGNOSIS — Z7901 Long term (current) use of anticoagulants: Secondary | ICD-10-CM | POA: Diagnosis not present

## 2019-09-18 DIAGNOSIS — Z7901 Long term (current) use of anticoagulants: Secondary | ICD-10-CM | POA: Diagnosis not present

## 2019-09-18 DIAGNOSIS — I10 Essential (primary) hypertension: Secondary | ICD-10-CM | POA: Diagnosis not present

## 2019-09-18 DIAGNOSIS — R609 Edema, unspecified: Secondary | ICD-10-CM | POA: Diagnosis not present

## 2019-09-18 DIAGNOSIS — R269 Unspecified abnormalities of gait and mobility: Secondary | ICD-10-CM | POA: Diagnosis not present

## 2019-09-18 DIAGNOSIS — I4891 Unspecified atrial fibrillation: Secondary | ICD-10-CM | POA: Diagnosis not present

## 2019-09-18 DIAGNOSIS — Z8673 Personal history of transient ischemic attack (TIA), and cerebral infarction without residual deficits: Secondary | ICD-10-CM | POA: Diagnosis not present

## 2019-09-22 ENCOUNTER — Telehealth: Payer: Self-pay | Admitting: Family Medicine

## 2019-09-22 NOTE — Telephone Encounter (Signed)
Received a call from daughter in response to a call she received. Pt received Pfizer Covid vaccine. She has received one dose and is scheduled to have to other on 10/07/2019.

## 2019-09-22 NOTE — Telephone Encounter (Signed)
Added to pt chart. [place]

## 2019-09-23 DIAGNOSIS — Z7901 Long term (current) use of anticoagulants: Secondary | ICD-10-CM | POA: Diagnosis not present

## 2019-09-23 DIAGNOSIS — I10 Essential (primary) hypertension: Secondary | ICD-10-CM | POA: Diagnosis not present

## 2019-09-23 DIAGNOSIS — Z8673 Personal history of transient ischemic attack (TIA), and cerebral infarction without residual deficits: Secondary | ICD-10-CM | POA: Diagnosis not present

## 2019-09-23 DIAGNOSIS — R269 Unspecified abnormalities of gait and mobility: Secondary | ICD-10-CM | POA: Diagnosis not present

## 2019-09-23 DIAGNOSIS — I4891 Unspecified atrial fibrillation: Secondary | ICD-10-CM | POA: Diagnosis not present

## 2019-09-23 DIAGNOSIS — R609 Edema, unspecified: Secondary | ICD-10-CM | POA: Diagnosis not present

## 2019-09-29 DIAGNOSIS — Z8673 Personal history of transient ischemic attack (TIA), and cerebral infarction without residual deficits: Secondary | ICD-10-CM | POA: Diagnosis not present

## 2019-09-29 DIAGNOSIS — Z7901 Long term (current) use of anticoagulants: Secondary | ICD-10-CM | POA: Diagnosis not present

## 2019-09-29 DIAGNOSIS — R609 Edema, unspecified: Secondary | ICD-10-CM | POA: Diagnosis not present

## 2019-09-29 DIAGNOSIS — I10 Essential (primary) hypertension: Secondary | ICD-10-CM | POA: Diagnosis not present

## 2019-09-29 DIAGNOSIS — I4891 Unspecified atrial fibrillation: Secondary | ICD-10-CM | POA: Diagnosis not present

## 2019-09-29 DIAGNOSIS — R269 Unspecified abnormalities of gait and mobility: Secondary | ICD-10-CM | POA: Diagnosis not present

## 2019-10-20 ENCOUNTER — Telehealth: Payer: Self-pay | Admitting: Family Medicine

## 2019-10-20 NOTE — Telephone Encounter (Signed)
Tammy Lloyd left message that she has had both of her Covid vaccines.

## 2019-11-18 DIAGNOSIS — Z961 Presence of intraocular lens: Secondary | ICD-10-CM | POA: Diagnosis not present

## 2019-11-18 DIAGNOSIS — H02834 Dermatochalasis of left upper eyelid: Secondary | ICD-10-CM | POA: Diagnosis not present

## 2019-11-18 DIAGNOSIS — H35372 Puckering of macula, left eye: Secondary | ICD-10-CM | POA: Diagnosis not present

## 2019-11-18 DIAGNOSIS — H353131 Nonexudative age-related macular degeneration, bilateral, early dry stage: Secondary | ICD-10-CM | POA: Diagnosis not present

## 2019-11-18 DIAGNOSIS — H02831 Dermatochalasis of right upper eyelid: Secondary | ICD-10-CM | POA: Diagnosis not present

## 2020-01-12 ENCOUNTER — Other Ambulatory Visit: Payer: Self-pay | Admitting: Family Medicine

## 2020-01-12 DIAGNOSIS — I10 Essential (primary) hypertension: Secondary | ICD-10-CM

## 2020-01-12 DIAGNOSIS — Z8673 Personal history of transient ischemic attack (TIA), and cerebral infarction without residual deficits: Secondary | ICD-10-CM

## 2020-01-12 DIAGNOSIS — E785 Hyperlipidemia, unspecified: Secondary | ICD-10-CM

## 2020-01-15 ENCOUNTER — Encounter (INDEPENDENT_AMBULATORY_CARE_PROVIDER_SITE_OTHER): Payer: Self-pay | Admitting: Ophthalmology

## 2020-01-15 ENCOUNTER — Ambulatory Visit (INDEPENDENT_AMBULATORY_CARE_PROVIDER_SITE_OTHER): Payer: Medicare Other | Admitting: Ophthalmology

## 2020-01-15 ENCOUNTER — Other Ambulatory Visit: Payer: Self-pay

## 2020-01-15 DIAGNOSIS — H35372 Puckering of macula, left eye: Secondary | ICD-10-CM | POA: Diagnosis not present

## 2020-01-15 DIAGNOSIS — H353123 Nonexudative age-related macular degeneration, left eye, advanced atrophic without subfoveal involvement: Secondary | ICD-10-CM | POA: Diagnosis not present

## 2020-01-15 DIAGNOSIS — H353132 Nonexudative age-related macular degeneration, bilateral, intermediate dry stage: Secondary | ICD-10-CM

## 2020-01-15 DIAGNOSIS — H353112 Nonexudative age-related macular degeneration, right eye, intermediate dry stage: Secondary | ICD-10-CM

## 2020-01-15 NOTE — Progress Notes (Signed)
01/15/2020     CHIEF COMPLAINT Patient presents for Blurred Vision   HISTORY OF PRESENT ILLNESS: Tammy Lloyd is a 86 y.o. female who presents to the clinic today for:   HPI    Blurred Vision    In both eyes.  Onset was sudden.  Vision is distorted.  Severity is moderate.  This started 2 weeks ago.  Occurring constantly.  It is worse throughout the day.  Context:  watching TV.  Since onset it is stable.  Treatments tried include no treatments.  Response to treatment was no improvement.  I, the attending physician,  performed the HPI with the patient and updated documentation appropriately.          Comments    Pt referred by Dr. Katy Fitch for Possible Dry AMD  PT states for a couple of weeks she has noticed that people on the TV are "squiggly". Pt denies FOL and floaters.       Last edited by Tilda Franco on 01/15/2020  9:41 AM. (History)      Referring physician: Denita Lung, [address],  [place] [address]  HISTORICAL INFORMATION:   Selected notes from the MEDICAL RECORD NUMBER    Lab Results  Component Value Date   HGBA1C 5.2 07/21/2016     CURRENT MEDICATIONS: No current outpatient medications on file. (Ophthalmic Drugs)   No current facility-administered medications for this visit. (Ophthalmic Drugs)   Current Outpatient Medications (Other)  Medication Sig  . acetaminophen (TYLENOL) 325 MG tablet Take 650 mg by mouth every 6 (six) hours as needed for headache (or pain). Reported on 10/14/2015  . amLODipine (NORVASC) 5 MG tablet TAKE 1 TABLET BY MOUTH  DAILY  . clobetasol cream (TEMOVATE) 0.05 % Apply small amount nightly as needed for irritation (Patient not taking: Reported on 08/11/2019)  . hydrALAZINE (APRESOLINE) 10 MG tablet TAKE 1 TABLET BY MOUTH  TWICE DAILY  . losartan-hydrochlorothiazide (HYZAAR) 100-25 MG tablet TAKE 1 TABLET BY MOUTH  DAILY (Patient not taking: Reported on 08/11/2019)  . pravastatin (PRAVACHOL) 20 MG tablet TAKE 1  TABLET BY MOUTH AT  BEDTIME  . Prednicarbate 0.1 % CREA Apply to affected area twice daily prn itching. (Patient not taking: Reported on 01/31/2018)  . valsartan-hydrochlorothiazide (DIOVAN-HCT) 160-12.5 MG tablet TAKE 1 TABLET BY MOUTH  DAILY  . XARELTO 15 MG TABS tablet TAKE 1 TABLET BY MOUTH  DAILY WITH SUPPER   No current facility-administered medications for this visit. (Other)      REVIEW OF SYSTEMS:    ALLERGIES Allergies  Allergen Reactions  . Tape Other (See Comments)    Skin is thin and may tear or bruise easily    PAST MEDICAL HISTORY Past Medical History:  Diagnosis Date  . Arthritis   . Cancer ([place])    Breast  . Cataract   . Colon polyp   . Edema of both legs   . Hemochromatosis    Followed by  hematology;  . Hemorrhoid   . Hypertension   . Idiopathic hemochromatosis 09/28/2011  . Lichen sclerosus   . Non-obstructive CAD (coronary artery disease)    a. 10/2011 Cath [facility]): LM nl, LAD 5/10ost, 10/15p, D1 nl, LCX 10/15ost, OM1/2 small, OM3 nl, RCA large/nl, RPDA/RPLV nl, EF 55-60%.  . Osteoporosis 2005   DEXA 2015 T score -1.7 stable from prior DEXA 2011 history of Fosamax 8 years. On drug-free holiday now  . Persistent atrial fibrillation ([facility])    a. CHA2DS2VASc =  5 -->Xarelto;  b. 07/2016 Echo: EF 55-60%, no rwma, mild MR, mild biatrial enlargement.  . Pulmonary hypertension ([place])   . Stroke [facility])    a. 07/2016 MRI: punctate acute ischemia in the R mid brain and possibly within the right pons w/o assoc hemorrhage or mass effect. Chronic microvascular ischemia.   Past Surgical History:  Procedure Laterality Date  . BACK SURGERY  2008  . BREAST LUMPECTOMY     Right  . CATARACT EXTRACTION, BILATERAL    . COLONOSCOPY  2009  . DILATION AND CURETTAGE OF UTERUS  2005  . HYSTEROSCOPY  08/2003  . LEFT HEART CATHETERIZATION WITH CORONARY ANGIOGRAM N/A 10/24/2011   Procedure: LEFT HEART CATHETERIZATION WITH CORONARY ANGIOGRAM;  Surgeon: Clent Demark, MD;   Location: [facility] CATH LAB;  Service: Cardiovascular;  Laterality: N/A;  . OTHER SURGICAL HISTORY Left 10/02/2017   biopsy of left eyelid  . TOTAL KNEE ARTHROPLASTY  2006    FAMILY HISTORY Family History  Problem Relation Age of Onset  . Cancer Mother 23       GYN cancer (had hysterectomy)  . Heart disease Mother        CHF  . Cancer Father        liver  . Asthma Sister   . Breast cancer Sister 74  . Heart disease Brother        rheumatic heart attack  . Breast cancer Sister 29  . Asthma Brother   . Cancer Brother        Prostate  . Cancer Brother        Prostate    SOCIAL HISTORY Social History   Tobacco Use  . Smoking status: Never Smoker  . Smokeless tobacco: Never Used  Vaping Use  . Vaping Use: Never used  Substance Use Topics  . Alcohol use: No    Alcohol/week: 0.0 standard drinks  . Drug use: No         OPHTHALMIC EXAM:  Base Eye Exam    Visual Acuity (Snellen - Linear)      Right Left   Dist [place] 20/50 20/60 +   Dist ph [place] 20/40 -2 NI       Tonometry (Tonopen, 9:47 AM)      Right Left   Pressure 11 12       Pupils      Pupils Dark Light Shape React APD   Right PERRL 2 2 Round Minimal None   Left PERRL 2 2 Round Minimal None       Visual Fields (Counting fingers)      Left Right    Full Full       Neuro/Psych    Oriented x3: Yes   Mood/Affect: Normal       Dilation    Both eyes: 1.0% Mydriacyl, 2.5% Phenylephrine @ 9:47 AM        Slit Lamp and Fundus Exam    External Exam      Right Left   External Normal Normal       Slit Lamp Exam      Right Left   Lids/Lashes Normal Normal   Conjunctiva/Sclera White and quiet White and quiet   Cornea Clear Clear   Anterior Chamber Deep and quiet Deep and quiet   Iris Round and reactive Round and reactive   Lens Centered posterior chamber intraocular lens Centered posterior chamber intraocular lens   Anterior Vitreous Normal Normal       Fundus Exam  Right Left   Posterior  Vitreous Normal Normal   Disc Normal Normal   C/D Ratio 0.45 0.45   Macula Soft drusen, no exudates, Pigmented atrophy, Advanced age related macular degeneration, no macular thickening, Retinal pigment epithelial mottling Soft drusen, no exudates, Pigmented atrophy, Advanced age related macular degeneration, no macular thickening, Retinal pigment epithelial mottling   Vessels Normal Normal   Periphery Normal Normal          IMAGING AND PROCEDURES  Imaging and Procedures for 01/15/20  OCT, Retina - OU - Both Eyes       Right Eye Quality was good. Central Foveal Thickness: 294. Progression has no prior data. Findings include abnormal foveal contour, retinal drusen , subretinal fluid, central retinal atrophy, outer retinal atrophy, intraretinal fluid.   Left Eye Quality was good. Central Foveal Thickness: 347. Progression has no prior data. Findings include abnormal foveal contour, subretinal fluid, central retinal atrophy, outer retinal atrophy, epiretinal membrane.   Notes OD, with small areas of what appear to be intraretinal fluid just inferior and inferotemporal to the foveal region.  OS with outer retinal foveal atrophy which accounts for the visual acuity, in addition to nondistorting retinal thickening from epiretinal membrane.                  ASSESSMENT/PLAN:  Left epiretinal membrane The nature of macular pucker (epiretinal membrane ERM) was discussed with the patient as well as threshold criteria for vitrectomy surgery. I explained that in rare cases another surgery is needed to actually remove a second wrinkle should it regrow.  Most often, the epiretinal membrane and underlying wrinkled internal limiting membrane are removed with the first surgery, to accomplish the goals.   If the operative eye is [place] (natural lens still present), cataract surgery is often recommended prior to Vitrectomy. This will enable the retina surgeon to have the best view during surgery  and the patient to obtain optimal results in the future. Treatment options were discussed. OS, minor and not the cause of central visual acuity loss will observe      ICD-10-CM   1. Left epiretinal membrane  H35.372 OCT, Retina - OU - Both Eyes  2. Intermediate stage nonexudative age-related macular degeneration of both eyes  H35.3132 OCT, Retina - OU - Both Eyes  3. Intermediate stage nonexudative age-related macular degeneration of right eye  H35.3112   4. Advanced nonexudative age-related macular degeneration of left eye without subfoveal involvement  H35.3123     1.  2.  3.  Ophthalmic Meds Ordered this visit:  No orders of the defined types were placed in this encounter.      Return in about 2 weeks (around 01/29/2020) for DILATE OU, OCT.  There are no Patient Instructions on file for this visit.   Explained the diagnoses, plan, and follow up with the patient and they expressed understanding.  Patient expressed understanding of the importance of proper follow up care.   Clent Demark Jerin Franzel M.D. Diseases & Surgery of the Retina and Vitreous [facility] [place] 01/15/20     Abbreviations: M myopia (nearsighted); A astigmatism; H hyperopia (farsighted); P presbyopia; Mrx spectacle prescription;  CTL contact lenses; OD right eye; OS left eye; OU both eyes  XT exotropia; ET esotropia; PEK punctate epithelial keratitis; PEE punctate epithelial erosions; DES dry eye syndrome; MGD meibomian gland dysfunction; ATs artificial tears; PFAT's preservative free artificial tears; [place] nuclear sclerotic cataract; PSC posterior subcapsular cataract; ERM epi-retinal membrane; PVD posterior vitreous detachment;  RD retinal detachment; DM diabetes mellitus; DR diabetic retinopathy; NPDR non-proliferative diabetic retinopathy; PDR proliferative diabetic retinopathy; CSME clinically significant macular edema; DME diabetic macular edema; dbh dot blot hemorrhages; CWS cotton wool spot; POAG  primary open angle glaucoma; C/D cup-to-disc ratio; HVF humphrey visual field; GVF goldmann visual field; OCT optical coherence tomography; IOP intraocular pressure; BRVO Branch retinal vein occlusion; CRVO central retinal vein occlusion; CRAO central retinal artery occlusion; BRAO branch retinal artery occlusion; RT retinal tear; SB scleral buckle; PPV pars plana vitrectomy; VH Vitreous hemorrhage; PRP panretinal laser photocoagulation; IVK intravitreal kenalog; VMT vitreomacular traction; MH Macular hole;  NVD neovascularization of the disc; NVE neovascularization elsewhere; AREDS age related eye disease study; ARMD age related macular degeneration; POAG primary open angle glaucoma; EBMD epithelial/anterior basement membrane dystrophy; ACIOL anterior chamber intraocular lens; IOL intraocular lens; PCIOL posterior chamber intraocular lens; Phaco/IOL phacoemulsification with intraocular lens placement; [place] photorefractive keratectomy; LASIK laser assisted in situ keratomileusis; HTN hypertension; DM diabetes mellitus; COPD chronic obstructive pulmonary disease

## 2020-01-15 NOTE — Assessment & Plan Note (Signed)
The nature of macular pucker (epiretinal membrane ERM) was discussed with the patient as well as threshold criteria for vitrectomy surgery. I explained that in rare cases another surgery is needed to actually remove a second wrinkle should it regrow.  Most often, the epiretinal membrane and underlying wrinkled internal limiting membrane are removed with the first surgery, to accomplish the goals.   If the operative eye is [place] (natural lens still present), cataract surgery is often recommended prior to Vitrectomy. This will enable the retina surgeon to have the best view during surgery and the patient to obtain optimal results in the future. Treatment options were discussed. OS, minor and not the cause of central visual acuity loss will observe

## 2020-01-29 ENCOUNTER — Encounter (INDEPENDENT_AMBULATORY_CARE_PROVIDER_SITE_OTHER): Payer: Self-pay | Admitting: Ophthalmology

## 2020-01-29 ENCOUNTER — Other Ambulatory Visit: Payer: Self-pay

## 2020-01-29 ENCOUNTER — Encounter: Payer: Self-pay | Admitting: Family Medicine

## 2020-01-29 ENCOUNTER — Ambulatory Visit (INDEPENDENT_AMBULATORY_CARE_PROVIDER_SITE_OTHER): Payer: Medicare Other | Admitting: Ophthalmology

## 2020-01-29 DIAGNOSIS — H35372 Puckering of macula, left eye: Secondary | ICD-10-CM | POA: Diagnosis not present

## 2020-01-29 DIAGNOSIS — H353123 Nonexudative age-related macular degeneration, left eye, advanced atrophic without subfoveal involvement: Secondary | ICD-10-CM | POA: Diagnosis not present

## 2020-01-29 NOTE — Assessment & Plan Note (Signed)
Outer foveal atrophy continues on the left eye with no signs of CNVM today, acuity has improved left eye.  Will observe

## 2020-01-29 NOTE — Progress Notes (Signed)
01/29/2020     CHIEF COMPLAINT Patient presents for Retina Follow Up   HISTORY OF PRESENT ILLNESS: Tammy Lloyd is a 86 y.o. female who presents to the clinic today for:   HPI    Retina Follow Up    Patient presents with  Other.  In both eyes.  Duration of 2 weeks.  Since onset it is stable.          Comments    2 week f.u - OCT OU Patient denies change in vision and overall has no complaints.        Last edited by Gerda Diss on 01/29/2020 10:32 AM. (History)      Referring physician: Denita Lung, [address],  [place] [id number]  HISTORICAL INFORMATION:   Selected notes from the MEDICAL RECORD NUMBER    Lab Results  Component Value Date   HGBA1C 5.2 07/21/2016     CURRENT MEDICATIONS: No current outpatient medications on file. (Ophthalmic Drugs)   No current facility-administered medications for this visit. (Ophthalmic Drugs)   Current Outpatient Medications (Other)  Medication Sig  . acetaminophen (TYLENOL) 325 MG tablet Take 650 mg by mouth every 6 (six) hours as needed for headache (or pain). Reported on 10/14/2015  . amLODipine (NORVASC) 5 MG tablet TAKE 1 TABLET BY MOUTH  DAILY  . clobetasol cream (TEMOVATE) 0.05 % Apply small amount nightly as needed for irritation (Patient not taking: Reported on 08/11/2019)  . hydrALAZINE (APRESOLINE) 10 MG tablet TAKE 1 TABLET BY MOUTH  TWICE DAILY  . losartan-hydrochlorothiazide (HYZAAR) 100-25 MG tablet TAKE 1 TABLET BY MOUTH  DAILY (Patient not taking: Reported on 08/11/2019)  . pravastatin (PRAVACHOL) 20 MG tablet TAKE 1 TABLET BY MOUTH AT  BEDTIME  . Prednicarbate 0.1 % CREA Apply to affected area twice daily prn itching. (Patient not taking: Reported on 01/31/2018)  . valsartan-hydrochlorothiazide (DIOVAN-HCT) 160-12.5 MG tablet TAKE 1 TABLET BY MOUTH  DAILY  . XARELTO 15 MG TABS tablet TAKE 1 TABLET BY MOUTH  DAILY WITH SUPPER   No current facility-administered medications for this visit.  (Other)      REVIEW OF SYSTEMS:    ALLERGIES Allergies  Allergen Reactions  . Tape Other (See Comments)    Skin is thin and may tear or bruise easily    PAST MEDICAL HISTORY Past Medical History:  Diagnosis Date  . Arthritis   . Cancer ([place])    Breast  . Cataract   . Colon polyp   . Edema of both legs   . Hemochromatosis    Followed by  hematology;  . Hemorrhoid   . Hypertension   . Idiopathic hemochromatosis 09/28/2011  . Lichen sclerosus   . Non-obstructive CAD (coronary artery disease)    a. 10/2011 [facility]): LM nl, LAD 5/10ost, 10/15p, D1 nl, LCX 10/15ost, OM1/2 small, OM3 nl, RCA large/nl, RPDA/RPLV nl, EF 55-60%.  . Osteoporosis 2005   DEXA 2015 T score -1.7 stable from prior DEXA 2011 history of Fosamax 8 years. On drug-free holiday now  . Persistent atrial fibrillation (HCC)    a. CHA2DS2VASc = 5 -->Xarelto;  b. 07/2016 Echo: EF 55-60%, no rwma, mild MR, mild biatrial enlargement.  . Pulmonary hypertension ([place])   . Stroke [facility])    a. 07/2016 MRI: punctate acute ischemia in the R mid brain and possibly within the right pons w/o assoc hemorrhage or mass effect. Chronic microvascular ischemia.   Past Surgical History:  Procedure Laterality Date  .  BACK SURGERY  2008  . BREAST LUMPECTOMY     Right  . CATARACT EXTRACTION, BILATERAL    . COLONOSCOPY  2009  . DILATION AND CURETTAGE OF UTERUS  2005  . HYSTEROSCOPY  08/2003  . LEFT HEART CATHETERIZATION WITH CORONARY ANGIOGRAM N/A 10/24/2011   Procedure: LEFT HEART CATHETERIZATION WITH CORONARY ANGIOGRAM;  Surgeon: Clent Demark, MD;  Location: [facility] CATH LAB;  Service: Cardiovascular;  Laterality: N/A;  . OTHER SURGICAL HISTORY Left 10/02/2017   biopsy of left eyelid  . TOTAL KNEE ARTHROPLASTY  2006    FAMILY HISTORY Family History  Problem Relation Age of Onset  . Cancer Mother 70       GYN cancer (had hysterectomy)  . Heart disease Mother        CHF  . Cancer Father        liver  . Asthma Sister     . Breast cancer Sister 68  . Heart disease Brother        rheumatic heart attack  . Breast cancer Sister 5  . Asthma Brother   . Cancer Brother        Prostate  . Cancer Brother        Prostate    SOCIAL HISTORY Social History   Tobacco Use  . Smoking status: Never Smoker  . Smokeless tobacco: Never Used  Vaping Use  . Vaping Use: Never used  Substance Use Topics  . Alcohol use: No    Alcohol/week: 0.0 standard drinks  . Drug use: No         OPHTHALMIC EXAM:  Base Eye Exam    Visual Acuity (Snellen - Linear)      Right Left   Dist cc 20/30-2 20/30-2   Correction: Glasses       Tonometry (Tonopen, 10:38 AM)      Right Left   Pressure 14 12       Pupils      Pupils Dark Light Shape React APD   Right PERRL 2 2 Round Minimal None   Left PERRL 2 2 Round Minimal None       Visual Fields (Counting fingers)      Left Right    Full Full       Extraocular Movement      Right Left    Full Full       Neuro/Psych    Oriented x3: Yes   Mood/Affect: Normal       Dilation    Both eyes: 1.0% Mydriacyl, 2.5% Phenylephrine @ 10:38 AM        Slit Lamp and Fundus Exam    External Exam      Right Left   External Normal Normal       Slit Lamp Exam      Right Left   Lids/Lashes Normal Normal   Conjunctiva/Sclera White and quiet White and quiet   Cornea Clear Clear   Anterior Chamber Deep and quiet Deep and quiet   Iris Round and reactive Round and reactive   Lens Posterior chamber intraocular lens Posterior chamber intraocular lens   Anterior Vitreous Normal Normal       Fundus Exam      Right Left   Posterior Vitreous Posterior vitreous detachment Posterior vitreous detachment   Disc Normal Normal   C/D Ratio 0.45 0.45   Macula Soft drusen, no exudates, Pigmented atrophy, Advanced age related macular degeneration, no macular thickening, Retinal pigment epithelial mottling Soft drusen, no exudates,  Pigmented atrophy, Advanced age related macular  degeneration, no macular thickening, Retinal pigment epithelial mottling   Vessels Normal Normal   Periphery Normal Normal          IMAGING AND PROCEDURES  Imaging and Procedures for 01/29/20  OCT, Retina - OU - Both Eyes       Right Eye Quality was good. Scan locations included subfoveal. Central Foveal Thickness: 279. Progression has been stable. Findings include no SRF, abnormal foveal contour, no IRF, retinal drusen .   Left Eye Quality was good. Scan locations included subfoveal. Central Foveal Thickness: 338. Progression has been stable. Findings include no SRF, retinal drusen , abnormal foveal contour, no IRF, outer retinal atrophy.   Notes Subfoveal RPE atrophy OS accounts for the atrophic change in the fovea and thinning of the foveal depression OS.  No signs of active CN VM and acuity has improved                ASSESSMENT/PLAN:  Advanced nonexudative age-related macular degeneration of left eye without subfoveal involvement Outer foveal atrophy continues on the left eye with no signs of CNVM today, acuity has improved left eye.  Will observe      ICD-10-CM   1. Left epiretinal membrane  H35.372 OCT, Retina - OU - Both Eyes  2. Advanced nonexudative age-related macular degeneration of left eye without subfoveal involvement  H35.3123 OCT, Retina - OU - Both Eyes    1.Subfoveal RPE atrophy OS accounts for the atrophic change in the fovea and thinning of the foveal depression OS.  No signs of active CN VM and acuity has improved  2.  3.  Ophthalmic Meds Ordered this visit:  No orders of the defined types were placed in this encounter.      No follow-ups on file.  There are no Patient Instructions on file for this visit.   Explained the diagnoses, plan, and follow up with the patient and they expressed understanding.  Patient expressed understanding of the importance of proper follow up care.   Clent Demark Mercy Malena M.D. Diseases & Surgery of the Retina  and Vitreous Retina & Diabetic [place] 01/29/20     Abbreviations: M myopia (nearsighted); A astigmatism; H hyperopia (farsighted); P presbyopia; Mrx spectacle prescription;  CTL contact lenses; OD right eye; OS left eye; OU both eyes  XT exotropia; ET esotropia; PEK punctate epithelial keratitis; PEE punctate epithelial erosions; DES dry eye syndrome; MGD meibomian gland dysfunction; ATs artificial tears; PFAT's preservative free artificial tears; [place] nuclear sclerotic cataract; PSC posterior subcapsular cataract; ERM epi-retinal membrane; PVD posterior vitreous detachment; RD retinal detachment; DM diabetes mellitus; DR diabetic retinopathy; NPDR non-proliferative diabetic retinopathy; PDR proliferative diabetic retinopathy; CSME clinically significant macular edema; DME diabetic macular edema; dbh dot blot hemorrhages; CWS cotton wool spot; POAG primary open angle glaucoma; C/D cup-to-disc ratio; HVF humphrey visual field; GVF goldmann visual field; OCT optical coherence tomography; IOP intraocular pressure; BRVO Branch retinal vein occlusion; CRVO central retinal vein occlusion; CRAO central retinal artery occlusion; BRAO branch retinal artery occlusion; RT retinal tear; SB scleral buckle; PPV pars plana vitrectomy; VH Vitreous hemorrhage; PRP panretinal laser photocoagulation; IVK intravitreal kenalog; VMT vitreomacular traction; MH Macular hole;  NVD neovascularization of the disc; NVE neovascularization elsewhere; AREDS age related eye disease study; ARMD age related macular degeneration; POAG primary open angle glaucoma; EBMD epithelial/anterior basement membrane dystrophy; ACIOL anterior chamber intraocular lens; IOL intraocular lens; PCIOL posterior chamber intraocular lens; Phaco/IOL phacoemulsification with intraocular lens placement; [place] photorefractive keratectomy;  LASIK laser assisted in situ keratomileusis; HTN hypertension; DM diabetes mellitus; COPD chronic obstructive pulmonary  disease

## 2020-04-01 ENCOUNTER — Encounter: Payer: Self-pay | Admitting: Family Medicine

## 2020-04-01 ENCOUNTER — Telehealth: Payer: Self-pay

## 2020-04-01 NOTE — Telephone Encounter (Signed)
Pt. Scheduled tomorrow with Audelia Acton at 11:15a.m.

## 2020-04-01 NOTE — Telephone Encounter (Signed)
Pt. Daughter Sharen Counter called and LM stating her mom has really bad swelling in both feet even with wearing compression everyday and also has been having nose bleeds everyday as well. She said it is very hard for her to get around right now wanted to know if you needed to see her or what?

## 2020-04-01 NOTE — Telephone Encounter (Signed)
She needs an appointment.

## 2020-04-02 ENCOUNTER — Ambulatory Visit
Admission: RE | Admit: 2020-04-02 | Discharge: 2020-04-02 | Disposition: A | Discharge status: Home / Self Care | Payer: Medicare Other | Source: Ambulatory Visit | Attending: Medical | Admitting: Medical

## 2020-04-02 ENCOUNTER — Encounter: Payer: Self-pay | Admitting: Medical

## 2020-04-02 ENCOUNTER — Ambulatory Visit (INDEPENDENT_AMBULATORY_CARE_PROVIDER_SITE_OTHER): Payer: Medicare Other | Admitting: Medical

## 2020-04-02 ENCOUNTER — Other Ambulatory Visit: Payer: Self-pay

## 2020-04-02 VITALS — BP 168/66 | HR 53

## 2020-04-02 DIAGNOSIS — M25661 Stiffness of right knee, not elsewhere classified: Secondary | ICD-10-CM

## 2020-04-02 DIAGNOSIS — R262 Difficulty in walking, not elsewhere classified: Secondary | ICD-10-CM | POA: Insufficient documentation

## 2020-04-02 DIAGNOSIS — R5383 Other fatigue: Secondary | ICD-10-CM | POA: Insufficient documentation

## 2020-04-02 DIAGNOSIS — W19XXXA Unspecified fall, initial encounter: Secondary | ICD-10-CM

## 2020-04-02 DIAGNOSIS — I4891 Unspecified atrial fibrillation: Secondary | ICD-10-CM | POA: Diagnosis not present

## 2020-04-02 DIAGNOSIS — I2510 Atherosclerotic heart disease of native coronary artery without angina pectoris: Secondary | ICD-10-CM | POA: Diagnosis not present

## 2020-04-02 DIAGNOSIS — R040 Epistaxis: Secondary | ICD-10-CM | POA: Diagnosis not present

## 2020-04-02 DIAGNOSIS — M25562 Pain in left knee: Secondary | ICD-10-CM

## 2020-04-02 DIAGNOSIS — I10 Essential (primary) hypertension: Secondary | ICD-10-CM | POA: Diagnosis not present

## 2020-04-02 DIAGNOSIS — M25462 Effusion, left knee: Secondary | ICD-10-CM | POA: Diagnosis not present

## 2020-04-02 DIAGNOSIS — G8929 Other chronic pain: Secondary | ICD-10-CM

## 2020-04-02 DIAGNOSIS — Z791 Long term (current) use of non-steroidal anti-inflammatories (NSAID): Secondary | ICD-10-CM

## 2020-04-02 DIAGNOSIS — Z789 Other specified health status: Secondary | ICD-10-CM

## 2020-04-02 DIAGNOSIS — R609 Edema, unspecified: Secondary | ICD-10-CM | POA: Diagnosis not present

## 2020-04-02 DIAGNOSIS — M25662 Stiffness of left knee, not elsewhere classified: Secondary | ICD-10-CM

## 2020-04-02 DIAGNOSIS — M1712 Unilateral primary osteoarthritis, left knee: Secondary | ICD-10-CM | POA: Diagnosis not present

## 2020-04-02 DIAGNOSIS — M25469 Effusion, unspecified knee: Secondary | ICD-10-CM | POA: Diagnosis not present

## 2020-04-02 DIAGNOSIS — M25561 Pain in right knee: Secondary | ICD-10-CM

## 2020-04-02 DIAGNOSIS — M7989 Other specified soft tissue disorders: Secondary | ICD-10-CM | POA: Diagnosis not present

## 2020-04-02 DIAGNOSIS — R269 Unspecified abnormalities of gait and mobility: Secondary | ICD-10-CM | POA: Diagnosis not present

## 2020-04-02 DIAGNOSIS — M810 Age-related osteoporosis without current pathological fracture: Secondary | ICD-10-CM | POA: Diagnosis not present

## 2020-04-02 MED ORDER — MUPIROCIN 2 % EX OINT: 1.0000 "application " | TOPICAL_OINTMENT | Freq: Three times a day (TID) | CUTANEOUS | 0 refills | Status: DC

## 2020-04-02 NOTE — Patient Instructions (Signed)
Please go to [facility] for your bilateral knee xray.   Their hours are 8am - 4:30 pm Monday - Friday.  Take your insurance card with you.  [facility] [phone]  [address], [place], [place] [address]  [address] [place], [place] [address]

## 2020-04-02 NOTE — Progress Notes (Signed)
Subjective: Chief Complaint  Patient presents with  . Foot Swelling    bilateral   . Epistaxis    almost everyday for 2-3 weeks   . Joint Swelling    left    Here for multiple c/o.   Here with daughter Jerilee Hoh.  Here for nosebleeds.  Having daily nose bleeds for 2-3 weeks.  Sometimes if blowing nose, just starts bleeding.   No recent illness.  No recent sneezing, sore throat, cough or URI.  Bleeds for about 2-3 minutes, sometimes 5-10 minutes to stop bleeding.  No other bruising or bleeding.  having leg swelling bilat, x at least a year.  No SOB, no CP, no decreased urine.  No problems or diagnoses prior with heart or kidney.  Has knee pains, can't fully extend knees.  She is limited in her walking at home, unstable, afraid of falls.   Has fallen a few times in the past month.   [place] getting out of bed for example.   Left knee feels like it wont support her weight.   Was seeing home health a year for these issues.  Past Medical History:  Diagnosis Date  . Arthritis   . Cancer ([place])    Breast  . Cataract   . Colon polyp   . Edema of both legs   . Hemochromatosis    Followed by  hematology;  . Hemorrhoid   . Hypertension   . Idiopathic hemochromatosis 09/28/2011  . Lichen sclerosus   . Non-obstructive CAD (coronary artery disease)    a. 10/2011 [facility]): LM nl, LAD 5/10ost, 10/15p, D1 nl, LCX 10/15ost, OM1/2 small, OM3 nl, RCA large/nl, RPDA/RPLV nl, EF 55-60%.  . Osteoporosis 2005   DEXA 2015 T score -1.7 stable from prior DEXA 2011 history of Fosamax 8 years. On drug-free holiday now  . Persistent atrial fibrillation (HCC)    a. CHA2DS2VASc = 5 -->Xarelto;  b. 07/2016 Echo: EF 55-60%, no rwma, mild MR, mild biatrial enlargement.  . Pulmonary hypertension ([place])   . Stroke [facility])    a. 07/2016 MRI: punctate acute ischemia in the R mid brain and possibly within the right pons w/o assoc hemorrhage or mass effect. Chronic microvascular ischemia.   Current Outpatient  Medications on File Prior to Visit  Medication Sig Dispense Refill  . amLODipine (NORVASC) 5 MG tablet TAKE 1 TABLET BY MOUTH  DAILY 90 tablet 3  . hydrALAZINE (APRESOLINE) 10 MG tablet TAKE 1 TABLET BY MOUTH  TWICE DAILY 180 tablet 3  . Multiple Vitamins-Minerals (PRESERVISION AREDS 2) CAPS Take 1 tablet by mouth 2 (two) times daily.    . pravastatin (PRAVACHOL) 20 MG tablet TAKE 1 TABLET BY MOUTH AT  BEDTIME 90 tablet 3  . valsartan-hydrochlorothiazide (DIOVAN-HCT) 160-12.5 MG tablet TAKE 1 TABLET BY MOUTH  DAILY 90 tablet 3  . XARELTO 15 MG TABS tablet TAKE 1 TABLET BY MOUTH  DAILY WITH SUPPER 90 tablet 3  . acetaminophen (TYLENOL) 325 MG tablet Take 650 mg by mouth every 6 (six) hours as needed for headache (or pain). Reported on 10/14/2015 (Patient not taking: Reported on 04/02/2020)    . clobetasol cream (TEMOVATE) 0.05 % Apply small amount nightly as needed for irritation (Patient not taking: Reported on 08/11/2019) 30 g 1  . Prednicarbate 0.1 % CREA Apply to affected area twice daily prn itching. (Patient not taking: Reported on 01/31/2018) 60 g 0   No current facility-administered medications on file prior to visit.   ROS as in  subjective   Objective: BP (!) 168/66   Pulse (!) 53   SpO2 97%    Wt Readings from Last 3 Encounters:  08/11/19 155 lb 9.6 oz (70.6 kg)  02/25/19 138 lb 6.4 oz (62.8 kg)  07/12/18 147 lb (66.7 kg)   Gen: wd, wn, nad white female, seated in wheelchair Nares with dried blood in upper inner nares bilat, otherwise dry appearing Bilat ankles swollen mild to moderate, lower legs with 1+ nonpitting edema Lungs clear Heart rrr, normal s1, s2, no murmurs Left knee swollen, ACE wrap in place, mild tendnerss. Pain with knee extension and somewhat guarded of left knee.   Right knee mild pain with ROM.  Unable to fully extend either knee.  Can get to about 110 degrees extension on on left, 120 on right.   Skin - no other obvious bruising or  bleeding    Assessment: Encounter Diagnoses  Name Primary?  . Chronic pain of both knees Yes  . Knee swelling   . Decreased range of motion of both knees   . Fall, initial encounter   . Edema, unspecified type   . Decreased activities of daily living (ADL)   . Impaired ambulation   . Frequent nosebleeds   . Atrial fibrillation, unspecified type ([place])   . Coronary artery disease involving native heart without angina pectoris, unspecified vessel or lesion type   . Essential hypertension   . Abnormality of gait   . Encounter for long-term (current) use of non-steroidal anti-inflammatories   . Fatigue, unspecified type       Plan: Chronic knee pain, swelling and decreased ROM - s/p prior TKR right.  Will send for xrays.    Falls - work to avoid falls.  Further eval of knee issue today  Edema - labs as below  Limited activity around the house, recent falls, prior physical therapy/home health eval last year.   C/t fall prevention measures  Nosebleeds - question if dry mucous membranes vs adverse effect of Xaretlo.  No other bleeding or bruising than nose.  Pending labs, consider intervention or therapy.  For now can use mupirocin ointment in each nare gently BID   C/t current medications for now    Tammy Lloyd was seen today for foot swelling, epistaxis and joint swelling.  Diagnoses and all orders for this visit:  Chronic pain of both knees -     DG Knee Complete 4 Views Left; Future -     DG Knee Complete 4 Views Right; Future  Knee swelling -     DG Knee Complete 4 Views Left; Future -     DG Knee Complete 4 Views Right; Future  Decreased range of motion of both knees -     DG Knee Complete 4 Views Left; Future -     DG Knee Complete 4 Views Right; Future  Fall, initial encounter -     DG Knee Complete 4 Views Left; Future -     DG Knee Complete 4 Views Right; Future  Edema, unspecified type -     CBC -     Basic metabolic panel -     Brain natriuretic  peptide  Decreased activities of daily living (ADL)  Impaired ambulation  Frequent nosebleeds -     CBC  Atrial fibrillation, unspecified type (HCC)  Coronary artery disease involving native heart without angina pectoris, unspecified vessel or lesion type  Essential hypertension -     CBC -     Basic  metabolic panel -     Brain natriuretic peptide  Abnormality of gait  Encounter for long-term (current) use of non-steroidal anti-inflammatories  Fatigue, unspecified type  Other orders -     mupirocin ointment (BACTROBAN) 2 %; Place 1 application into the nose 3 (three) times daily.   F/u pending labs, xrays

## 2020-04-03 LAB — BASIC METABOLIC PANEL
BUN/Creatinine Ratio: 22 (ref 12–28)
BUN: 25 mg/dL (ref 10–36)
CO2: 23 mmol/L (ref 20–29)
Calcium: 8.9 mg/dL (ref 8.7–10.3)
Chloride: 100 mmol/L (ref 96–106)
Creatinine, Ser: 1.16 mg/dL — ABNORMAL HIGH (ref 0.57–1.00)
GFR calc Af Amer: 47 mL/min/{1.73_m2} — ABNORMAL LOW (ref >59)
GFR calc non Af Amer: 40 mL/min/{1.73_m2} — ABNORMAL LOW (ref >59)
Glucose: 92 mg/dL (ref 65–99)
Potassium: 4.9 mmol/L (ref 3.5–5.2)
Sodium: 135 mmol/L (ref 134–144)

## 2020-04-03 LAB — CBC
Hematocrit: 33.1 % — ABNORMAL LOW (ref 34.0–46.6)
Hemoglobin: 11.3 g/dL (ref 11.1–15.9)
MCH: 33.4 pg — ABNORMAL HIGH (ref 26.6–33.0)
MCHC: 34.1 g/dL (ref 31.5–35.7)
MCV: 98 fL — ABNORMAL HIGH (ref 79–97)
Platelets: 333 10*3/uL (ref 150–450)
RBC: 3.38 x10E6/uL — ABNORMAL LOW (ref 3.77–5.28)
RDW: 12.2 % (ref 11.7–15.4)
WBC: 7.7 10*3/uL (ref 3.4–10.8)

## 2020-04-03 LAB — BRAIN NATRIURETIC PEPTIDE: BNP: 711.4 pg/mL — ABNORMAL HIGH (ref 0.0–100.0)

## 2020-04-04 ENCOUNTER — Other Ambulatory Visit: Payer: Self-pay | Admitting: Medical

## 2020-04-04 MED ORDER — FUROSEMIDE 20 MG PO TABS: 20.0000 mg | ORAL_TABLET | Freq: Every day | ORAL | 0 refills | Status: DC

## 2020-04-07 ENCOUNTER — Other Ambulatory Visit: Payer: Self-pay

## 2020-04-07 DIAGNOSIS — R609 Edema, unspecified: Secondary | ICD-10-CM

## 2020-04-09 ENCOUNTER — Ambulatory Visit (INDEPENDENT_AMBULATORY_CARE_PROVIDER_SITE_OTHER): Payer: Medicare Other | Admitting: Cardiology

## 2020-04-09 ENCOUNTER — Encounter: Payer: Self-pay | Admitting: Cardiology

## 2020-04-09 ENCOUNTER — Other Ambulatory Visit: Payer: Self-pay

## 2020-04-09 VITALS — BP 144/70 | HR 51 | Ht 64.0 in | Wt 167.8 lb

## 2020-04-09 DIAGNOSIS — I340 Nonrheumatic mitral (valve) insufficiency: Secondary | ICD-10-CM | POA: Diagnosis not present

## 2020-04-09 DIAGNOSIS — I2510 Atherosclerotic heart disease of native coronary artery without angina pectoris: Secondary | ICD-10-CM

## 2020-04-09 DIAGNOSIS — R609 Edema, unspecified: Secondary | ICD-10-CM

## 2020-04-09 NOTE — Progress Notes (Signed)
Cardiology Office Note:    Date:  04/09/2020   ID:  Tammy Lloyd, DOB Nov 12, 1923, MRN [id number]  PCP:  Denita Lung, MD  [facility] Cardiologist:  Candee Furbish, MD  [facility] Electrophysiologist:  None   Referring MD: Denita Lung, MD     History of Present Illness:    Tammy Lloyd is a 86 y.o. female here for the evaluation of lower extremity edema.  She has been noticing this edema may be since January or February 2021.  Seems to have been progressive.  May be slightly worse currently.  Wearing TED hose without success.  Has had minimal shortness of breath.  She even notices that when she wakes up her legs are edematous as well.  Does not have family history  Never used tobacco.     Past Medical History:  Diagnosis Date  . Arthritis   . Cancer ([place])    Breast  . Cataract   . Colon polyp   . Edema of both legs   . Hemochromatosis    Followed by  hematology;  . Hemorrhoid   . Hypertension   . Idiopathic hemochromatosis 09/28/2011  . Lichen sclerosus   . Non-obstructive CAD (coronary artery disease)    a. 10/2011 Cath [facility]): LM nl, LAD 5/10ost, 10/15p, D1 nl, LCX 10/15ost, OM1/2 small, OM3 nl, RCA large/nl, RPDA/RPLV nl, EF 55-60%.  . Osteoporosis 2005   DEXA 2015 T score -1.7 stable from prior DEXA 2011 history of Fosamax 8 years. On drug-free holiday now  . Persistent atrial fibrillation (HCC)    a. CHA2DS2VASc = 5 -->Xarelto;  b. 07/2016 Echo: EF 55-60%, no rwma, mild MR, mild biatrial enlargement.  . Pulmonary hypertension ([place])   . Stroke [facility])    a. 07/2016 MRI: punctate acute ischemia in the R mid brain and possibly within the right pons w/o assoc hemorrhage or mass effect. Chronic microvascular ischemia.    Past Surgical History:  Procedure Laterality Date  . BACK SURGERY  2008  . BREAST LUMPECTOMY     Right  . CATARACT EXTRACTION, BILATERAL    . COLONOSCOPY  2009  . DILATION AND CURETTAGE OF UTERUS  2005  . HYSTEROSCOPY  08/2003  .  LEFT HEART CATHETERIZATION WITH CORONARY ANGIOGRAM N/A 10/24/2011   Procedure: LEFT HEART CATHETERIZATION WITH CORONARY ANGIOGRAM;  Surgeon: Clent Demark, MD;  Location: [place] CATH LAB;  Service: Cardiovascular;  Laterality: N/A;  . OTHER SURGICAL HISTORY Left 10/02/2017   biopsy of left eyelid  . TOTAL KNEE ARTHROPLASTY  2006    Current Medications: Current Meds  Medication Sig  . acetaminophen (TYLENOL) 325 MG tablet Take 650 mg by mouth every 6 (six) hours as needed for headache (or pain). Reported on 10/14/2015  . clobetasol cream (TEMOVATE) 0.05 % Apply small amount nightly as needed for irritation  . furosemide (LASIX) 20 MG tablet Take 1 tablet (20 mg total) by mouth daily.  . hydrALAZINE (APRESOLINE) 10 MG tablet TAKE 1 TABLET BY MOUTH  TWICE DAILY  . Multiple Vitamins-Minerals (PRESERVISION AREDS 2) CAPS Take 1 tablet by mouth 2 (two) times daily.  . mupirocin ointment (BACTROBAN) 2 % Place 1 application into the nose 3 (three) times daily.  . pravastatin (PRAVACHOL) 20 MG tablet TAKE 1 TABLET BY MOUTH AT  BEDTIME  . Prednicarbate 0.1 % CREA Apply to affected area twice daily prn itching.  . valsartan-hydrochlorothiazide (DIOVAN-HCT) 160-12.5 MG tablet TAKE 1 TABLET BY MOUTH  DAILY  . XARELTO 15  MG TABS tablet TAKE 1 TABLET BY MOUTH  DAILY WITH SUPPER  . [DISCONTINUED] amLODipine (NORVASC) 5 MG tablet TAKE 1 TABLET BY MOUTH  DAILY     Allergies:   Tape   Social History   Socioeconomic History  . Marital status: Widowed    Spouse name: Not on file  . Number of children: Not on file  . Years of education: Not on file  . Highest education level: Not on file  Occupational History  . Not on file  Tobacco Use  . Smoking status: Never Smoker  . Smokeless tobacco: Never Used  Vaping Use  . Vaping Use: Never used  Substance and Sexual Activity  . Alcohol use: No    Alcohol/week: 0.0 standard drinks  . Drug use: No  . Sexual activity: Never    Birth control/protection:  Post-menopausal    Comment: 1st intercourse 86 yo-Fewer than 5 partners  Other Topics Concern  . Not on file  Social History Narrative   09/12/17 Lives alone. No pets   Social Determinants of Health   Financial Resource Strain:   . Difficulty of Paying Living Expenses: Not on file  Food Insecurity:   . Worried About Charity fundraiser in the Last Year: Not on file  . Ran Out of Food in the Last Year: Not on file  Transportation Needs:   . Lack of Transportation (Medical): Not on file  . Lack of Transportation (Non-Medical): Not on file  Physical Activity:   . Days of Exercise per Week: Not on file  . Minutes of Exercise per Session: Not on file  Stress:   . Feeling of Stress : Not on file  Social Connections:   . Frequency of Communication with Friends and Family: Not on file  . Frequency of Social Gatherings with Friends and Family: Not on file  . Attends Religious Services: Not on file  . Active Member of Clubs or Organizations: Not on file  . Attends Archivist Meetings: Not on file  . Marital Status: Not on file     Family History: The patient's family history includes Asthma in her brother and sister; Breast cancer (age of onset: 57) in her sister; Breast cancer (age of onset: 28) in her sister; Cancer in her brother, brother, and father; Cancer (age of onset: 61) in her mother; Heart disease in her brother and mother.  ROS:   Please see the history of present illness.     All other systems reviewed and are negative.  EKGs/Labs/Other Studies Reviewed:    The following studies were reviewed today:  ECHO 2018:  - Left ventricle: The cavity size was normal. Wall thickness was  normal. Systolic function was normal. The estimated ejection  fraction was in the range of 55% to 60%. Wall motion was normal;  there were no regional wall motion abnormalities.  - Mitral valve: There was mild regurgitation.  - Left atrium: The atrium was mildly dilated.  -  Right atrium: The atrium was mildly dilated.  - Atrial septum: No defect or patent foramen ovale was identified.   EKG:  EKG is  ordered today.  The ekg ordered today demonstrates atrial fibrillation heart rate 51 bpm right bundle branch block nonspecific ST-T wave changes with T wave inversion in the precordial leads as well as inferior leads.  Recent Labs: 08/11/2019: ALT 13 04/02/2020: BNP 711.4; BUN 25; Creatinine, Ser 1.16; Hemoglobin 11.3; Platelets 333; Potassium 4.9; Sodium 135  Recent Lipid Panel  Component Value Date/Time   CHOL 135 02/25/2019 1029   TRIG 75 02/25/2019 1029   HDL 66 02/25/2019 1029   CHOLHDL 2.0 02/25/2019 1029   CHOLHDL 2.4 12/28/2016 1321   VLDL 20 12/28/2016 1321   LDLCALC 54 02/25/2019 1029    Physical Exam:    VS:  BP (!) 144/70   Pulse (!) 51   Ht 5\' 4"  (1.626 m)   Wt 167 lb 12.8 oz (76.1 kg)   SpO2 95%   BMI 28.80 kg/m     Wt Readings from Last 3 Encounters:  04/09/20 167 lb 12.8 oz (76.1 kg)  08/11/19 155 lb 9.6 oz (70.6 kg)  02/25/19 138 lb 6.4 oz (62.8 kg)     GEN:  Well nourished, well developed in no acute distress HEENT: Normal NECK: No JVD; No carotid bruits LYMPHATICS: No lymphadenopathy CARDIAC: RRR, no murmurs, rubs, gallops RESPIRATORY:  Clear to auscultation without rales, wheezing or rhonchi  ABDOMEN: Soft, non-tender, non-distended MUSCULOSKELETAL:  2+ edema, ted HOSE; No deformity  SKIN: Warm and dry NEUROLOGIC:  Alert and oriented x 3 PSYCHIATRIC:  Normal affect   ASSESSMENT:    1. Edema, unspecified type   2. Nonrheumatic mitral valve regurgitation    PLAN:    In order of problems listed above:  Lower extremity edema -Fairly significant been worsening over the past several months. -She has been on amlodipine for several years however I would like for her to go ahead and stop this as this contributes to lower extremity edema. -I will also give her 40 mg p.o. twice daily for next 4 days and then go back to 40  mg once a day thereafter. -Check echocardiogram.  Previously had mitral regurgitation mild.  Permanent atrial fibrillation -Currently rate controlled 51 right bundle branch block noted.  No changes.  Continue with Xarelto.  Anticoagulation.  Mild mitral regurgitation -Repeating echocardiogram.  Essential hypertension -Mildly elevated today.  We are stopping the amlodipine because of the edema.  I am increasing her diuretics.  Hopefully her blood pressure will follow.  We will keep a close eye on this.   Medication Adjustments/Labs and Tests Ordered: Current medicines are reviewed at length with the patient today.  Concerns regarding medicines are outlined above.  Orders Placed This Encounter  Procedures  . ECHOCARDIOGRAM COMPLETE   No orders of the defined types were placed in this encounter.   Patient Instructions  Medication Instructions:  Please take Furosemide 40 mg twice a day for 4 days then return to once a day. Please discontinue your Amlodipine. Continue all other medications as listed.  *If you need a refill on your cardiac medications before your next appointment, please call your pharmacy*   Lab Work: None today. If you have labs (blood work) drawn today and your tests are completely normal, you will receive your results only by: Marland Kitchen MyChart Message (if you have MyChart) OR . A paper copy in the mail If you have any lab test that is abnormal or we need to change your treatment, we will call you to review the results.   Testing/Procedures: Your physician has requested that you have an echocardiogram. Echocardiography is a painless test that uses sound waves to create images of your heart. It provides your doctor with information about the size and shape of your heart and how well your heart's chambers and valves are working. This procedure takes approximately one hour. There are no restrictions for this procedure.  Follow-Up: At [facility], you and  your health  needs are our priority.  As part of our continuing mission to provide you with exceptional heart care, we have created designated Provider Care Teams.  These Care Teams include your primary Cardiologist (physician) and Advanced Practice Providers (APPs -  Physician Assistants and Nurse Practitioners) who all work together to provide you with the care you need, when you need it.  We recommend signing up for the patient portal called "MyChart".  Sign up information is provided on this After Visit Summary.  MyChart is used to connect with patients for Virtual Visits (Telemedicine).  Patients are able to view lab/test results, encounter notes, upcoming appointments, etc.  Non-urgent messages can be sent to your provider as well.   To learn more about what you can do with MyChart, go to [email or web address].    Your next appointment:   2 week(s)  The format for your next appointment:   In Person  Provider:   Candee Furbish, MD  Kathyrn Drown or Cecilie Kicks, NP    Thank you for choosing [facility]!!        Signed, Candee Furbish, MD  04/09/2020 5:07 PM    [place]

## 2020-04-09 NOTE — Patient Instructions (Addendum)
Medication Instructions:  Please take Furosemide 40 mg twice a day for 4 days then return to once a day. Please discontinue your Amlodipine. Continue all other medications as listed.  *If you need a refill on your cardiac medications before your next appointment, please call your pharmacy*   Lab Work: None today. If you have labs (blood work) drawn today and your tests are completely normal, you will receive your results only by: Marland Kitchen MyChart Message (if you have MyChart) OR . A paper copy in the mail If you have any lab test that is abnormal or we need to change your treatment, we will call you to review the results.   Testing/Procedures: Your physician has requested that you have an echocardiogram. Echocardiography is a painless test that uses sound waves to create images of your heart. It provides your doctor with information about the size and shape of your heart and how well your heart's chambers and valves are working. This procedure takes approximately one hour. There are no restrictions for this procedure.  Follow-Up: At [facility], you and your health needs are our priority.  As part of our continuing mission to provide you with exceptional heart care, we have created designated Provider Care Teams.  These Care Teams include your primary Cardiologist (physician) and Advanced Practice Providers (APPs -  Physician Assistants and Nurse Practitioners) who all work together to provide you with the care you need, when you need it.  We recommend signing up for the patient portal called "MyChart".  Sign up information is provided on this After Visit Summary.  MyChart is used to connect with patients for Virtual Visits (Telemedicine).  Patients are able to view lab/test results, encounter notes, upcoming appointments, etc.  Non-urgent messages can be sent to your provider as well.   To learn more about what you can do with MyChart, go to [email or web address].    Your next appointment:     2 week(s)  The format for your next appointment:   In Person  Provider:   Candee Furbish, MD  Kathyrn Drown or Cecilie Kicks, NP    Thank you for choosing [facility]!!

## 2020-04-10 ENCOUNTER — Other Ambulatory Visit: Payer: Self-pay | Admitting: Family Medicine

## 2020-04-10 ENCOUNTER — Other Ambulatory Visit: Payer: Self-pay | Admitting: Medical

## 2020-04-12 NOTE — Addendum Note (Signed)
Addended by: Maren Beach, Rey Fors A on: 04/12/2020 05:18 PM   Modules accepted: Orders

## 2020-04-13 ENCOUNTER — Other Ambulatory Visit: Payer: Self-pay

## 2020-04-13 MED ORDER — FUROSEMIDE 40 MG PO TABS: 40.0000 mg | ORAL_TABLET | Freq: Every day | ORAL | 3 refills | Status: DC

## 2020-04-13 NOTE — Telephone Encounter (Signed)
Pt's daughter calling stating that at [place] with Dr. Marlou Porch on 04/09/20, that Dr. Marlou Porch increased pt's furosemide and that he said that he would send in a new Rx for the change. Daughter stated that Dr. Marlou Porch increased pt's medication furosemide 20 mg tablet, pt taking 4 tablets daily for 4 days and then decreasing to 2 tablets daily. This was not done and pt is running out of medication. Please address

## 2020-04-13 NOTE — Telephone Encounter (Signed)
Prescription for Furosemide 40mg  sent to pt's pharmacy.  Called daughter and left VM letting her know that medication has been sent.

## 2020-04-19 ENCOUNTER — Ambulatory Visit (HOSPITAL_COMMUNITY): Payer: Medicare Other | Attending: Cardiology

## 2020-04-19 ENCOUNTER — Other Ambulatory Visit: Payer: Self-pay

## 2020-04-19 DIAGNOSIS — R600 Localized edema: Secondary | ICD-10-CM | POA: Diagnosis not present

## 2020-04-19 DIAGNOSIS — R609 Edema, unspecified: Secondary | ICD-10-CM | POA: Diagnosis not present

## 2020-04-19 DIAGNOSIS — I340 Nonrheumatic mitral (valve) insufficiency: Secondary | ICD-10-CM | POA: Diagnosis not present

## 2020-04-19 LAB — ECHOCARDIOGRAM COMPLETE
Area-P 1/2: 4.49 cm2
S' Lateral: 3 cm

## 2020-04-19 MED ORDER — PERFLUTREN LIPID MICROSPHERE
1.0000 mL | INTRAVENOUS | Status: AC | PRN
  Administered 2020-04-19: 1 mL via INTRAVENOUS

## 2020-04-27 ENCOUNTER — Encounter: Payer: Self-pay | Admitting: Cardiology

## 2020-04-27 ENCOUNTER — Other Ambulatory Visit: Payer: Self-pay

## 2020-04-27 ENCOUNTER — Ambulatory Visit (INDEPENDENT_AMBULATORY_CARE_PROVIDER_SITE_OTHER): Payer: Medicare Other | Admitting: Cardiology

## 2020-04-27 VITALS — BP 130/50 | HR 58 | Ht 64.0 in | Wt 144.0 lb

## 2020-04-27 DIAGNOSIS — R609 Edema, unspecified: Secondary | ICD-10-CM

## 2020-04-27 DIAGNOSIS — I2510 Atherosclerotic heart disease of native coronary artery without angina pectoris: Secondary | ICD-10-CM | POA: Diagnosis not present

## 2020-04-27 DIAGNOSIS — Z79899 Other long term (current) drug therapy: Secondary | ICD-10-CM | POA: Diagnosis not present

## 2020-04-27 NOTE — Patient Instructions (Signed)
Medication Instructions:  The current medical regimen is effective;  continue present plan and medications.  *If you need a refill on your cardiac medications before your next appointment, please call your pharmacy*  Lab Work: Please have blood work today (BMP)  If you have labs (blood work) drawn today and your tests are completely normal, you will receive your results only by: Marland Kitchen MyChart Message (if you have MyChart) OR . A paper copy in the mail If you have any lab test that is abnormal or we need to change your treatment, we will call you to review the results.  Follow-Up: At [facility], you and your health needs are our priority.  As part of our continuing mission to provide you with exceptional heart care, we have created designated Provider Care Teams.  These Care Teams include your primary Cardiologist (physician) and Advanced Practice Providers (APPs -  Physician Assistants and Nurse Practitioners) who all work together to provide you with the care you need, when you need it.  We recommend signing up for the patient portal called "MyChart".  Sign up information is provided on this After Visit Summary.  MyChart is used to connect with patients for Virtual Visits (Telemedicine).  Patients are able to view lab/test results, encounter notes, upcoming appointments, etc.  Non-urgent messages can be sent to your provider as well.   To learn more about what you can do with MyChart, go to [email or web address].    Your next appointment:   3 month(s)  The format for your next appointment:   In Person  Provider:   Cecilie Kicks, NP  Thank you for choosing [facility]!!

## 2020-04-27 NOTE — Progress Notes (Signed)
Cardiology Office Note:    Date:  04/27/2020   ID:  Tammy Lloyd, DOB 1923/12/18, MRN [id number]  PCP:  Denita Lung, MD  [facility] Cardiologist:  Candee Furbish, MD  [facility] Electrophysiologist:  None   Referring MD: Denita Lung, MD     History of Present Illness:    Tammy Lloyd is a 86 y.o. female here for the follow-up of lower extremity edema seen last on 04/09/2020.  This edema has been noted since January or February 2021 and seem to be progressive.  She tried TED hose.  Even when she woke up she felt as though her legs were edematous.  Her echocardiogram on 04/19/2020 showed severely elevated pulmonary artery pressures with normal left ventricular ejection fraction.  No family history of early coronary disease or heart failure, no tobacco use.  Past Medical History:  Diagnosis Date   Arthritis    Cancer ([place])    Breast   Cataract    Colon polyp    Edema of both legs    Hemochromatosis    Followed by  hematology;   Hemorrhoid    Hypertension    Idiopathic hemochromatosis 8/92/1194   Lichen sclerosus    Non-obstructive CAD (coronary artery disease)    a. 10/2011 Cath [facility]): LM nl, LAD 5/10ost, 10/15p, D1 nl, LCX 10/15ost, OM1/2 small, OM3 nl, RCA large/nl, RPDA/RPLV nl, EF 55-60%.   Osteoporosis 2005   DEXA 2015 T score -1.7 stable from prior DEXA 2011 history of Fosamax 8 years. On drug-free holiday now   Persistent atrial fibrillation ([place])    a. CHA2DS2VASc = 5 -->Xarelto;  b. 07/2016 Echo: EF 55-60%, no rwma, mild MR, mild biatrial enlargement.   Pulmonary hypertension ([place])    Stroke ([place])    a. 07/2016 MRI: punctate acute ischemia in the R mid brain and possibly within the right pons w/o assoc hemorrhage or mass effect. Chronic microvascular ischemia.    Past Surgical History:  Procedure Laterality Date   BACK SURGERY  2008   BREAST LUMPECTOMY     Right   CATARACT EXTRACTION, BILATERAL     COLONOSCOPY  2009    DILATION AND CURETTAGE OF UTERUS  2005   HYSTEROSCOPY  08/2003   LEFT HEART CATHETERIZATION WITH CORONARY ANGIOGRAM N/A 10/24/2011   Procedure: LEFT HEART CATHETERIZATION WITH CORONARY ANGIOGRAM;  Surgeon: Clent Demark, MD;  Location: [place] CATH [facility];  Service: Cardiovascular;  Laterality: N/A;   OTHER SURGICAL HISTORY Left 10/02/2017   biopsy of left eyelid   TOTAL KNEE ARTHROPLASTY  2006    Current Medications: Current Meds  Medication Sig   acetaminophen (TYLENOL) 325 MG tablet Take 650 mg by mouth every 6 (six) hours as needed for headache (or pain). Reported on 10/14/2015   clobetasol cream (TEMOVATE) 0.05 % Apply small amount nightly as needed for irritation   furosemide (LASIX) 40 MG tablet Take 1 tablet (40 mg total) by mouth daily.   hydrALAZINE (APRESOLINE) 10 MG tablet TAKE 1 TABLET BY MOUTH  TWICE DAILY   Multiple Vitamins-Minerals (PRESERVISION AREDS 2) CAPS Take 1 tablet by mouth 2 (two) times daily.   mupirocin ointment (BACTROBAN) 2 % Place 1 application into the nose 3 (three) times daily.   pravastatin (PRAVACHOL) 20 MG tablet TAKE 1 TABLET BY MOUTH AT  BEDTIME   Prednicarbate 0.1 % CREA Apply to affected area twice daily prn itching.   valsartan-hydrochlorothiazide (DIOVAN-HCT) 160-12.5 MG tablet TAKE 1 TABLET BY MOUTH  DAILY  XARELTO 15 MG TABS tablet TAKE 1 TABLET BY MOUTH  DAILY WITH SUPPER     Allergies:   Tape   Social History   Socioeconomic History   Marital status: Widowed    Spouse name: Not on file   Number of children: Not on file   Years of education: Not on file   Highest education level: Not on file  Occupational History   Not on file  Tobacco Use   Smoking status: Never Smoker   Smokeless tobacco: Never Used  Vaping Use   Vaping Use: Never used  Substance and Sexual Activity   Alcohol use: No    Alcohol/week: 0.0 standard drinks   Drug use: No   Sexual activity: Never    Birth control/protection: Post-menopausal     Comment: 1st intercourse 86 yo-Fewer than 5 partners  Other Topics Concern   Not on file  Social History Narrative   09/12/17 Lives alone. No pets   Social Determinants of Health   Financial Resource Strain:    Difficulty of Paying Living Expenses: Not on file  Food Insecurity:    Worried About Charity fundraiser in the Last Year: Not on file   [employer] of Food in the Last Year: Not on file  Transportation Needs:    Lack of Transportation (Medical): Not on file   Lack of Transportation (Non-Medical): Not on file  Physical Activity:    Days of Exercise per Week: Not on file   Minutes of Exercise per Session: Not on file  Stress:    Feeling of Stress : Not on file  Social Connections:    Frequency of Communication with Friends and Family: Not on file   Frequency of Social Gatherings with Friends and Family: Not on file   Attends Religious Services: Not on file   Active Member of Clubs or Organizations: Not on file   Attends Archivist Meetings: Not on file   Marital Status: Not on file     Family History: The patient's family history includes Asthma in her brother and sister; Breast cancer (age of onset: 44) in her sister; Breast cancer (age of onset: 89) in her sister; Cancer in her brother, brother, and father; Cancer (age of onset: 44) in her mother; Heart disease in her brother and mother.  ROS:   Please see the history of present illness.     All other systems reviewed and are negative.  EKGs/Labs/Other Studies Reviewed:    The following studies were reviewed today: Echocardiogram 04/19/2020-EF 60% with some flattening of the septum, severely elevated pulmonary artery pressures of 91 mmHg were noted.  Recent Labs: 08/11/2019: ALT 13 04/02/2020: BNP 711.4; BUN 25; Creatinine, Ser 1.16; Hemoglobin 11.3; Platelets 333; Potassium 4.9; Sodium 135  Recent Lipid Panel    Component Value Date/Time   CHOL 135 02/25/2019 1029   TRIG 75 02/25/2019 1029    HDL 66 02/25/2019 1029   CHOLHDL 2.0 02/25/2019 1029   CHOLHDL 2.4 12/28/2016 1321   VLDL 20 12/28/2016 1321   LDLCALC 54 02/25/2019 1029     Risk Assessment/Calculations:       Physical Exam:    VS:  BP (!) 130/50    Pulse (!) 58    Ht 5\' 4"  (1.626 m)    Wt 144 lb (65.3 kg)    SpO2 96%    BMI 24.72 kg/m     Wt Readings from Last 3 Encounters:  04/27/20 144 lb (65.3 kg)  04/09/20  167 lb 12.8 oz (76.1 kg)  08/11/19 155 lb 9.6 oz (70.6 kg)     GEN:  Elderly well nourished, well developed in no acute distress HEENT: Normal NECK: No JVD; No carotid bruits LYMPHATICS: No lymphadenopathy CARDIAC: RRR, no murmurs, rubs, gallops RESPIRATORY:  Clear to auscultation without rales, wheezing or rhonchi  ABDOMEN: Soft, non-tender, non-distended MUSCULOSKELETAL:  Edema resolved; No deformity  SKIN: Warm and dry NEUROLOGIC:  Alert and oriented x 3 PSYCHIATRIC:  Normal affect   ASSESSMENT:    1. Edema, unspecified type   2. Medication management    PLAN:    In order of problems listed above:  Lower extremity edema in the setting of severe pulmonary hypertension -Great resolution of edema. She is down about 20 pounds. -She was given Lasix 40 mg twice a day to take for 4 days then back to 40 mg thereafter.  Certainly her elevated pulmonary pressures are playing a role in her current manifestation of edema. -We stopped amlodipine previously.  Permanent atrial fibrillation -She is currently on Xarelto 15 mg dose adjusted.  Rate controlled.  No changes made with medical management.  Essential hypertension -Under better control. Medications reviewed. Once we were able to decrease her fluid balance, her blood pressure followed as well. -We will go ahead and check a basic metabolic profile today to make sure that her renal function is safe. If necessary, we can reduce her Lasix to 20 mg a day maintenance. She will try to weigh herself.   Medication Adjustments/Labs and Tests  Ordered: Current medicines are reviewed at length with the patient today.  Concerns regarding medicines are outlined above.  Orders Placed This Encounter  Procedures   Basic metabolic panel   No orders of the defined types were placed in this encounter.   Patient Instructions  Medication Instructions:  The current medical regimen is effective;  continue present plan and medications.  *If you need a refill on your cardiac medications before your next appointment, please call your pharmacy*  Lab Work: Please have blood work today (BMP)  If you have labs (blood work) drawn today and your tests are completely normal, you will receive your results only by:  MyChart Message (if you have MyChart) OR  A paper copy in the mail If you have any lab test that is abnormal or we need to change your treatment, we will call you to review the results.  Follow-Up: At [facility], you and your health needs are our priority.  As part of our continuing mission to provide you with exceptional heart care, we have created designated Provider Care Teams.  These Care Teams include your primary Cardiologist (physician) and Advanced Practice Providers (APPs -  Physician Assistants and Nurse Practitioners) who all work together to provide you with the care you need, when you need it.  We recommend signing up for the patient portal called "MyChart".  Sign up information is provided on this After Visit Summary.  MyChart is used to connect with patients for Virtual Visits (Telemedicine).  Patients are able to view lab/test results, encounter notes, upcoming appointments, etc.  Non-urgent messages can be sent to your provider as well.   To learn more about what you can do with MyChart, go to [email or web address].    Your next appointment:   3 month(s)  The format for your next appointment:   In Person  Provider:   Cecilie Kicks, NP  Thank you for choosing [facility]!!  Signed, Candee Furbish, MD  04/27/2020 1:19 PM    [facility]

## 2020-04-28 LAB — BASIC METABOLIC PANEL
BUN/Creatinine Ratio: 27 (ref 12–28)
BUN: 33 mg/dL (ref 10–36)
CO2: 24 mmol/L (ref 20–29)
Calcium: 8.7 mg/dL (ref 8.7–10.3)
Chloride: 100 mmol/L (ref 96–106)
Creatinine, Ser: 1.24 mg/dL — ABNORMAL HIGH (ref 0.57–1.00)
GFR calc Af Amer: 43 mL/min/{1.73_m2} — ABNORMAL LOW (ref >59)
GFR calc non Af Amer: 37 mL/min/{1.73_m2} — ABNORMAL LOW (ref >59)
Glucose: 89 mg/dL (ref 65–99)
Potassium: 4.4 mmol/L (ref 3.5–5.2)
Sodium: 138 mmol/L (ref 134–144)

## 2020-05-04 ENCOUNTER — Other Ambulatory Visit: Payer: Self-pay | Admitting: Medical

## 2020-07-28 ENCOUNTER — Ambulatory Visit: Payer: Medicare Other | Admitting: Cardiology

## 2020-08-02 ENCOUNTER — Encounter (INDEPENDENT_AMBULATORY_CARE_PROVIDER_SITE_OTHER): Payer: Medicare Other | Admitting: Ophthalmology

## 2020-08-09 ENCOUNTER — Encounter (INDEPENDENT_AMBULATORY_CARE_PROVIDER_SITE_OTHER): Payer: Self-pay | Admitting: Ophthalmology

## 2020-08-09 ENCOUNTER — Ambulatory Visit (INDEPENDENT_AMBULATORY_CARE_PROVIDER_SITE_OTHER): Payer: Medicare Other | Admitting: Ophthalmology

## 2020-08-09 ENCOUNTER — Other Ambulatory Visit: Payer: Self-pay

## 2020-08-09 DIAGNOSIS — H353112 Nonexudative age-related macular degeneration, right eye, intermediate dry stage: Secondary | ICD-10-CM

## 2020-08-09 DIAGNOSIS — H35372 Puckering of macula, left eye: Secondary | ICD-10-CM | POA: Diagnosis not present

## 2020-08-09 DIAGNOSIS — H353123 Nonexudative age-related macular degeneration, left eye, advanced atrophic without subfoveal involvement: Secondary | ICD-10-CM

## 2020-08-09 NOTE — Patient Instructions (Signed)
Patient instructed to contact the office promptly for new onset visual acuity declines or distortions 

## 2020-08-09 NOTE — Assessment & Plan Note (Signed)
No signs of CNVM today

## 2020-08-09 NOTE — Assessment & Plan Note (Signed)
Minor with no progression, nasal portion of macula OS

## 2020-08-09 NOTE — Assessment & Plan Note (Signed)
Stable OU with no progression to CNVM

## 2020-08-09 NOTE — Progress Notes (Signed)
08/09/2020     CHIEF COMPLAINT Patient presents for Retina Follow Up (6 Month ERM f\u. OCT/Pt states she has not noticed any changes in vision. Denies new complaints.)   HISTORY OF PRESENT ILLNESS: Tammy Lloyd is a 87 y.o. female who presents to the clinic today for:   HPI    Retina Follow Up    Diagnosis: ERM.  In left eye.  Severity is moderate.  Duration of 6 months.  Since onset it is stable.  I, the attending physician,  performed the HPI with the patient and updated documentation appropriately. Additional comments: 6 Month ERM f\u. OCT Pt states she has not noticed any changes in vision. Denies new complaints.       Last edited by Tilda Franco on 08/09/2020  9:49 AM. (History)      Referring physician: Denita Lung, [place],   [id number]  HISTORICAL INFORMATION:   Selected notes from the MEDICAL RECORD NUMBER    Lab Results  Component Value Date   HGBA1C 5.2 07/21/2016     CURRENT MEDICATIONS: No current outpatient medications on file. (Ophthalmic Drugs)   No current facility-administered medications for this visit. (Ophthalmic Drugs)   Current Outpatient Medications (Other)  Medication Sig  . acetaminophen (TYLENOL) 325 MG tablet Take 650 mg by mouth every 6 (six) hours as needed for headache (or pain). Reported on 10/14/2015  . clobetasol cream (TEMOVATE) 0.05 % Apply small amount nightly as needed for irritation  . furosemide (LASIX) 40 MG tablet Take 1 tablet (40 mg total) by mouth daily.  . hydrALAZINE (APRESOLINE) 10 MG tablet TAKE 1 TABLET BY MOUTH  TWICE DAILY  . Multiple Vitamins-Minerals (PRESERVISION AREDS 2) CAPS Take 1 tablet by mouth 2 (two) times daily.  . mupirocin ointment (BACTROBAN) 2 % Place 1 application into the nose 3 (three) times daily.  . pravastatin (PRAVACHOL) 20 MG tablet TAKE 1 TABLET BY MOUTH AT  BEDTIME  . Prednicarbate 0.1 % CREA Apply to affected area twice daily prn itching.  .  valsartan-hydrochlorothiazide (DIOVAN-HCT) 160-12.5 MG tablet TAKE 1 TABLET BY MOUTH  DAILY  . XARELTO 15 MG TABS tablet TAKE 1 TABLET BY MOUTH  DAILY WITH SUPPER   No current facility-administered medications for this visit. (Other)      REVIEW OF SYSTEMS:    ALLERGIES Allergies  Allergen Reactions  . Tape Other (See Comments)    Skin is thin and may tear or bruise easily    PAST MEDICAL HISTORY Past Medical History:  Diagnosis Date  . Arthritis   . Cancer ([place])    Breast  . Cataract   . Colon polyp   . Edema of both legs   . Hemochromatosis    Followed by  hematology;  . Hemorrhoid   . Hypertension   . Idiopathic hemochromatosis 09/28/2011  . Lichen sclerosus   . Non-obstructive CAD (coronary artery disease)    a. 10/2011 Cath [facility]): LM nl, LAD 5/10ost, 10/15p, D1 nl, LCX 10/15ost, OM1/2 small, OM3 nl, RCA large/nl, RPDA/RPLV nl, EF 55-60%.  . Osteoporosis 2005   DEXA 2015 T score -1.7 stable from prior DEXA 2011 history of Fosamax 8 years. On drug-free holiday now  . Persistent atrial fibrillation ([facility])    a. CHA2DS2VASc = 5 -->Xarelto;  b. 07/2016 Echo: EF 55-60%, no rwma, mild MR, mild biatrial enlargement.  . Pulmonary hypertension ([place])   . Stroke [facility])    a. 07/2016 MRI: punctate acute ischemia in  the R mid brain and possibly within the right pons w/o assoc hemorrhage or mass effect. Chronic microvascular ischemia.   Past Surgical History:  Procedure Laterality Date  . BACK SURGERY  2008  . BREAST LUMPECTOMY     Right  . CATARACT EXTRACTION, BILATERAL    . COLONOSCOPY  2009  . DILATION AND CURETTAGE OF UTERUS  2005  . HYSTEROSCOPY  08/2003  . LEFT HEART CATHETERIZATION WITH CORONARY ANGIOGRAM N/A 10/24/2011   Procedure: LEFT HEART CATHETERIZATION WITH CORONARY ANGIOGRAM;  Surgeon: Clent Demark, MD;  Location: [place] CATH LAB;  Service: Cardiovascular;  Laterality: N/A;  . OTHER SURGICAL HISTORY Left 10/02/2017   biopsy of left eyelid  . TOTAL KNEE  ARTHROPLASTY  2006    FAMILY HISTORY Family History  Problem Relation Age of Onset  . Cancer Mother 26       GYN cancer (had hysterectomy)  . Heart disease Mother        CHF  . Cancer Father        liver  . Asthma Sister   . Breast cancer Sister 65  . Heart disease Brother        rheumatic heart attack  . Breast cancer Sister 36  . Asthma Brother   . Cancer Brother        Prostate  . Cancer Brother        Prostate    SOCIAL HISTORY Social History   Tobacco Use  . Smoking status: Never Smoker  . Smokeless tobacco: Never Used  Vaping Use  . Vaping Use: Never used  Substance Use Topics  . Alcohol use: No    Alcohol/week: 0.0 standard drinks  . Drug use: No         OPHTHALMIC EXAM: Base Eye Exam    Visual Acuity (Snellen - Linear)      Right Left   Dist cc 20/50 -2 20/30 -1   Dist ph cc NI NI   Correction: Glasses       Tonometry (Tonopen, 9:54 AM)      Right Left   Pressure 11 10       Pupils      Pupils Dark Light Shape React APD   Right PERRL 3 3 Round Minimal None   Left PERRL 3 3 Round Minimal None       Visual Fields (Counting fingers)      Left Right    Full Full       Neuro/Psych    Oriented x3: Yes   Mood/Affect: Normal       Dilation    Both eyes: 1.0% Mydriacyl, 2.5% Phenylephrine @ 9:54 AM        Slit Lamp and Fundus Exam    External Exam      Right Left   External Normal Normal       Slit Lamp Exam      Right Left   Lids/Lashes Normal Normal   Conjunctiva/Sclera White and quiet White and quiet   Cornea Clear Clear   Anterior Chamber Deep and quiet Deep and quiet   Iris Round and reactive Round and reactive   Lens Posterior chamber intraocular lens Posterior chamber intraocular lens   Anterior Vitreous Normal Normal       Fundus Exam      Right Left   Posterior Vitreous Posterior vitreous detachment Posterior vitreous detachment   Disc Normal Normal   C/D Ratio 0.45 0.45   Macula Soft drusen, no exudates,  Pigmented atrophy, Advanced age related macular degeneration, no macular thickening, Retinal pigment epithelial mottling Soft drusen, no exudates, Pigmented atrophy, Advanced age related macular degeneration, no macular thickening, Retinal pigment epithelial mottling, Epiretinal membrane No topographic distortion   Vessels Normal Normal   Periphery Normal Normal          IMAGING AND PROCEDURES  Imaging and Procedures for 08/09/20  OCT, Retina - OU - Both Eyes       Right Eye Quality was good. Scan locations included subfoveal. Central Foveal Thickness: 282. Progression has been stable. Findings include no SRF, abnormal foveal contour, no IRF, retinal drusen .   Left Eye Quality was good. Scan locations included subfoveal. Central Foveal Thickness: 321. Progression has been stable. Findings include no SRF, retinal drusen , abnormal foveal contour, no IRF, outer retinal atrophy, epiretinal membrane.   Notes Subfoveal RPE atrophy OS accounts for the atrophic change in the fovea and thinning of the foveal depression OS.  No signs of active CN VM and acuity has improved  Epiretinal membrane minor thickening nasal portion of macula left eye.                 ASSESSMENT/PLAN:  Advanced nonexudative age-related macular degeneration of left eye without subfoveal involvement Stable OU with no progression to CNVM  Intermediate stage nonexudative age-related macular degeneration of right eye No signs of CNVM today  Left epiretinal membrane Minor with no progression, nasal portion of macula OS      ICD-10-CM   1. Left epiretinal membrane  H35.372 OCT, Retina - OU - Both Eyes  2. Advanced nonexudative age-related macular degeneration of left eye without subfoveal involvement  H35.3123   3. Intermediate stage nonexudative age-related macular degeneration of right eye  H35.3112     1.  No anatomic nor clinical signs of progression of dry ARMD to wet ARMD OU  2.  Epiretinal  membrane nasal portion of macula and superiorly OS, no impact on acuity will continue to monitor.  3.  Ophthalmic Meds Ordered this visit:  No orders of the defined types were placed in this encounter.      Return in about 9 months (around 05/09/2021) for DILATE OU, OCT.  There are no Patient Instructions on file for this visit.   Explained the diagnoses, plan, and follow up with the patient and they expressed understanding.  Patient expressed understanding of the importance of proper follow up care.   Clent Demark Tobin Cadiente M.D. Diseases & Surgery of the Retina and Vitreous Retina & Diabetic [place] 08/09/20     Abbreviations: M myopia (nearsighted); A astigmatism; H hyperopia (farsighted); P presbyopia; Mrx spectacle prescription;  CTL contact lenses; OD right eye; OS left eye; OU both eyes  XT exotropia; ET esotropia; PEK punctate epithelial keratitis; PEE punctate epithelial erosions; DES dry eye syndrome; MGD meibomian gland dysfunction; ATs artificial tears; PFAT's preservative free artificial tears; [place] nuclear sclerotic cataract; PSC posterior subcapsular cataract; ERM epi-retinal membrane; PVD posterior vitreous detachment; RD retinal detachment; DM diabetes mellitus; DR diabetic retinopathy; NPDR non-proliferative diabetic retinopathy; PDR proliferative diabetic retinopathy; CSME clinically significant macular edema; DME diabetic macular edema; dbh dot blot hemorrhages; CWS cotton wool spot; POAG primary open angle glaucoma; C/D cup-to-disc ratio; HVF humphrey visual field; GVF goldmann visual field; OCT optical coherence tomography; IOP intraocular pressure; BRVO Branch retinal vein occlusion; CRVO central retinal vein occlusion; CRAO central retinal artery occlusion; BRAO branch retinal artery occlusion; RT retinal tear; SB scleral buckle; PPV pars plana vitrectomy;  VH Vitreous hemorrhage; PRP panretinal laser photocoagulation; IVK intravitreal kenalog; VMT vitreomacular traction; MH  Macular hole;  NVD neovascularization of the disc; NVE neovascularization elsewhere; AREDS age related eye disease study; ARMD age related macular degeneration; POAG primary open angle glaucoma; EBMD epithelial/anterior basement membrane dystrophy; ACIOL anterior chamber intraocular lens; IOL intraocular lens; PCIOL posterior chamber intraocular lens; Phaco/IOL phacoemulsification with intraocular lens placement; [place] photorefractive keratectomy; LASIK laser assisted in situ keratomileusis; HTN hypertension; DM diabetes mellitus; COPD chronic obstructive pulmonary disease

## 2020-08-11 DIAGNOSIS — Z23 Encounter for immunization: Secondary | ICD-10-CM | POA: Diagnosis not present

## 2020-08-23 ENCOUNTER — Telehealth: Payer: Self-pay | Admitting: Cardiology

## 2020-08-23 MED ORDER — FUROSEMIDE 40 MG PO TABS
40.0000 mg | ORAL_TABLET | Freq: Every day | ORAL | 3 refills | Status: DC
Start: 2020-08-23 — End: 2021-06-21

## 2020-08-23 NOTE — Telephone Encounter (Signed)
New message:    Patient daughter calling stating that she was putting her mother's medication together and realize that her mother has not been taking it and they can not find her medication and do not know what to do. The medication has been filled in January patient starting to swell. Please call patient daughter.

## 2020-08-23 NOTE — Telephone Encounter (Signed)
Spoke with daughter who reports she fixes her mother's medication a month at a time.  She last filled Furosemide 07/21/2020 per [facility] however daughter can not find the bottle of medication.  Advised daughter we can refill the medication however pt's insurance may not pay for it since it will be "too soon."  Daughter states understanding and states she will pay for it if she has to.  Advised Iwill send RX into pharmacy as requested.

## 2020-09-01 ENCOUNTER — Other Ambulatory Visit: Payer: Self-pay

## 2020-09-01 ENCOUNTER — Ambulatory Visit (INDEPENDENT_AMBULATORY_CARE_PROVIDER_SITE_OTHER): Payer: Medicare Other | Admitting: Cardiology

## 2020-09-01 ENCOUNTER — Encounter: Payer: Self-pay | Admitting: Cardiology

## 2020-09-01 VITALS — BP 130/50 | HR 48 | Ht 64.0 in | Wt 151.0 lb

## 2020-09-01 DIAGNOSIS — R609 Edema, unspecified: Secondary | ICD-10-CM | POA: Diagnosis not present

## 2020-09-01 DIAGNOSIS — I4891 Unspecified atrial fibrillation: Secondary | ICD-10-CM | POA: Diagnosis not present

## 2020-09-01 LAB — BASIC METABOLIC PANEL
BUN/Creatinine Ratio: 26 (ref 12–28)
BUN: 35 mg/dL (ref 10–36)
CO2: 22 mmol/L (ref 20–29)
Calcium: 9 mg/dL (ref 8.7–10.3)
Chloride: 101 mmol/L (ref 96–106)
Creatinine, Ser: 1.33 mg/dL — ABNORMAL HIGH (ref 0.57–1.00)
GFR calc Af Amer: 39 mL/min/{1.73_m2} — ABNORMAL LOW (ref >59)
GFR calc non Af Amer: 34 mL/min/{1.73_m2} — ABNORMAL LOW (ref >59)
Glucose: 102 mg/dL — ABNORMAL HIGH (ref 65–99)
Potassium: 4.2 mmol/L (ref 3.5–5.2)
Sodium: 139 mmol/L (ref 134–144)

## 2020-09-01 LAB — CBC
Hematocrit: 32.1 % — ABNORMAL LOW (ref 34.0–46.6)
Hemoglobin: 11.2 g/dL (ref 11.1–15.9)
MCH: 33.3 pg — ABNORMAL HIGH (ref 26.6–33.0)
MCHC: 34.9 g/dL (ref 31.5–35.7)
MCV: 96 fL (ref 79–97)
Platelets: 276 10*3/uL (ref 150–450)
RBC: 3.36 x10E6/uL — ABNORMAL LOW (ref 3.77–5.28)
RDW: 11.8 % (ref 11.7–15.4)
WBC: 6.6 10*3/uL (ref 3.4–10.8)

## 2020-09-01 NOTE — Patient Instructions (Signed)
Medication Instructions:  Your physician recommends that you continue on your current medications as directed. Please refer to the Current Medication list given to you today. *If you need a refill on your cardiac medications before your next appointment, please call your pharmacy*   Lab Work: Today: BMET, CBC If you have labs (blood work) drawn today and your tests are completely normal, you will receive your results only by: Marland Kitchen MyChart Message (if you have MyChart) OR . A paper copy in the mail If you have any lab test that is abnormal or we need to change your treatment, we will call you to review the results.   Testing/Procedures: none   Follow-Up: At [facility], you and your health needs are our priority.  As part of our continuing mission to provide you with exceptional heart care, we have created designated Provider Care Teams.  These Care Teams include your primary Cardiologist (physician) and Advanced Practice Providers (APPs -  Physician Assistants and Nurse Practitioners) who all work together to provide you with the care you need, when you need it.  We recommend signing up for the patient portal called "MyChart".  Sign up information is provided on this After Visit Summary.  MyChart is used to connect with patients for Virtual Visits (Telemedicine).  Patients are able to view lab/test results, encounter notes, upcoming appointments, etc.  Non-urgent messages can be sent to your provider as well.   To learn more about what you can do with MyChart, go to [email or web address].    Your next appointment:   6 month(s)  The format for your next appointment:   In Person  Provider:   You may see Candee Furbish, MD  or one of the following Advanced Practice Providers on your designated Care Team:    Kathyrn Drown, NP

## 2020-09-01 NOTE — Progress Notes (Signed)
Cardiology Office Note:    Date:  09/01/2020   ID:  Tammy Lloyd, DOB 19-Oct-1923, MRN [id number]  PCP:  Denita Lung, MD   [place]  Cardiologist:  Candee Furbish, MD  Advanced Practice Provider:  No care team member to display Electrophysiologist:  None       Referring MD: Denita Lung, MD     History of Present Illness:    Tammy Lloyd is a 87 y.o. female lower extremity edema follow-up.  Overall been doing quite well.  Wears TED hose.  Echo in 2021 showed severely elevated pulmonary pressures with normal EF.  No family history of coronary disease or heart failure.  No tobacco use.  She did note having some increased fluid recently and took additional 3 days of Lasix twice daily.  This helped significantly.  Overall she is lost about 20 pounds.  No chest pain fevers chills nausea vomiting syncope bleeding.  She does have bradycardia but she is asymptomatic.  Past Medical History:  Diagnosis Date  . Arthritis   . Cancer ([place])    Breast  . Cataract   . Colon polyp   . Edema of both legs   . Hemochromatosis    Followed by  hematology;  . Hemorrhoid   . Hypertension   . Idiopathic hemochromatosis 09/28/2011  . Lichen sclerosus   . Non-obstructive CAD (coronary artery disease)    a. 10/2011 Cath [facility]): LM nl, LAD 5/10ost, 10/15p, D1 nl, LCX 10/15ost, OM1/2 small, OM3 nl, RCA large/nl, RPDA/RPLV nl, EF 55-60%.  . Osteoporosis 2005   DEXA 2015 T score -1.7 stable from prior DEXA 2011 history of Fosamax 8 years. On drug-free holiday now  . Persistent atrial fibrillation (HCC)    a. CHA2DS2VASc = 5 -->Xarelto;  b. 07/2016 Echo: EF 55-60%, no rwma, mild MR, mild biatrial enlargement.  . Pulmonary hypertension ([place])   . Stroke [facility])    a. 07/2016 MRI: punctate acute ischemia in the R mid brain and possibly within the right pons w/o assoc hemorrhage or mass effect. Chronic microvascular ischemia.    Past Surgical History:  Procedure Laterality  Date  . BACK SURGERY  2008  . BREAST LUMPECTOMY     Right  . CATARACT EXTRACTION, BILATERAL    . COLONOSCOPY  2009  . DILATION AND CURETTAGE OF UTERUS  2005  . HYSTEROSCOPY  08/2003  . LEFT HEART CATHETERIZATION WITH CORONARY ANGIOGRAM N/A 10/24/2011   Procedure: LEFT HEART CATHETERIZATION WITH CORONARY ANGIOGRAM;  Surgeon: Clent Demark, MD;  Location: [facility] CATH LAB;  Service: Cardiovascular;  Laterality: N/A;  . OTHER SURGICAL HISTORY Left 10/02/2017   biopsy of left eyelid  . TOTAL KNEE ARTHROPLASTY  2006    Current Medications: Current Meds  Medication Sig  . acetaminophen (TYLENOL) 325 MG tablet Take 650 mg by mouth every 6 (six) hours as needed for headache (or pain). Reported on 10/14/2015  . amLODipine (NORVASC) 5 MG tablet Take 5 mg by mouth daily.  . clobetasol cream (TEMOVATE) 0.05 % Apply small amount nightly as needed for irritation  . furosemide (LASIX) 40 MG tablet Take 1 tablet (40 mg total) by mouth daily.  . hydrALAZINE (APRESOLINE) 10 MG tablet TAKE 1 TABLET BY MOUTH  TWICE DAILY  . Multiple Vitamins-Minerals (PRESERVISION AREDS 2) CAPS Take 1 tablet by mouth 2 (two) times daily.  . mupirocin ointment (BACTROBAN) 2 % Place 1 application into the nose 3 (three) times daily.  . pravastatin (  PRAVACHOL) 20 MG tablet TAKE 1 TABLET BY MOUTH AT  BEDTIME  . Prednicarbate 0.1 % CREA Apply to affected area twice daily prn itching.  . valsartan-hydrochlorothiazide (DIOVAN-HCT) 160-12.5 MG tablet TAKE 1 TABLET BY MOUTH  DAILY  . XARELTO 15 MG TABS tablet TAKE 1 TABLET BY MOUTH  DAILY WITH SUPPER     Allergies:   Tape   Social History   Socioeconomic History  . Marital status: Widowed    Spouse name: Not on file  . Number of children: Not on file  . Years of education: Not on file  . Highest education level: Not on file  Occupational History  . Not on file  Tobacco Use  . Smoking status: Never Smoker  . Smokeless tobacco: Never Used  Vaping Use  . Vaping Use: Never  used  Substance and Sexual Activity  . Alcohol use: No    Alcohol/week: 0.0 standard drinks  . Drug use: No  . Sexual activity: Never    Birth control/protection: Post-menopausal    Comment: 1st intercourse 87 yo-Fewer than 5 partners  Other Topics Concern  . Not on file  Social History Narrative   09/12/17 Lives alone. No pets   Social Determinants of Radio broadcast assistant Strain: Not on file  Food Insecurity: Not on file  Transportation Needs: Not on file  Physical Activity: Not on file  Stress: Not on file  Social Connections: Not on file     Family History: The patient's family history includes Asthma in her brother and sister; Breast cancer (age of onset: 2) in her sister; Breast cancer (age of onset: 18) in her sister; Cancer in her brother, brother, and father; Cancer (age of onset: 51) in her mother; Heart disease in her brother and mother.  ROS:   Please see the history of present illness.     All other systems reviewed and are negative.  EKGs/Labs/Other Studies Reviewed:      Recent Labs: 04/02/2020: BNP 711.4 09/01/2020: BUN 35; Creatinine, Ser 1.33; Hemoglobin WILL FOLLOW; Platelets WILL FOLLOW; Potassium 4.2; Sodium 139  Recent Lipid Panel    Component Value Date/Time   CHOL 135 02/25/2019 1029   TRIG 75 02/25/2019 1029   HDL 66 02/25/2019 1029   CHOLHDL 2.0 02/25/2019 1029   CHOLHDL 2.4 12/28/2016 1321   VLDL 20 12/28/2016 1321   LDLCALC 54 02/25/2019 1029     Risk Assessment/Calculations:      Physical Exam:    VS:  BP (!) 130/50 (BP Location: Left Arm, Patient Position: Sitting, Cuff Size: Normal)   Pulse (!) 48   Ht 5\' 4"  (1.626 m)   Wt 151 lb (68.5 kg)   SpO2 94%   BMI 25.92 kg/m     Wt Readings from Last 3 Encounters:  09/01/20 151 lb (68.5 kg)  04/27/20 144 lb (65.3 kg)  04/09/20 167 lb 12.8 oz (76.1 kg)     GEN:  Well nourished, well developed in no acute distress, in wheelchair here HEENT: Normal NECK: No JVD; No carotid  bruits LYMPHATICS: No lymphadenopathy CARDIAC: Irregularly irregular bradycardic, no murmurs, rubs, gallops RESPIRATORY:  Clear to auscultation without rales, wheezing or rhonchi  ABDOMEN: Soft, non-tender, non-distended MUSCULOSKELETAL:  1-2+ pedal edema; No deformity  SKIN: Warm and dry NEUROLOGIC:  Alert and oriented x 3 PSYCHIATRIC:  Normal affect   ASSESSMENT:    1. Edema, unspecified type   2. Atrial fibrillation, unspecified type ([place])    PLAN:    In  order of problems listed above:  Lower extremity edema -Much improved from original visit. -Continue with Lasix 40 mg once a day.  She does have the liberty to take Lasix 40 mg twice a day for 3 days if swelling increases. -She has done an excellent job of decreasing her weight approximately 20 pounds. -Checking lab work today.  Permanent atrial fibrillation -Rate is slightly slow, no rate controlling agents.  If bradycardia worsens, pacemaker.  Chronic anticoagulation -On Xarelto 15 mg dose adjusted.  Checking lab work today.  Essential hypertension -Much better control now get fluid under control  6 month follow up   Medication Adjustments/Labs and Tests Ordered: Current medicines are reviewed at length with the patient today.  Concerns regarding medicines are outlined above.  Orders Placed This Encounter  Procedures  . Basic metabolic panel  . CBC   No orders of the defined types were placed in this encounter.   Patient Instructions  Medication Instructions:  Your physician recommends that you continue on your current medications as directed. Please refer to the Current Medication list given to you today. *If you need a refill on your cardiac medications before your next appointment, please call your pharmacy*   Lab Work: Today: BMET, CBC If you have labs (blood work) drawn today and your tests are completely normal, you will receive your results only by: Marland Kitchen MyChart Message (if you have MyChart) OR . A  paper copy in the mail If you have any lab test that is abnormal or we need to change your treatment, we will call you to review the results.   Testing/Procedures: none   Follow-Up: At [facility], you and your health needs are our priority.  As part of our continuing mission to provide you with exceptional heart care, we have created designated Provider Care Teams.  These Care Teams include your primary Cardiologist (physician) and Advanced Practice Providers (APPs -  Physician Assistants and Nurse Practitioners) who all work together to provide you with the care you need, when you need it.  We recommend signing up for the patient portal called "MyChart".  Sign up information is provided on this After Visit Summary.  MyChart is used to connect with patients for Virtual Visits (Telemedicine).  Patients are able to view lab/test results, encounter notes, upcoming appointments, etc.  Non-urgent messages can be sent to your provider as well.   To learn more about what you can do with MyChart, go to [email or web address].    Your next appointment:   6 month(s)  The format for your next appointment:   In Person  Provider:   You may see Candee Furbish, MD  or one of the following Advanced Practice Providers on your designated Care Team:    Kathyrn Drown, NP         Signed, Candee Furbish, MD  09/01/2020 5:16 PM    [place]

## 2020-11-18 DIAGNOSIS — H0102A Squamous blepharitis right eye, upper and lower eyelids: Secondary | ICD-10-CM | POA: Diagnosis not present

## 2020-11-18 DIAGNOSIS — H02834 Dermatochalasis of left upper eyelid: Secondary | ICD-10-CM | POA: Diagnosis not present

## 2020-11-18 DIAGNOSIS — H35372 Puckering of macula, left eye: Secondary | ICD-10-CM | POA: Diagnosis not present

## 2020-11-18 DIAGNOSIS — Z961 Presence of intraocular lens: Secondary | ICD-10-CM | POA: Diagnosis not present

## 2020-11-18 DIAGNOSIS — H11823 Conjunctivochalasis, bilateral: Secondary | ICD-10-CM | POA: Diagnosis not present

## 2020-11-18 DIAGNOSIS — H04123 Dry eye syndrome of bilateral lacrimal glands: Secondary | ICD-10-CM | POA: Diagnosis not present

## 2020-11-18 DIAGNOSIS — H0102B Squamous blepharitis left eye, upper and lower eyelids: Secondary | ICD-10-CM | POA: Diagnosis not present

## 2020-11-18 DIAGNOSIS — H02831 Dermatochalasis of right upper eyelid: Secondary | ICD-10-CM | POA: Diagnosis not present

## 2020-11-18 DIAGNOSIS — H353132 Nonexudative age-related macular degeneration, bilateral, intermediate dry stage: Secondary | ICD-10-CM | POA: Diagnosis not present

## 2021-01-20 ENCOUNTER — Other Ambulatory Visit: Payer: Self-pay

## 2021-01-20 ENCOUNTER — Other Ambulatory Visit: Payer: Self-pay | Admitting: Family Medicine

## 2021-01-20 DIAGNOSIS — I10 Essential (primary) hypertension: Secondary | ICD-10-CM

## 2021-01-20 DIAGNOSIS — Z8673 Personal history of transient ischemic attack (TIA), and cerebral infarction without residual deficits: Secondary | ICD-10-CM

## 2021-01-20 DIAGNOSIS — E785 Hyperlipidemia, unspecified: Secondary | ICD-10-CM

## 2021-01-20 MED ORDER — PRAVASTATIN SODIUM 20 MG PO TABS: 20.0000 mg | ORAL_TABLET | Freq: Every day | ORAL | 0 refills | Status: DC

## 2021-03-17 ENCOUNTER — Other Ambulatory Visit: Payer: Self-pay | Admitting: Family Medicine

## 2021-03-17 DIAGNOSIS — I10 Essential (primary) hypertension: Secondary | ICD-10-CM

## 2021-03-25 ENCOUNTER — Other Ambulatory Visit: Payer: Self-pay | Admitting: Family Medicine

## 2021-03-25 DIAGNOSIS — E785 Hyperlipidemia, unspecified: Secondary | ICD-10-CM

## 2021-03-25 DIAGNOSIS — Z8673 Personal history of transient ischemic attack (TIA), and cerebral infarction without residual deficits: Secondary | ICD-10-CM

## 2021-04-04 ENCOUNTER — Encounter: Payer: Self-pay | Admitting: Family Medicine

## 2021-04-04 ENCOUNTER — Other Ambulatory Visit: Payer: Self-pay | Admitting: Family Medicine

## 2021-04-04 ENCOUNTER — Telehealth (INDEPENDENT_AMBULATORY_CARE_PROVIDER_SITE_OTHER): Payer: Medicare Other | Admitting: Family Medicine

## 2021-04-04 ENCOUNTER — Other Ambulatory Visit: Payer: Self-pay

## 2021-04-04 VITALS — Ht 65.0 in | Wt 151.0 lb

## 2021-04-04 DIAGNOSIS — M25561 Pain in right knee: Secondary | ICD-10-CM | POA: Diagnosis not present

## 2021-04-04 DIAGNOSIS — H259 Unspecified age-related cataract: Secondary | ICD-10-CM | POA: Diagnosis not present

## 2021-04-04 DIAGNOSIS — R609 Edema, unspecified: Secondary | ICD-10-CM

## 2021-04-04 DIAGNOSIS — M25562 Pain in left knee: Secondary | ICD-10-CM | POA: Diagnosis not present

## 2021-04-04 DIAGNOSIS — G8929 Other chronic pain: Secondary | ICD-10-CM | POA: Diagnosis not present

## 2021-04-04 DIAGNOSIS — E785 Hyperlipidemia, unspecified: Secondary | ICD-10-CM

## 2021-04-04 DIAGNOSIS — Z789 Other specified health status: Secondary | ICD-10-CM | POA: Diagnosis not present

## 2021-04-04 DIAGNOSIS — I4891 Unspecified atrial fibrillation: Secondary | ICD-10-CM

## 2021-04-04 DIAGNOSIS — Z8673 Personal history of transient ischemic attack (TIA), and cerebral infarction without residual deficits: Secondary | ICD-10-CM

## 2021-04-04 DIAGNOSIS — R262 Difficulty in walking, not elsewhere classified: Secondary | ICD-10-CM | POA: Diagnosis not present

## 2021-04-04 DIAGNOSIS — I10 Essential (primary) hypertension: Secondary | ICD-10-CM | POA: Diagnosis not present

## 2021-04-04 NOTE — Progress Notes (Signed)
   Subjective:    Patient ID: Tammy Lloyd, female    DOB: [birth year], 87 y.o.   MRN: [id number]  HPI Documentation for virtual audio and video telecommunications through [place] encounter: The patient was located at home. 2 patient identifiers used.  The provider was located in the office. The patient did consent to this visit and is aware of possible charges through their insurance for this visit. The other persons participating in this telemedicine service was her daughter Time spent on call was 8 minutes and in review of previous records >23 minutes total for counseling and coordination of care. This virtual service is not related to other E/M service within previous 7 days.  Today's virtual visit for medication check is mainly due to her immobility.  She does have severe arthritis and is using a walker which makes it difficult for her to get out of the house.  She continues on hydralazine, amlodipine, valsartan and having no difficulty with that.  They do not have a machine at home.  She is taking Pravachol without difficulty.  Continues on Xarelto 15 mg and has no difficulty with that.  Her immunizations were reviewed.  Review of Systems     Objective:   Physical Exam Alert and in no distress otherwise not examined       Assessment & Plan:  History of CVA (cerebrovascular accident)  Hyperlipidemia, unspecified hyperlipidemia type  Edema, unspecified type  Chronic pain of both knees  Decreased activities of daily living (ADL)  Impaired ambulation  Atrial fibrillation, unspecified type ([place])  Primary hypertension  Senile cataract, unspecified age-related cataract type, unspecified laterality Apparently at this time she does not need any medications renewed.  Did recommend that she get the by Valent COVID-vaccine as well as flu shot.  Recheck here next year at some point.

## 2021-04-11 ENCOUNTER — Other Ambulatory Visit: Payer: Self-pay | Admitting: Family Medicine

## 2021-05-09 ENCOUNTER — Ambulatory Visit (INDEPENDENT_AMBULATORY_CARE_PROVIDER_SITE_OTHER): Payer: Medicare Other | Admitting: Ophthalmology

## 2021-05-09 ENCOUNTER — Encounter (INDEPENDENT_AMBULATORY_CARE_PROVIDER_SITE_OTHER): Payer: Self-pay | Admitting: Ophthalmology

## 2021-05-09 ENCOUNTER — Other Ambulatory Visit: Payer: Self-pay

## 2021-05-09 DIAGNOSIS — H353112 Nonexudative age-related macular degeneration, right eye, intermediate dry stage: Secondary | ICD-10-CM

## 2021-05-09 DIAGNOSIS — H35372 Puckering of macula, left eye: Secondary | ICD-10-CM

## 2021-05-09 DIAGNOSIS — H353123 Nonexudative age-related macular degeneration, left eye, advanced atrophic without subfoveal involvement: Secondary | ICD-10-CM

## 2021-05-09 DIAGNOSIS — H353132 Nonexudative age-related macular degeneration, bilateral, intermediate dry stage: Secondary | ICD-10-CM

## 2021-05-09 NOTE — Assessment & Plan Note (Signed)
No signs of CNVM OD 

## 2021-05-09 NOTE — Progress Notes (Signed)
05/09/2021     CHIEF COMPLAINT Patient presents for  Chief Complaint  Patient presents with   Retina Follow Up      HISTORY OF PRESENT ILLNESS: Tammy Lloyd is a 87 y.o. female who presents to the clinic today for:   HPI     Retina Follow Up   Patient presents with  Dry AMD.  In both eyes.  This started 9 months ago.  Duration of 9 months.  Since onset it is stable.      Last edited by Reather Littler, COA on 05/09/2021  9:09 AM.      Referring physician: Denita Lung, [place],  [place] [id number]  HISTORICAL INFORMATION:   Selected notes from the MEDICAL RECORD NUMBER    Lab Results  Component Value Date   HGBA1C 5.2 07/21/2016     CURRENT MEDICATIONS: No current outpatient medications on file. (Ophthalmic Drugs)   No current facility-administered medications for this visit. (Ophthalmic Drugs)   Current Outpatient Medications (Other)  Medication Sig   acetaminophen (TYLENOL) 325 MG tablet Take 650 mg by mouth every 6 (six) hours as needed for headache (or pain). Reported on 10/14/2015   amLODipine (NORVASC) 5 MG tablet TAKE 1 TABLET BY MOUTH  DAILY   clobetasol cream (TEMOVATE) 0.05 % Apply small amount nightly as needed for irritation   furosemide (LASIX) 40 MG tablet Take 1 tablet (40 mg total) by mouth daily.   hydrALAZINE (APRESOLINE) 10 MG tablet TAKE 1 TABLET BY MOUTH  TWICE DAILY   Multiple Vitamins-Minerals (PRESERVISION AREDS 2) CAPS Take 1 tablet by mouth 2 (two) times daily.   mupirocin ointment (BACTROBAN) 2 % Place 1 application into the nose 3 (three) times daily.   pravastatin (PRAVACHOL) 20 MG tablet TAKE 1 TABLET BY MOUTH AT  BEDTIME   Prednicarbate 0.1 % CREA Apply to affected area twice daily prn itching. (Patient not taking: Reported on 04/04/2021)   valsartan-hydrochlorothiazide (DIOVAN-HCT) 160-12.5 MG tablet TAKE 1 TABLET BY MOUTH  DAILY   XARELTO 15 MG TABS tablet TAKE 1 TABLET BY MOUTH  DAILY WITH SUPPER   No  current facility-administered medications for this visit. (Other)      REVIEW OF SYSTEMS:    ALLERGIES Allergies  Allergen Reactions   Tape Other (See Comments)    Skin is thin and may tear or bruise easily    PAST MEDICAL HISTORY Past Medical History:  Diagnosis Date   Arthritis    Cancer ([place])    Breast   Cataract    Colon polyp    Edema of both legs    Hemochromatosis    Followed by  hematology;   Hemorrhoid    Hypertension    Idiopathic hemochromatosis 7/78/2423   Lichen sclerosus    Non-obstructive CAD (coronary artery disease)    a. 10/2011 Cath [facility]): LM nl, LAD 5/10ost, 10/15p, D1 nl, LCX 10/15ost, OM1/2 small, OM3 nl, RCA large/nl, RPDA/RPLV nl, EF 55-60%.   Osteoporosis 2005   DEXA 2015 T score -1.7 stable from prior DEXA 2011 history of Fosamax 8 years. On drug-free holiday now   Persistent atrial fibrillation ([place])    a. CHA2DS2VASc = 5 -->Xarelto;  b. 07/2016 Echo: EF 55-60%, no rwma, mild MR, mild biatrial enlargement.   Pulmonary hypertension ([place])    Stroke ([place])    a. 07/2016 MRI: punctate acute ischemia in the R mid brain and possibly within the right pons w/o assoc hemorrhage or mass effect.  Chronic microvascular ischemia.   Past Surgical History:  Procedure Laterality Date   BACK SURGERY  2008   BREAST LUMPECTOMY     Right   CATARACT EXTRACTION, BILATERAL     COLONOSCOPY  2009   DILATION AND CURETTAGE OF UTERUS  2005   HYSTEROSCOPY  08/2003   LEFT HEART CATHETERIZATION WITH CORONARY ANGIOGRAM N/A 10/24/2011   Procedure: LEFT HEART CATHETERIZATION WITH CORONARY ANGIOGRAM;  Surgeon: Clent Demark, MD;  Location: [facility] CATH LAB;  Service: Cardiovascular;  Laterality: N/A;   OTHER SURGICAL HISTORY Left 10/02/2017   biopsy of left eyelid   TOTAL KNEE ARTHROPLASTY  2006    FAMILY HISTORY Family History  Problem Relation Age of Onset   Cancer Mother 49       GYN cancer (had hysterectomy)   Heart disease Mother        CHF   Cancer Father         liver   Asthma Sister    Breast cancer Sister 55   Heart disease Brother        rheumatic heart attack   Breast cancer Sister 58   Asthma Brother    Cancer Brother        Prostate   Cancer Brother        Prostate    SOCIAL HISTORY Social History   Tobacco Use   Smoking status: Never   Smokeless tobacco: Never  Vaping Use   Vaping Use: Never used  Substance Use Topics   Alcohol use: No    Alcohol/week: 0.0 standard drinks   Drug use: No         OPHTHALMIC EXAM:  Base Eye Exam     Visual Acuity (ETDRS)       Right Left   Dist cc 20/60 +2 20/40 -2   Dist ph cc NI NI    Correction: Glasses         Tonometry (Tonopen, 9:15 AM)       Right Left   Pressure 6 11         Pupils       Pupils Dark Light Shape React APD   Right PERRL 3 3 Round Minimal None   Left PERRL 3 3 Round Minimal None         Visual Fields (Counting fingers)       Left Right    Full Full         Extraocular Movement       Right Left    Full, Ortho Full, Ortho         Neuro/Psych     Oriented x3: Yes   Mood/Affect: Normal         Dilation     Both eyes: 1.0% Mydriacyl, 2.5% Phenylephrine @ 9:15 AM           Slit Lamp and Fundus Exam     External Exam       Right Left   External Normal Normal         Slit Lamp Exam       Right Left   Lids/Lashes Normal Normal   Conjunctiva/Sclera White and quiet White and quiet   Cornea Clear Clear   Anterior Chamber Deep and quiet Deep and quiet   Iris Round and reactive Round and reactive   Lens Posterior chamber intraocular lens Posterior chamber intraocular lens   Anterior Vitreous Normal Normal         Fundus Exam  Right Left   Posterior Vitreous Posterior vitreous detachment Posterior vitreous detachment   Disc Normal Normal   C/D Ratio 0.45 0.45   Macula Soft drusen, no exudates, Pigmented atrophy, Advanced age related macular degeneration, no macular thickening, Retinal pigment  epithelial mottling Soft drusen, no exudates, Pigmented atrophy, Advanced age related macular degeneration, no macular thickening, Retinal pigment epithelial mottling, Epiretinal membrane No topographic distortion   Vessels Normal Normal   Periphery Normal Normal            IMAGING AND PROCEDURES  Imaging and Procedures for 05/09/21  OCT, Retina - OU - Both Eyes       Right Eye Quality was good. Scan locations included subfoveal. Central Foveal Thickness: 259. Progression has been stable. Findings include no SRF, abnormal foveal contour, no IRF, retinal drusen .   Left Eye Quality was good. Scan locations included subfoveal. Central Foveal Thickness: 321. Progression has been stable. Findings include no SRF, retinal drusen , abnormal foveal contour, no IRF, outer retinal atrophy, epiretinal membrane.   Notes Subfoveal RPE atrophy OS accounts for the atrophic change in the fovea and thinning of the foveal depression OS.  No signs of active CN VM and acuity has improved  Epiretinal membrane minor thickening nasal portion of macula left eye.  And now in a perifoveal location with the ERM yet outer retinal photoreceptor loss is account for acuity OS we will thus observe              ASSESSMENT/PLAN:  Advanced nonexudative age-related macular degeneration of left eye without subfoveal involvement Foveal atrophy accounts for acuity and stable finding on the OCT  Left epiretinal membrane Minor perifoveal no impact on acuity observe  Intermediate stage nonexudative age-related macular degeneration of right eye No signs of CNVM OD     ICD-10-CM   1. Intermediate stage nonexudative age-related macular degeneration of both eyes  H35.3132 OCT, Retina - OU - Both Eyes    2. Advanced nonexudative age-related macular degeneration of left eye without subfoveal involvement  H35.3123     3. Left epiretinal membrane  H35.372     4. Intermediate stage nonexudative age-related macular  degeneration of right eye  H35.3112       1.  OU with no signs of complications of dry AMD.  Some acuity change in the left eye from foveal atrophy.  2.  OS with minor epiretinal membrane no impact on acuity recommend observe  3.  Ophthalmic Meds Ordered this visit:  No orders of the defined types were placed in this encounter.      Return in about 9 months (around 02/06/2022) for DILATE OU, OCT.  There are no Patient Instructions on file for this visit.   Explained the diagnoses, plan, and follow up with the patient and they expressed understanding.  Patient expressed understanding of the importance of proper follow up care.   Clent Demark Esiah Bazinet M.D. Diseases & Surgery of the Retina and Vitreous Retina & Diabetic [place] 05/09/21     Abbreviations: M myopia (nearsighted); A astigmatism; H hyperopia (farsighted); P presbyopia; Mrx spectacle prescription;  CTL contact lenses; OD right eye; OS left eye; OU both eyes  XT exotropia; ET esotropia; PEK punctate epithelial keratitis; PEE punctate epithelial erosions; DES dry eye syndrome; MGD meibomian gland dysfunction; ATs artificial tears; PFAT's preservative free artificial tears; [place] nuclear sclerotic cataract; PSC posterior subcapsular cataract; ERM epi-retinal membrane; PVD posterior vitreous detachment; RD retinal detachment; DM diabetes mellitus; DR diabetic retinopathy; NPDR  non-proliferative diabetic retinopathy; PDR proliferative diabetic retinopathy; CSME clinically significant macular edema; DME diabetic macular edema; dbh dot blot hemorrhages; CWS cotton wool spot; POAG primary open angle glaucoma; C/D cup-to-disc ratio; HVF humphrey visual field; GVF goldmann visual field; OCT optical coherence tomography; IOP intraocular pressure; BRVO Branch retinal vein occlusion; CRVO central retinal vein occlusion; CRAO central retinal artery occlusion; BRAO branch retinal artery occlusion; RT retinal tear; SB scleral buckle; PPV pars  plana vitrectomy; VH Vitreous hemorrhage; PRP panretinal laser photocoagulation; IVK intravitreal kenalog; VMT vitreomacular traction; MH Macular hole;  NVD neovascularization of the disc; NVE neovascularization elsewhere; AREDS age related eye disease study; ARMD age related macular degeneration; POAG primary open angle glaucoma; EBMD epithelial/anterior basement membrane dystrophy; ACIOL anterior chamber intraocular lens; IOL intraocular lens; PCIOL posterior chamber intraocular lens; Phaco/IOL phacoemulsification with intraocular lens placement; [place] photorefractive keratectomy; LASIK laser assisted in situ keratomileusis; HTN hypertension; DM diabetes mellitus; COPD chronic obstructive pulmonary disease

## 2021-05-09 NOTE — Assessment & Plan Note (Signed)
Foveal atrophy accounts for acuity and stable finding on the OCT

## 2021-05-09 NOTE — Assessment & Plan Note (Signed)
Minor perifoveal no impact on acuity observe

## 2021-05-27 ENCOUNTER — Ambulatory Visit (INDEPENDENT_AMBULATORY_CARE_PROVIDER_SITE_OTHER): Payer: Medicare Other

## 2021-05-27 VITALS — Ht 66.0 in | Wt 145.0 lb

## 2021-05-27 DIAGNOSIS — Z0000 Encounter for general adult medical examination without abnormal findings: Secondary | ICD-10-CM

## 2021-05-27 NOTE — Progress Notes (Signed)
I connected with  Tammy Lloyd today via telehealth video enabled device and verified that I am speaking with the correct person using two identifiers.   Location: Patient: home Provider: work  Persons participating in virtual visit: Jaeleigh Monaco, Sharen Counter (daughter), Glenna Durand LPN  I discussed the limitations, risks, security and privacy concerns of performing an evaluation and management service by telephone and the availability of in person appointments. The patient expressed understanding and agreed to proceed.   Some vital signs may be absent or patient reported.     Subjective:   Tammy Lloyd is a 87 y.o. female who presents for Medicare Annual (Subsequent) preventive examination.  Review of Systems     Cardiac Risk Factors include: advanced age (>17men, >46 women);hypertension     Objective:    Today's Vitals   05/27/21 1202  Weight: 145 lb (65.8 kg)  Height: 5\' 6"  (1.676 m)   Body mass index is 23.4 kg/m.  Advanced Directives 05/27/2021 12/28/2016 07/21/2016 07/20/2016 06/19/2014 10/06/2013 10/24/2011  Does Patient Have a Medical Advance Directive? Yes Yes Yes Yes Yes Patient has advance directive, copy not in chart Patient has advance directive, copy not in chart  Type of Advance Directive [place];Living will - Living will Living will Living will Living will Living will  Does patient want to make changes to medical advance directive? - No - Patient declined No - Patient declined - No - Patient declined No change requested -  Copy of [place] in Chart? No - copy requested - - - No - copy requested - -    Current Medications (verified) Outpatient Encounter Medications as of 05/27/2021  Medication Sig   acetaminophen (TYLENOL) 325 MG tablet Take 650 mg by mouth every 6 (six) hours as needed for headache (or pain). Reported on 10/14/2015   amLODipine (NORVASC) 5 MG tablet TAKE 1 TABLET BY MOUTH  DAILY   furosemide (LASIX) 40 MG  tablet Take 1 tablet (40 mg total) by mouth daily.   hydrALAZINE (APRESOLINE) 10 MG tablet TAKE 1 TABLET BY MOUTH  TWICE DAILY   Multiple Vitamins-Minerals (PRESERVISION AREDS 2) CAPS Take 1 tablet by mouth 2 (two) times daily.   mupirocin ointment (BACTROBAN) 2 % Place 1 application into the nose 3 (three) times daily.   pravastatin (PRAVACHOL) 20 MG tablet TAKE 1 TABLET BY MOUTH AT  BEDTIME   valsartan-hydrochlorothiazide (DIOVAN-HCT) 160-12.5 MG tablet TAKE 1 TABLET BY MOUTH  DAILY   XARELTO 15 MG TABS tablet TAKE 1 TABLET BY MOUTH  DAILY WITH SUPPER   clobetasol cream (TEMOVATE) 0.05 % Apply small amount nightly as needed for irritation (Patient not taking: Reported on 05/27/2021)   Prednicarbate 0.1 % CREA Apply to affected area twice daily prn itching. (Patient not taking: Reported on 04/04/2021)   No facility-administered encounter medications on file as of 05/27/2021.    Allergies (verified) Tape   History: Past Medical History:  Diagnosis Date   Arthritis    Cancer ([place])    Breast   Cataract    Colon polyp    Edema of both legs    Hemochromatosis    Followed by  hematology;   Hemorrhoid    Hypertension    Idiopathic hemochromatosis 8/50/2774   Lichen sclerosus    Non-obstructive CAD (coronary artery disease)    a. 10/2011 Cath [facility]): LM nl, LAD 5/10ost, 10/15p, D1 nl, LCX 10/15ost, OM1/2 small, OM3 nl, RCA large/nl, RPDA/RPLV nl, EF 55-60%.  Osteoporosis 2005   DEXA 2015 T score -1.7 stable from prior DEXA 2011 history of Fosamax 8 years. On drug-free holiday now   Persistent atrial fibrillation ([place])    a. CHA2DS2VASc = 5 -->Xarelto;  b. 07/2016 Echo: EF 55-60%, no rwma, mild MR, mild biatrial enlargement.   Pulmonary hypertension ([place])    Stroke ([place])    a. 07/2016 MRI: punctate acute ischemia in the R mid brain and possibly within the right pons w/o assoc hemorrhage or mass effect. Chronic microvascular ischemia.   Past Surgical History:  Procedure Laterality  Date   BACK SURGERY  2008   BREAST LUMPECTOMY     Right   CATARACT EXTRACTION, BILATERAL     COLONOSCOPY  2009   DILATION AND CURETTAGE OF UTERUS  2005   HYSTEROSCOPY  08/2003   LEFT HEART CATHETERIZATION WITH CORONARY ANGIOGRAM N/A 10/24/2011   Procedure: LEFT HEART CATHETERIZATION WITH CORONARY ANGIOGRAM;  Surgeon: Clent Demark, MD;  Location: [facility] CATH [facility];  Service: Cardiovascular;  Laterality: N/A;   OTHER SURGICAL HISTORY Left 10/02/2017   biopsy of left eyelid   TOTAL KNEE ARTHROPLASTY  2006   Family History  Problem Relation Age of Onset   Cancer Mother 64       GYN cancer (had hysterectomy)   Heart disease Mother        CHF   Cancer Father        liver   Asthma Sister    Breast cancer Sister 68   Heart disease Brother        rheumatic heart attack   Breast cancer Sister 58   Asthma Brother    Cancer Brother        Prostate   Cancer Brother        Prostate   Social History   Socioeconomic History   Marital status: Widowed    Spouse name: Not on file   Number of children: Not on file   Years of education: Not on file   Highest education level: Not on file  Occupational History   Not on file  Tobacco Use   Smoking status: Never   Smokeless tobacco: Never  Vaping Use   Vaping Use: Never used  Substance and Sexual Activity   Alcohol use: No    Alcohol/week: 0.0 standard drinks   Drug use: No   Sexual activity: Not Currently    Birth control/protection: Post-menopausal    Comment: 1st intercourse 87 yo-Fewer than 5 partners  Other Topics Concern   Not on file  Social History Narrative   09/12/17 Lives alone. No pets   Social Determinants of Radio broadcast assistant Strain: Low Risk    Difficulty of Paying Living Expenses: Not hard at all  Food Insecurity: No Food Insecurity   Worried About Charity fundraiser in the Last Year: Never true   Arboriculturist in the Last Year: Never true  Transportation Needs: No Transportation Needs   Lack of  Transportation (Medical): No   Lack of Transportation (Non-Medical): No  Physical Activity: Inactive   Days of Exercise per Week: 0 days   Minutes of Exercise per Session: 0 min  Stress: No Stress Concern Present   Feeling of Stress : Not at all  Social Connections: Not on file    Tobacco Counseling Counseling given: Not Answered   Clinical Intake:  Pre-visit preparation completed: Yes  Pain : No/denies pain     Nutritional Status: BMI of 19-24  Normal Nutritional Risks: None Diabetes: No  How often do you need to have someone help you when you read instructions, pamphlets, or other written materials from your doctor or pharmacy?: 1 - Never  Diabetic? no  Interpreter Needed?: No  Information entered by :: NAllen LPN   Activities of Daily Living In your present state of health, do you have any difficulty performing the following activities: 05/27/2021 04/04/2021  Hearing? Tempie Donning  Vision? N N  Difficulty concentrating or making decisions? N N  Walking or climbing stairs? Y Y  Dressing or bathing? N N  Doing errands, shopping? Tempie Donning  Preparing Food and eating ? N -  Using the Toilet? N -  In the past six months, have you accidently leaked urine? Y -  Do you have problems with loss of bowel control? N -  Managing your Medications? Y -  Managing your Finances? Y -  Housekeeping or managing your Housekeeping? Y -  Some recent data might be hidden    Patient Care Team: Denita Lung, MD as PCP - General (Family Medicine) Jerline Pain, MD as PCP - Cardiology (Cardiology) Garvin Fila, MD as Consulting Physician (Neurology) Michael Boston, MD as Consulting Physician (General Surgery)  Indicate any recent Medical Services you may have received from other than [facility] providers in the past year (date may be approximate).     Assessment:   This is a routine wellness examination for [place].  Hearing/Vision screen Vision Screening - Comments:: Regular eye exams, Dr.  Katy Fitch, Dr. Zadie Rhine  Dietary issues and exercise activities discussed: Current Exercise Habits: The patient does not participate in regular exercise at present   Goals Addressed             This Visit's Progress    Patient Stated       05/27/2021, no goals       Depression Screen PHQ 2/9 Scores 05/27/2021 04/04/2021 02/25/2019 01/31/2018 12/28/2016 06/13/2016 02/23/2014  PHQ - 2 Score 0 0 0 0 0 0 0    Fall Risk Fall Risk  05/27/2021 02/25/2019 01/31/2018 09/12/2017 03/08/2017  Falls in the past year? 1 0 Yes No No  Comment legs gave out - - - -  Number falls in past yr: 0 - 2 or more - -  Comment - - - - -  Injury with Fall? 0 - No - -  Risk for fall due to : Impaired balance/gait;Impaired mobility;Medication side effect - Impaired balance/gait - -  Follow up - - Falls prevention discussed - -    FALL RISK PREVENTION PERTAINING TO THE HOME:  Any stairs in or around the home? Yes  If so, are there any without handrails? No  Home free of loose throw rugs in walkways, pet beds, electrical cords, etc? Yes  Adequate lighting in your home to reduce risk of falls? Yes   ASSISTIVE DEVICES UTILIZED TO PREVENT FALLS:  Life alert? Yes  Use of a cane, walker or w/c? Yes  Grab bars in the bathroom? No  Shower chair or bench in shower? Yes  Elevated toilet seat or a handicapped toilet? Yes   TIMED UP AND GO:  Was the test performed? No .      Cognitive Function:        Immunizations Immunization History  Administered Date(s) Administered   DTaP 01/19/1981, 06/20/1999   PFIZER(Purple Top)SARS-COV-2 Vaccination 09/18/2019, 10/07/2019, 08/11/2020   PPD Test 01/19/1981   Pneumococcal Conjugate-13 12/28/2016   Pneumococcal Polysaccharide-23 12/27/2001,  02/03/2013   Tdap 06/15/2016   Zoster Recombinat (Shingrix) 02/12/2017, 06/29/2017    TDAP status: Up to date  Flu Vaccine status: Declined, Education has been provided regarding the importance of this vaccine but patient  still declined. Advised may receive this vaccine at local pharmacy or Health Dept. Aware to provide a copy of the vaccination record if obtained from local pharmacy or Health Dept. Verbalized acceptance and understanding.  Pneumococcal vaccine status: Up to date  Covid-19 vaccine status: Completed vaccines  Qualifies for Shingles Vaccine? Yes   Zostavax completed Yes   Shingrix Completed?: Yes  Screening Tests Health Maintenance  Topic Date Due   COVID-19 Vaccine (4 - Booster for Pfizer series) 10/06/2020   TETANUS/TDAP  06/15/2026   Pneumonia Vaccine 32+ Years old  Completed   DEXA SCAN  Completed   Zoster Vaccines- Shingrix  Completed   HPV VACCINES  Aged Out   INFLUENZA VACCINE  Discontinued    Health Maintenance  Health Maintenance Due  Topic Date Due   COVID-19 Vaccine (4 - Booster for [place] series) 10/06/2020    Colorectal cancer screening: No longer required.   Mammogram status: No longer required due to age.  Bone Density status: Completed 05/14/2014  Lung Cancer Screening: (Low Dose CT Chest recommended if Age 79-80 years, 30 pack-year currently smoking OR have quit w/in 15years.) does not qualify.   Lung Cancer Screening Referral: no  Additional Screening:  Hepatitis C Screening: does not qualify;   Vision Screening: Recommended annual ophthalmology exams for early detection of glaucoma and other disorders of the eye. Is the patient up to date with their annual eye exam?  Yes  Who is the provider or what is the name of the office in which the patient attends annual eye exams? Dr. Katy Fitch , Dr. Zadie Rhine If pt is not established with a provider, would they like to be referred to a provider to establish care? No .   Dental Screening: Recommended annual dental exams for proper oral hygiene  Community Resource Referral / Chronic Care Management: CRR required this visit?  No   CCM required this visit?  No      Plan:     I have personally reviewed and noted  the following in the patient's chart:   Medical and social history Use of alcohol, tobacco or illicit drugs  Current medications and supplements including opioid prescriptions.  Functional ability and status Nutritional status Physical activity Advanced directives List of other physicians Hospitalizations, surgeries, and ER visits in previous 12 months Vitals Screenings to include cognitive, depression, and falls Referrals and appointments  In addition, I have reviewed and discussed with patient certain preventive protocols, quality metrics, and best practice recommendations. A written personalized care plan for preventive services as well as general preventive health recommendations were provided to patient.     Kellie Simmering, LPN   02/54/2706   Nurse Notes: 6 CIT not administered. Patient appeared cognitive per direct conversation.

## 2021-05-27 NOTE — Patient Instructions (Signed)
Tammy Lloyd , Thank you for taking time to come for your Medicare Wellness Visit. I appreciate your ongoing commitment to your health goals. Please review the following plan we discussed and let me know if I can assist you in the future.   Screening recommendations/referrals: Colonoscopy: not required Mammogram: not required Bone Density: completed 05/14/2014 Recommended yearly ophthalmology/optometry visit for glaucoma screening and checkup Recommended yearly dental visit for hygiene and checkup  Vaccinations: Influenza vaccine: decline Pneumococcal vaccine: completed 12/28/2016 Tdap vaccine: completed 06/15/2016, due 06/15/2026 Shingles vaccine: completed   Covid-19: 08/11/2020, 10/07/2019, 09/18/2019  Advanced directives: Please bring a copy of your POA (Power of Attorney) and/or Living Will to your next appointment.   Conditions/risks identified: none  Next appointment: Follow up in one year for your annual wellness visit    Preventive Care 65 Years and Older, Female Preventive care refers to lifestyle choices and visits with your health care provider that can promote health and wellness. What does preventive care include? A yearly physical exam. This is also called an annual well check. Dental exams once or twice a year. Routine eye exams. Ask your health care provider how often you should have your eyes checked. Personal lifestyle choices, including: Daily care of your teeth and gums. Regular physical activity. Eating a healthy diet. Avoiding tobacco and drug use. Limiting alcohol use. Practicing safe sex. Taking low-dose aspirin every day. Taking vitamin and mineral supplements as recommended by your health care provider. What happens during an annual well check? The services and screenings done by your health care provider during your annual well check will depend on your age, overall health, lifestyle risk factors, and family history of disease. Counseling  Your health care  provider may ask you questions about your: Alcohol use. Tobacco use. Drug use. Emotional well-being. Home and relationship well-being. Sexual activity. Eating habits. History of falls. Memory and ability to understand (cognition). Work and work environment. Reproductive health. Screening  You may have the following tests or measurements: Height, weight, and BMI. Blood pressure. Lipid and cholesterol levels. These may be checked every 5 years, or more frequently if you are over 50 years old. Skin check. Lung cancer screening. You may have this screening every year starting at age 55 if you have a 30-pack-year history of smoking and currently smoke or have quit within the past 15 years. Fecal occult blood test (FOBT) of the stool. You may have this test every year starting at age 50. Flexible sigmoidoscopy or colonoscopy. You may have a sigmoidoscopy every 5 years or a colonoscopy every 10 years starting at age 50. Hepatitis C blood test. Hepatitis B blood test. Sexually transmitted disease (STD) testing. Diabetes screening. This is done by checking your blood sugar (glucose) after you have not eaten for a while (fasting). You may have this done every 1-3 years. Bone density scan. This is done to screen for osteoporosis. You may have this done starting at age 65. Mammogram. This may be done every 1-2 years. Talk to your health care provider about how often you should have regular mammograms. Talk with your health care provider about your test results, treatment options, and if necessary, the need for more tests. Vaccines  Your health care provider may recommend certain vaccines, such as: Influenza vaccine. This is recommended every year. Tetanus, diphtheria, and acellular pertussis (Tdap, Td) vaccine. You may need a Td booster every 10 years. Zoster vaccine. You may need this after age 60. Pneumococcal 13-valent conjugate (PCV13) vaccine. One dose is   recommended after age  76. Pneumococcal polysaccharide (PPSV23) vaccine. One dose is recommended after age 10. Talk to your health care provider about which screenings and vaccines you need and how often you need them. This information is not intended to replace advice given to you by your health care provider. Make sure you discuss any questions you have with your health care provider. Document Released: 07/23/2015 Document Revised: 03/15/2016 Document Reviewed: 04/27/2015 Elsevier Interactive Patient Education  2017 [employer] Prevention in the Home Falls can cause injuries. They can happen to people of all ages. There are many things you can do to make your home safe and to help prevent falls. What can I do on the outside of my home? Regularly fix the edges of walkways and driveways and fix any cracks. Remove anything that might make you trip as you walk through a door, such as a raised step or threshold. Trim any bushes or trees on the path to your home. Use bright outdoor lighting. Clear any walking paths of anything that might make someone trip, such as rocks or tools. Regularly check to see if handrails are loose or broken. Make sure that both sides of any steps have handrails. Any raised decks and porches should have guardrails on the edges. Have any leaves, snow, or ice cleared regularly. Use sand or salt on walking paths during winter. Clean up any spills in your garage right away. This includes oil or grease spills. What can I do in the bathroom? Use night lights. Install grab bars by the toilet and in the tub and shower. Do not use towel bars as grab bars. Use non-skid mats or decals in the tub or shower. If you need to sit down in the shower, use a plastic, non-slip stool. Keep the floor dry. Clean up any water that spills on the floor as soon as it happens. Remove soap buildup in the tub or shower regularly. Attach bath mats securely with double-sided non-slip rug tape. Do not have throw  rugs and other things on the floor that can make you trip. What can I do in the bedroom? Use night lights. Make sure that you have a light by your bed that is easy to reach. Do not use any sheets or blankets that are too big for your bed. They should not hang down onto the floor. Have a firm chair that has side arms. You can use this for support while you get dressed. Do not have throw rugs and other things on the floor that can make you trip. What can I do in the kitchen? Clean up any spills right away. Avoid walking on wet floors. Keep items that you use a lot in easy-to-reach places. If you need to reach something above you, use a strong step stool that has a grab bar. Keep electrical cords out of the way. Do not use floor polish or wax that makes floors slippery. If you must use wax, use non-skid floor wax. Do not have throw rugs and other things on the floor that can make you trip. What can I do with my stairs? Do not leave any items on the stairs. Make sure that there are handrails on both sides of the stairs and use them. Fix handrails that are broken or loose. Make sure that handrails are as long as the stairways. Check any carpeting to make sure that it is firmly attached to the stairs. Fix any carpet that is loose or worn. Avoid having  throw rugs at the top or bottom of the stairs. If you do have throw rugs, attach them to the floor with carpet tape. Make sure that you have a light switch at the top of the stairs and the bottom of the stairs. If you do not have them, ask someone to add them for you. What else can I do to help prevent falls? Wear shoes that: Do not have high heels. Have rubber bottoms. Are comfortable and fit you well. Are closed at the toe. Do not wear sandals. If you use a stepladder: Make sure that it is fully opened. Do not climb a closed stepladder. Make sure that both sides of the stepladder are locked into place. Ask someone to hold it for you, if  possible. Clearly mark and make sure that you can see: Any grab bars or handrails. First and last steps. Where the edge of each step is. Use tools that help you move around (mobility aids) if they are needed. These include: Canes. Walkers. Scooters. Crutches. Turn on the lights when you go into a dark area. Replace any light bulbs as soon as they burn out. Set up your furniture so you have a clear path. Avoid moving your furniture around. If any of your floors are uneven, fix them. If there are any pets around you, be aware of where they are. Review your medicines with your doctor. Some medicines can make you feel dizzy. This can increase your chance of falling. Ask your doctor what other things that you can do to help prevent falls. This information is not intended to replace advice given to you by your health care provider. Make sure you discuss any questions you have with your health care provider. Document Released: 04/22/2009 Document Revised: 12/02/2015 Document Reviewed: 07/31/2014 Elsevier Interactive Patient Education  2017 [employer].

## 2021-06-14 ENCOUNTER — Telehealth: Payer: Self-pay | Admitting: Family Medicine

## 2021-06-14 NOTE — Chronic Care Management (AMB) (Signed)
  Chronic Care Management   Outreach Note  06/14/2021 Name: Tammy Lloyd MRN: [id number] DOB: [birth year]  Referred by: Denita Lung, MD Reason for referral : No chief complaint on file.   An unsuccessful telephone outreach was attempted today. The patient was referred to the pharmacist for assistance with care management and care coordination.   Follow Up Plan:   Tatjana Dellinger Upstream Scheduler

## 2021-06-20 ENCOUNTER — Other Ambulatory Visit: Payer: Self-pay | Admitting: Cardiology

## 2021-06-20 ENCOUNTER — Other Ambulatory Visit: Payer: Self-pay

## 2021-06-20 ENCOUNTER — Encounter: Payer: Self-pay | Admitting: Family Medicine

## 2021-06-20 ENCOUNTER — Telehealth: Payer: Self-pay

## 2021-06-20 MED ORDER — VALSARTAN-HYDROCHLOROTHIAZIDE 160-12.5 MG PO TABS: 1.0000 | ORAL_TABLET | Freq: Every day | ORAL | 0 refills | Status: DC

## 2021-06-20 NOTE — Telephone Encounter (Signed)
Pt. Daughter called stating that she was completely out of her valsartan/hctz. I told her it was [facility]21 for 90 day supply and she said they must have not given her the full amount of pills because she is completely out. If you could send a short term supply of the medicine to [place] and make sure [facility] can refill it for the long term.

## 2021-06-20 NOTE — Telephone Encounter (Signed)
Done KH 

## 2021-06-21 ENCOUNTER — Telehealth: Payer: Self-pay | Admitting: Family Medicine

## 2021-06-21 ENCOUNTER — Other Ambulatory Visit: Payer: Self-pay

## 2021-06-21 NOTE — Chronic Care Management (AMB) (Signed)
°  Chronic Care Management   Note  06/21/2021 Name: [name] MRN: [id number] DOB: Oct 06, 1923  [name] is a 87 y.o. year old female who is a primary care patient of [employer], Elyse Jarvis, MD. I reached out to [place] by phone today in response to a referral sent by Ms. Jeanmarie Plant Browder's PCP, Denita Lung, MD.   Ms. Schwenke was given information about Chronic Care Management services today including:  CCM service includes personalized support from designated clinical staff supervised by her physician, including individualized plan of care and coordination with other care providers 24/7 contact phone numbers for assistance for urgent and routine care needs. Service will only be billed when office clinical staff spend 20 minutes or more in a month to coordinate care. Only one practitioner may furnish and bill the service in a calendar month. The patient may stop CCM services at any time (effective at the end of the month) by phone call to the office staff.  JEANNIE WEST/ DAUGHTER verbally agreed to assistance and services provided by embedded care coordination/care management team today.   Follow up plan:   Tatjana Secretary/administrator

## 2021-06-28 ENCOUNTER — Other Ambulatory Visit: Payer: Self-pay | Admitting: Family Medicine

## 2021-08-12 ENCOUNTER — Telehealth: Payer: Self-pay | Admitting: Pharmacist

## 2021-08-12 NOTE — Chronic Care Management (AMB) (Signed)
° ° °  Chronic Care Management Pharmacy Assistant   Name: Tammy Lloyd  MRN: [id number] DOB: March 01, 1924  Reason for Encounter: Chart review for initial encounter with [name] Clinical Pharmacist on 08/18/21 at 3 pm in office.   Conditions to be addressed/monitored: Atrial Fibrillation, CAD, and HTN  Recent office visits:  05/27/21 Kellie Simmering, LPN - Patient presented for Medicare Annual Wellness Exam. No medication changes.  04/04/21 Denita Lung, MD - Patient presented for history of CVA and other concerns. No medication changes.  Recent consult visits:  08/12/21 Rankin, Clent Demark, MD (Ophthalmology) - Patient presented for Intermediate stage nonexudative age related macular degeneration of both eyes and other concerns. No medication changes.  Hospital visits:  None in previous 6 months  Medications: Outpatient Encounter Medications as of 08/12/2021  Medication Sig   acetaminophen (TYLENOL) 325 MG tablet Take 650 mg by mouth every 6 (six) hours as needed for headache (or pain). Reported on 10/14/2015   amLODipine (NORVASC) 5 MG tablet TAKE 1 TABLET BY MOUTH  DAILY   clobetasol cream (TEMOVATE) 0.05 % Apply small amount nightly as needed for irritation (Patient not taking: Reported on 05/27/2021)   furosemide (LASIX) 40 MG tablet Take 1 tablet (40 mg total) by mouth daily. Please schedule appointment for future refills. Thank you   hydrALAZINE (APRESOLINE) 10 MG tablet TAKE 1 TABLET BY MOUTH  TWICE DAILY   Multiple Vitamins-Minerals (PRESERVISION AREDS 2) CAPS Take 1 tablet by mouth 2 (two) times daily.   mupirocin ointment (BACTROBAN) 2 % Place 1 application into the nose 3 (three) times daily.   pravastatin (PRAVACHOL) 20 MG tablet TAKE 1 TABLET BY MOUTH AT  BEDTIME   Prednicarbate 0.1 % CREA Apply to affected area twice daily prn itching. (Patient not taking: Reported on 04/04/2021)   valsartan-hydrochlorothiazide (DIOVAN-HCT) 160-12.5 MG tablet Take 1 tablet by mouth daily.    XARELTO 15 MG TABS tablet TAKE 1 TABLET BY MOUTH  DAILY WITH SUPPER   No facility-administered encounter medications on file as of 08/12/2021.  Fill History : AMLODIPINE BESYLATE  5 MG TABS 07/28/2021 90   FUROSEMIDE  40 MG TABS 07/21/2020 90   HYDRALAZINE HCL  10 MG TABS 07/06/2021 90   MUPIROCIN  2 % OINT 04/02/2020 10   PRAVASTATIN SODIUM  20 MG TABS 07/13/2021 90   vit C,E-Zn-coppr-lutein-zeaxan (PreserVision AREDS-2) 250-90-40-1 mg capsule 11/18/2019 2   VALSARTAN/HYDROCHLOROTHIAZIDE  160-12.5 MG TABS 06/19/2021 90   XARELTO  15 MG TABS 07/06/2021 90      * Patient was unaware of appointment reports her daughter may have made for her, left her a MSG for return call did not reach to complete initial questions. Confirmed appointment with patient.  Care Gaps: BP- 130/50 ( 09/01/20) AWV- 05/27/21 COVID Booster - Overdue   Star Rating Drugs: Pravastatin 20 mg - Last filled 07/13/21 90 DS at [facility] Valsartan HCTZ - Last filled 06/19/21 90 DS at [facility] Pharmacist Assistant [phone]

## 2021-08-12 NOTE — Chronic Care Management (AMB) (Signed)
A user error has taken place: encounter opened in error, closed for administrative reasons.

## 2021-08-18 ENCOUNTER — Ambulatory Visit (INDEPENDENT_AMBULATORY_CARE_PROVIDER_SITE_OTHER): Payer: Medicare Other | Admitting: Pharmacist

## 2021-08-18 DIAGNOSIS — E785 Hyperlipidemia, unspecified: Secondary | ICD-10-CM

## 2021-08-18 DIAGNOSIS — I10 Essential (primary) hypertension: Secondary | ICD-10-CM

## 2021-08-18 NOTE — Progress Notes (Signed)
Chronic Care Management Pharmacy Note  08/18/2021 Name:  Tammy Lloyd MRN:  [id number] DOB:  [birth year]  Summary: Pt is due for annual labs  Recommendations/Changes made from today's visit: -Recommended moving amlodipine to the evening to even out BP lowering throughout the day -Recommended purchasing BP cuff and routinely checking at home to monitor for low readings  -Consider switching to Eliquis if patient qualifies for patient assistance  Plan: Follow up BP assessment in 3-4 months and check in on need for patient assistance   Subjective: Tammy Lloyd is an 88 y.o. year old female who is a primary patient of Denita Lung, MD.  The CCM team was consulted for assistance with disease management and care coordination needs.    Engaged with patient by telephone for initial visit in response to provider referral for pharmacy case management and/or care coordination services.   Consent to Services:  The patient was given the following information about Chronic Care Management services today, agreed to services, and gave verbal consent: 1. CCM service includes personalized support from designated clinical staff supervised by the primary care provider, including individualized plan of care and coordination with other care providers 2. 24/7 contact phone numbers for assistance for urgent and routine care needs. 3. Service will only be billed when office clinical staff spend 20 minutes or more in a month to coordinate care. 4. Only one practitioner may furnish and bill the service in a calendar month. 5.The patient may stop CCM services at any time (effective at the end of the month) by phone call to the office staff. 6. The patient will be responsible for cost sharing (co-pay) of up to 20% of the service fee (after annual deductible is met). Patient agreed to services and consent obtained.  Patient Care Team: Denita Lung, MD as PCP - General (Family Medicine) Jerline Pain, MD as PCP -  Cardiology (Cardiology) Garvin Fila, MD as Consulting Physician (Neurology) Michael Boston, MD as Consulting Physician (General Surgery) Viona Gilmore, [facility] as Pharmacist (Pharmacist)  Recent office visits: 05/27/21 Tammy Simmering, LPN - Patient presented for Medicare Annual Wellness Exam. No medication changes.   04/04/21 Denita Lung, MD - Patient presented for history of CVA and other concerns. No medication changes.  Recent consult visits: 08/12/21 Rankin, Clent Demark, MD (Ophthalmology) - Patient presented for Intermediate stage nonexudative age related macular degeneration of both eyes and other concerns. No medication changes.  Hospital visits: None in previous 6 months   Objective:  Lab Results  Component Value Date   CREATININE 1.33 (H) 09/01/2020   BUN 35 09/01/2020   GFRNONAA 34 (L) 09/01/2020   GFRAA 39 (L) 09/01/2020   NA 139 09/01/2020   K 4.2 09/01/2020   CALCIUM 9.0 09/01/2020   CO2 22 09/01/2020   GLUCOSE 102 (H) 09/01/2020    Lab Results  Component Value Date/Time   HGBA1C 5.2 07/21/2016 03:44 AM   HGBA1C 6.3 (H) 10/06/2013 12:07 AM    Last diabetic Eye exam:  Lab Results  Component Value Date/Time   HMDIABEYEEXA No Retinopathy 10/09/2013 12:00 AM    Last diabetic Foot exam: No results found for: HMDIABFOOTEX   Lab Results  Component Value Date   CHOL 135 02/25/2019   HDL 66 02/25/2019   LDLCALC 54 02/25/2019   TRIG 75 02/25/2019   CHOLHDL 2.0 02/25/2019    Hepatic Function Latest Ref Rng & Units 08/11/2019 02/25/2019 12/28/2016  Total Protein 6.0 - 8.5  g/dL 6.0 6.4 6.6  Albumin 3.5 - 4.6 g/dL 3.7 4.2 4.0  AST 0 - 40 IU/L _0 ALT 0 - 32 IU/L _1 Alk Phosphatase 39 - 117 IU/L 105 93 69  Total Bilirubin 0.0 - 1.2 mg/dL 0.3 0.3 0.5  Bilirubin, Direct 0.0 - 0.3 mg/dL - - -    Lab Results  Component Value Date/Time   TSH 4.574 (H) 10/06/2013 12:07 AM   TSH 3.601 10/24/2011 03:35 AM    CBC Latest Ref Rng & Units 09/01/2020  04/02/2020 08/11/2019  WBC 3.4 - 10.8 x10E3/uL 6.6 7.7 8.8  Hemoglobin 11.1 - 15.9 g/dL 11.2 11.3 10.2(L)  Hematocrit 34.0 - 46.6 % 32.1(L) 33.1(L) 29.5(L)  Platelets 150 - 450 x10E3/uL 276 333 434    No results found for: VD25OH  Clinical ASCVD: Yes  The ASCVD Risk score (Arnett DK, et al., 2019) failed to calculate for the following reasons:   The 2019 ASCVD risk score is only valid for ages 65 to 102    Depression screen PHQ 2/9 05/27/2021 04/04/2021 02/25/2019  Decreased Interest 0 0 0  Down, Depressed, Hopeless 0 0 0  PHQ - 2 Score 0 0 0     CHA2DS2/VAS Stroke Risk Points  Current as of 5 minutes ago     5 >= 2 Points: High Risk  1 - 1.99 Points: Medium Risk  0 Points: Low Risk    Last Change: N/A      Details    This score determines the patient's risk of having a stroke if the  patient has atrial fibrillation.       Points Metrics  0 Has Congestive Heart Failure:  No    Current as of 5 minutes ago  1 Has Vascular Disease:  Yes    Current as of 5 minutes ago  1 Has Hypertension:  Yes    Current as of 5 minutes ago  2 Age:  88    Current as of 5 minutes ago  0 Has Diabetes:  No    Current as of 5 minutes ago  0 Had Stroke:  No  Had TIA:  No  Had Thromboembolism:  No    Current as of 5 minutes ago  1 Female:  Yes    Current as of 5 minutes ago     Social History   Tobacco Use  Smoking Status Never  Smokeless Tobacco Never   BP Readings from Last 3 Encounters:  09/01/20 (!) 130/50  04/27/20 (!) 130/50  04/09/20 (!) 144/70   Pulse Readings from Last 3 Encounters:  09/01/20 (!) 48  04/27/20 (!) 58  04/09/20 (!) 51   Wt Readings from Last 3 Encounters:  05/27/21 145 lb (65.8 kg)  04/04/21 151 lb (68.5 kg)  09/01/20 151 lb (68.5 kg)   BMI Readings from Last 3 Encounters:  05/27/21 23.40 kg/m  04/04/21 25.13 kg/m  09/01/20 25.92 kg/m    Assessment/Interventions: Review of patient past medical history, allergies, medications, health status,  including review of consultants reports, laboratory and other test data, was performed as part of comprehensive evaluation and provision of chronic care management services.   SDOH:  (Social Determinants of Health) assessments and interventions performed: Yes SDOH Interventions    Flowsheet Row Most Recent Value  SDOH Interventions   Financial Strain Interventions Intervention Not Indicated  Transportation Interventions Intervention Not Indicated      SDOH Screenings   Alcohol Screen: Not on file  Depression (PHQ2-9): Low Risk    PHQ-2 Score: 0  Financial Resource Strain: Low Risk    Difficulty of Paying Living Expenses: Not very hard  Food Insecurity: No Food Insecurity   Worried About Charity fundraiser in the Last Year: Never true   Ran Out of Food in the Last Year: Never true  Housing: Not on file  Physical Activity: Inactive   Days of Exercise per Week: 0 days   Minutes of Exercise per Session: 0 min  Social Connections: Not on file  Stress: No Stress Concern Present   Feeling of Stress : Not at all  Tobacco Use: Low Risk    Smoking Tobacco Use: Never   Smokeless Tobacco Use: Never   Passive Exposure: Not on file  Transportation Needs: No Transportation Needs   Lack of Transportation (Medical): No   Lack of Transportation (Non-Medical): No   Patient's daughter reports her legs are not swollen right now and feels like her medications are all doing their job.  Patient's daughter lives with her and helps manage her medications. She fills a monthly pillbox for her. She reports the patient only misses doses every once in a while but not often. They had an issue initially with mail order but have gotten that all straightened out.  Patient pays $300-400 for her medications for 3 months at a time and patient feels like this is expensive. The most expensive is the Xarelto.  Discussed options for patient assistance and could consider Eliquis in the future once she reaches 3% out  of pocket minimum.   CCM Care Plan  Allergies  Allergen Reactions   Tape Other (See Comments)    Skin is thin and may tear or bruise easily    Medications Reviewed Today     Reviewed by Viona Gilmore, [facility] (Pharmacist) on 08/18/21 at 1527  Med List Status: <None>   Medication Order Taking? Sig Documenting Provider Last Dose Status Informant  acetaminophen (TYLENOL) 325 MG tablet [id number]  Take 650 mg by mouth every 6 (six) hours as needed for headache (or pain). Reported on 10/14/2015 [provider]  Active Self  amLODipine (NORVASC) 5 MG tablet [id number]  TAKE 1 TABLET BY MOUTH  DAILY Denita Lung, MD  Active   furosemide (LASIX) 40 MG tablet [id number] Yes Take 1 tablet (40 mg total) by mouth daily. Please schedule appointment for future refills. Thank you Jerline Pain, MD Taking Active   hydrALAZINE (APRESOLINE) 10 MG tablet [id number] Yes TAKE 1 TABLET BY MOUTH  TWICE DAILY Denita Lung, MD Taking Active   Multiple Vitamins-Minerals (PRESERVISION AREDS 2) CAPS [id number] Yes Take 1 tablet by mouth 2 (two) times daily. [provider] Taking Active   mupirocin ointment (BACTROBAN) 2 % [id number] Yes Place 1 application into the nose 3 (three) times daily. Tysinger, Camelia Eng, PA-C Taking Active   pravastatin (PRAVACHOL) 20 MG tablet [id number] Yes TAKE 1 TABLET BY MOUTH AT  BEDTIME Denita Lung, MD Taking Active   valsartan-hydrochlorothiazide (DIOVAN-HCT) 160-12.5 MG tablet [id number] Yes Take 1 tablet by mouth daily. Denita Lung, MD Taking Active   XARELTO 15 MG TABS tablet [id number] Yes TAKE 1 TABLET BY MOUTH  DAILY WITH SUPPER Denita Lung, MD Taking Active             Patient Active Problem List   Diagnosis Date Noted   Age-related cataract 04/04/2021   Encounter for long-term (current) use of non-steroidal anti-inflammatories 04/02/2020  Chronic pain of both knees 04/02/2020   Knee swelling 04/02/2020   Decreased range of motion of both  knees 04/02/2020   Fall 04/02/2020   Edema 04/02/2020   Decreased activities of daily living (ADL) 04/02/2020   Impaired ambulation 04/02/2020   Frequent nosebleeds 04/02/2020   Fatigue 04/02/2020   Left epiretinal membrane 01/15/2020   Intermediate stage nonexudative age-related macular degeneration of right eye 01/15/2020   Advanced nonexudative age-related macular degeneration of left eye without subfoveal involvement 01/15/2020   Macular degeneration 02/25/2019   Presbycusis of both ears 12/28/2016   History of CVA (cerebrovascular accident) 07/20/2016   Abnormality of gait 10/06/2013   Meningioma ([place]) 10/05/2013   Atrial fibrillation ([place]) 10/05/2013   CAD (coronary artery disease) 10/23/2011   Idiopathic hemochromatosis 09/28/2011   Hypertension 12/19/2010   Arthritis 12/19/2010   History of breast cancer 12/19/2010    Immunization History  Administered Date(s) Administered   DTaP 01/19/1981, 06/20/1999   PFIZER(Purple Top)SARS-COV-2 Vaccination 09/18/2019, 10/07/2019, 08/11/2020   PPD Test 01/19/1981   Pneumococcal Conjugate-13 12/28/2016   Pneumococcal Polysaccharide-23 12/27/2001, 02/03/2013   Tdap 06/15/2016   Zoster Recombinat (Shingrix) 02/12/2017, 06/29/2017    Conditions to be addressed/monitored:  Hypertension, Hyperlipidemia, and Atrial Fibrillation  Care Plan : CCM Pharmacy Care Plan  Updates made by Viona Gilmore, [place] since 08/18/2021 12:00 AM     Problem: Problem: Hypertension, Hyperlipidemia, and Atrial Fibrillation      Long-Range Goal: Patient-Specific Goal   Start Date: 08/18/2021  Expected End Date: 08/18/2022  This Visit's Progress: On track  Priority: High  Note:   Current Barriers:  Unable to independently monitor therapeutic efficacy  Pharmacist Clinical Goal(s):  Patient will achieve adherence to monitoring guidelines and medication adherence to achieve therapeutic efficacy through collaboration with PharmD and provider.    Interventions: 1:1 collaboration with Denita Lung, MD regarding development and update of comprehensive plan of care as evidenced by provider attestation and co-signature Inter-disciplinary care team collaboration (see longitudinal plan of care) Comprehensive medication review performed; medication list updated in electronic medical record  Hypertension (BP goal <140/90) -Controlled -Current treatment: Amlodipine 5 mg 1 tablet daily - Appropriate, Effective, Safe, Accessible Hydralazine 10 mg 1 tablet twice daily - Appropriate, Effective, Safe, Accessible Valsartan-HCTZ 160-12.5 mg 1 tablet daily - Appropriate, Effective, Safe, Accessible -Medications previously tried: none  -Current home readings: doesn't check her BP at home (don't have a BP cuff) -Current dietary habits: she does the cooking; some takeout but mostly cooking; does use some salt; canned vegetables (green beans or corns) - low sodium -Current exercise habits: only talks to the bathroom and bedroom and to the kitchen; uses her walker -Denies hypotensive/hypertensive symptoms -Educated on BP goals and benefits of medications for prevention of heart attack, stroke and kidney damage; Importance of home blood pressure monitoring; Symptoms of hypotension and importance of maintaining adequate hydration; -Counseled to monitor BP at home weekly, document, and provide log at future appointments -Recommended to continue current medication Recommended moving amlodipine to the evening to even out BP lowering.  Swelling (Goal: minimize symptoms) -Controlled -Current treatment  Furosemide 40 mg 1 tablet daily - Appropriate, Effective, Safe, Accessible -Medications previously tried: none  -Recommended to continue current medication  Hyperlipidemia: (LDL goal < 70) -Controlled -Current treatment: Pravastatin 20 mg 1 tablet daily at bedtime - Appropriate, Effective, Safe, Accessible -Medications previously tried: none   -Current dietary patterns: did not discuss -Current exercise habits: unable to -Educated on Cholesterol goals;  Benefits of statin  for ASCVD risk reduction; -Counseled on diet and exercise extensively Recommended to continue current medication Recommended repeat lipid panel.  Atrial Fibrillation (Goal: prevent stroke and major bleeding) -Controlled -CHADSVASC: 5 -Current treatment: Rate control: none Anticoagulation: Xarelto 15 mg 1 tablet daily - Appropriate, Effective, Safe, Query accessible  -Medications previously tried: none -Home BP and HR readings: could not provide  -Counseled on importance of adherence to anticoagulant exactly as prescribed; bleeding risk associated with Xarelto and importance of self-monitoring for signs/symptoms of bleeding; avoidance of NSAIDs due to increased bleeding risk with anticoagulants; -Recommended to continue current medication Assessed patient finances. Consider applying for Eliquis PAP later this year.  Health Maintenance -Vaccine gaps: COVID booster -Current therapy:  Mupirocin ointment 2% as needed Preservision Areds 2 1 tablet twice daily Acetaminophen 500 mg 1 tablet as needed -Educated on Cost vs benefit of each product must be carefully weighed by individual consumer -Patient is satisfied with current therapy and denies issues -Recommended to continue current medication   Patient Goals/Self-Care Activities Patient will:  - take medications as prescribed as evidenced by patient report and record review check blood pressure a few times a month, document, and provide at future appointments  Follow Up Plan: The care management team will reach out to the patient again over the next 90 days.       Medication Assistance: None required.  Patient affirms current coverage meets needs.  Compliance/Adherence/Medication fill history: Care Gaps: COVID booster BP- 130/50 ( 09/01/20)  Star-Rating Drugs: Pravastatin 20 mg - Last filled  07/13/21 90 DS at [facility] Valsartan HCTZ - Last filled 06/19/21 90 DS at [facility]    Patient's preferred pharmacy is:  [place], [address] [place] [address] Phone: [phone] Fax: [phone]  [facility] ([place], [place] [facility] [address] [place] [address] [place] [address] Phone: [phone] Fax: [phone]  [facility] ([facility] ) - [place], [place] [address] Phone: [phone] Fax: [phone]  Uses pill box? Yes Pt endorses 99% compliance  We discussed: Current pharmacy is preferred with insurance plan and patient is satisfied with pharmacy services Patient decided to: Continue current medication management strategy  Care Plan and Follow Up Patient Decision:  Patient agrees to Care Plan and Follow-up.  Plan: The care management team will reach out to the patient again over the next 90 days.  [name], PharmD, [facility] [phone]

## 2021-08-18 NOTE — Patient Instructions (Signed)
Hi Tammy and Tammy Lloyd,  It was great to get to meet you over the telephone! Below is a summary of some of the topics we discussed.   Please try to get a blood pressure cuff as we discussed to make sure she isn't having any lower readings. Also, go ahead and move the amlodipine to the evening to even out blood pressure lowering throughout the day.  Please reach out to me if you have any questions or need anything!  Best, Maddie  [name], PharmD, [facility] [phone]   Visit Information   Goals Addressed             This Visit's Progress    Track and Manage My Blood Pressure-Hypertension       Timeframe:  Long-Range Goal Priority:  Medium Start Date:                             Expected End Date:                       Follow Up Date 11/15/21    - check blood pressure weekly - choose a place to take my blood pressure (home, clinic or office, retail store)    Why is this important?   You won't feel high blood pressure, but it can still hurt your blood vessels.  High blood pressure can cause heart or kidney problems. It can also cause a stroke.  Making lifestyle changes like losing a little weight or eating less salt will help.  Checking your blood pressure at home and at different times of the day can help to control blood pressure.  If the doctor prescribes medicine remember to take it the way the doctor ordered.  Call the office if you cannot afford the medicine or if there are questions about it.     Notes:        Patient Care Plan: CCM Pharmacy Care Plan     Problem Identified: Problem: Hypertension, Hyperlipidemia, and Atrial Fibrillation      Long-Range Goal: Patient-Specific Goal   Start Date: 08/18/2021  Expected End Date: 08/18/2022  This Visit's Progress: On track  Priority: High  Note:   Current Barriers:  Unable to independently monitor therapeutic efficacy  Pharmacist Clinical Goal(s):  Patient will  achieve adherence to monitoring guidelines and medication adherence to achieve therapeutic efficacy through collaboration with PharmD and provider.   Interventions: 1:1 collaboration with Denita Lung, MD regarding development and update of comprehensive plan of care as evidenced by provider attestation and co-signature Inter-disciplinary care team collaboration (see longitudinal plan of care) Comprehensive medication review performed; medication list updated in electronic medical record  Hypertension (BP goal <140/90) -Controlled -Current treatment: Amlodipine 5 mg 1 tablet daily - Appropriate, Effective, Safe, Accessible Hydralazine 10 mg 1 tablet twice daily - Appropriate, Effective, Safe, Accessible Valsartan-HCTZ 160-12.5 mg 1 tablet daily - Appropriate, Effective, Safe, Accessible -Medications previously tried: none  -Current home readings: doesn't check her BP at home (don't have a BP cuff) -Current dietary habits: she does the cooking; some takeout but mostly cooking; does use some salt; canned vegetables (green beans or corns) - low sodium -Current exercise habits: only talks to the bathroom and bedroom and to the kitchen; uses her walker -Denies hypotensive/hypertensive symptoms -Educated on BP goals and benefits of medications for prevention of heart attack, stroke and kidney damage; Importance of home blood  pressure monitoring; Symptoms of hypotension and importance of maintaining adequate hydration; -Counseled to monitor BP at home weekly, document, and provide log at future appointments -Recommended to continue current medication Recommended moving amlodipine to the evening to even out BP lowering.  Swelling (Goal: minimize symptoms) -Controlled -Current treatment  Furosemide 40 mg 1 tablet daily - Appropriate, Effective, Safe, Accessible -Medications previously tried: none  -Recommended to continue current medication  Hyperlipidemia: (LDL goal <  70) -Controlled -Current treatment: Pravastatin 20 mg 1 tablet daily at bedtime - Appropriate, Effective, Safe, Accessible -Medications previously tried: none  -Current dietary patterns: did not discuss -Current exercise habits: unable to -Educated on Cholesterol goals;  Benefits of statin for ASCVD risk reduction; -Counseled on diet and exercise extensively Recommended to continue current medication Recommended repeat lipid panel.  Atrial Fibrillation (Goal: prevent stroke and major bleeding) -Controlled -CHADSVASC: 5 -Current treatment: Rate control: none Anticoagulation: Xarelto 15 mg 1 tablet daily - Appropriate, Effective, Safe, Query accessible  -Medications previously tried: none -Home BP and HR readings: could not provide  -Counseled on importance of adherence to anticoagulant exactly as prescribed; bleeding risk associated with Xarelto and importance of self-monitoring for signs/symptoms of bleeding; avoidance of NSAIDs due to increased bleeding risk with anticoagulants; -Recommended to continue current medication Assessed patient finances. Consider applying for Eliquis PAP later this year.  Health Maintenance -Vaccine gaps: COVID booster -Current therapy:  Mupirocin ointment 2% as needed Preservision Areds 2 1 tablet twice daily Acetaminophen 500 mg 1 tablet as needed -Educated on Cost vs benefit of each product must be carefully weighed by individual consumer -Patient is satisfied with current therapy and denies issues -Recommended to continue current medication   Patient Goals/Self-Care Activities Patient will:  - take medications as prescribed as evidenced by patient report and record review check blood pressure a few times a month, document, and provide at future appointments  Follow Up Plan: The care management team will reach out to the patient again over the next 90 days.       Tammy Lloyd was given information about Chronic Care Management services  today including:  CCM service includes personalized support from designated clinical staff supervised by her physician, including individualized plan of care and coordination with other care providers 24/7 contact phone numbers for assistance for urgent and routine care needs. Standard insurance, coinsurance, copays and deductibles apply for chronic care management only during months in which we provide at least 20 minutes of these services. Most insurances cover these services at 100%, however patients may be responsible for any copay, coinsurance and/or deductible if applicable. This service may help you avoid the need for more expensive face-to-face services. Only one practitioner may furnish and bill the service in a calendar month. The patient may stop CCM services at any time (effective at the end of the month) by phone call to the office staff.  Patient agreed to services and verbal consent obtained.   Patient verbalizes understanding of instructions and care plan provided today and agrees to view in [place]. Active MyChart status confirmed with patient.   The pharmacy team will reach out to the patient again over the next 90 days.   Viona Gilmore, [facility]

## 2021-08-30 ENCOUNTER — Other Ambulatory Visit: Payer: Self-pay | Admitting: Family Medicine

## 2021-08-30 DIAGNOSIS — I10 Essential (primary) hypertension: Secondary | ICD-10-CM

## 2021-09-06 ENCOUNTER — Other Ambulatory Visit: Payer: Self-pay | Admitting: Family Medicine

## 2021-09-06 DIAGNOSIS — Z8673 Personal history of transient ischemic attack (TIA), and cerebral infarction without residual deficits: Secondary | ICD-10-CM

## 2021-09-06 DIAGNOSIS — E785 Hyperlipidemia, unspecified: Secondary | ICD-10-CM

## 2021-09-06 DIAGNOSIS — I10 Essential (primary) hypertension: Secondary | ICD-10-CM

## 2021-09-07 ENCOUNTER — Telehealth: Payer: Self-pay | Admitting: Family Medicine

## 2021-09-07 NOTE — Telephone Encounter (Signed)
Pt daughter called and states that she is having a hard time getting pt in and out of the bed and getting her around, she is wanting to know if she could get a rx for a hoyer lift to help,informed pt daughter dr Redmond School was out of town  ? ?She can be reached at [phone] ?

## 2021-09-08 ENCOUNTER — Other Ambulatory Visit: Payer: Self-pay

## 2021-09-08 DIAGNOSIS — Z7409 Other reduced mobility: Secondary | ICD-10-CM

## 2021-09-08 NOTE — Telephone Encounter (Signed)
Referral was placed. [place] ?

## 2021-09-13 ENCOUNTER — Telehealth: Payer: Self-pay

## 2021-09-13 DIAGNOSIS — Z7409 Other reduced mobility: Secondary | ICD-10-CM | POA: Diagnosis not present

## 2021-09-13 DIAGNOSIS — I4891 Unspecified atrial fibrillation: Secondary | ICD-10-CM | POA: Diagnosis not present

## 2021-09-13 DIAGNOSIS — I2510 Atherosclerotic heart disease of native coronary artery without angina pectoris: Secondary | ICD-10-CM | POA: Diagnosis not present

## 2021-09-13 DIAGNOSIS — E785 Hyperlipidemia, unspecified: Secondary | ICD-10-CM | POA: Diagnosis not present

## 2021-09-13 DIAGNOSIS — I10 Essential (primary) hypertension: Secondary | ICD-10-CM | POA: Diagnosis not present

## 2021-09-13 DIAGNOSIS — H3530 Unspecified macular degeneration: Secondary | ICD-10-CM | POA: Diagnosis not present

## 2021-09-13 DIAGNOSIS — M1380 Other specified arthritis, unspecified site: Secondary | ICD-10-CM | POA: Diagnosis not present

## 2021-09-13 DIAGNOSIS — Z7901 Long term (current) use of anticoagulants: Secondary | ICD-10-CM | POA: Diagnosis not present

## 2021-09-13 NOTE — Telephone Encounter (Signed)
Tammy Lloyd is requesting to have  P/T 2x a week for 3 weeks also she is requesting  script for hoyer lift to transfer pt from bed to chair. Please advise [place] ?

## 2021-09-15 ENCOUNTER — Other Ambulatory Visit: Payer: Self-pay

## 2021-09-15 ENCOUNTER — Telehealth: Payer: Self-pay

## 2021-09-15 NOTE — Telephone Encounter (Signed)
Daughter called and said they had a really rough night and wanted to know if there was a way to get a rush on the [place] lift, they really need it. Maudie Mercury has a message into Dr. Redmond School but he is on vacation, (09/13/21 Desmond Lope is requesting to have  P/T 2x a week for 3 weeks also she is requesting  script for hoyer lift to transfer pt from bed to chair. Please advise [facility]) Audelia Acton can you approve this and send back to [employer]

## 2021-09-15 NOTE — Telephone Encounter (Signed)
Verbal given and script was written and awaiting sig. [place] ?

## 2021-09-16 ENCOUNTER — Telehealth: Payer: Self-pay

## 2021-09-16 DIAGNOSIS — Z7409 Other reduced mobility: Secondary | ICD-10-CM | POA: Diagnosis not present

## 2021-09-16 DIAGNOSIS — H3530 Unspecified macular degeneration: Secondary | ICD-10-CM | POA: Diagnosis not present

## 2021-09-16 DIAGNOSIS — I2510 Atherosclerotic heart disease of native coronary artery without angina pectoris: Secondary | ICD-10-CM | POA: Diagnosis not present

## 2021-09-16 DIAGNOSIS — M1380 Other specified arthritis, unspecified site: Secondary | ICD-10-CM | POA: Diagnosis not present

## 2021-09-16 DIAGNOSIS — I4891 Unspecified atrial fibrillation: Secondary | ICD-10-CM | POA: Diagnosis not present

## 2021-09-16 DIAGNOSIS — I10 Essential (primary) hypertension: Secondary | ICD-10-CM | POA: Diagnosis not present

## 2021-09-16 NOTE — Telephone Encounter (Signed)
Brad form [facility] called to let you know that the pt. Had a fall yesterday and has a small abrasion on her elbow but other then that she is ok.  ?

## 2021-09-16 NOTE — Telephone Encounter (Signed)
Sonja from [facility] is aware of the verbal order.  ?

## 2021-09-16 NOTE — Telephone Encounter (Signed)
Spoke to Dr. Redmond School and advised that order has been requested by home health and info was sent in to obtain lift. [place] ?

## 2021-09-16 NOTE — Telephone Encounter (Signed)
Tammy Lloyd from [facility] called they wanted a verbal order to treat the pt. For a skin tear on her elbow were she fell yesterday. I am sending to you since Dr. Redmond School is out of the office. I can call her back if ok at [phone]. ?

## 2021-09-17 ENCOUNTER — Other Ambulatory Visit: Payer: Self-pay | Admitting: Cardiology

## 2021-09-19 DIAGNOSIS — M1380 Other specified arthritis, unspecified site: Secondary | ICD-10-CM | POA: Diagnosis not present

## 2021-09-19 DIAGNOSIS — Z7409 Other reduced mobility: Secondary | ICD-10-CM | POA: Diagnosis not present

## 2021-09-19 DIAGNOSIS — I2510 Atherosclerotic heart disease of native coronary artery without angina pectoris: Secondary | ICD-10-CM | POA: Diagnosis not present

## 2021-09-19 DIAGNOSIS — I4891 Unspecified atrial fibrillation: Secondary | ICD-10-CM | POA: Diagnosis not present

## 2021-09-19 DIAGNOSIS — H3530 Unspecified macular degeneration: Secondary | ICD-10-CM | POA: Diagnosis not present

## 2021-09-19 DIAGNOSIS — I10 Essential (primary) hypertension: Secondary | ICD-10-CM | POA: Diagnosis not present

## 2021-09-19 MED ORDER — FUROSEMIDE 40 MG PO TABS: 40.0000 mg | ORAL_TABLET | Freq: Every day | ORAL | 0 refills | Status: DC

## 2021-09-20 ENCOUNTER — Telehealth (INDEPENDENT_AMBULATORY_CARE_PROVIDER_SITE_OTHER): Payer: Medicare Other | Admitting: Family Medicine

## 2021-09-20 ENCOUNTER — Encounter: Payer: Self-pay | Admitting: Family Medicine

## 2021-09-20 ENCOUNTER — Other Ambulatory Visit: Payer: Self-pay

## 2021-09-20 VITALS — BP 126/68 | Wt 145.0 lb

## 2021-09-20 DIAGNOSIS — Z789 Other specified health status: Secondary | ICD-10-CM | POA: Diagnosis not present

## 2021-09-20 DIAGNOSIS — I10 Essential (primary) hypertension: Secondary | ICD-10-CM

## 2021-09-20 DIAGNOSIS — Z7409 Other reduced mobility: Secondary | ICD-10-CM | POA: Diagnosis not present

## 2021-09-20 NOTE — Progress Notes (Addendum)
   Subjective:    Patient ID: Tammy Lloyd, female    DOB: 1923/12/02, 88 y.o.   MRN: [id number]  HPI Documentation for virtual audio and video telecommunications through [place] encounter:  The patient was located at home. 2 patient identifiers used.  The provider was located in the office. The patient did consent to this visit and is aware of possible charges through their insurance for this visit. The daughter is the one that I talked to in this telemedicine service Time spent on call was 5 minutes and in review of previous records >20 minutes total for counseling and coordination of care. This virtual service is not related to other E/M service within previous 7 days.  Tammy Lloyd is now being cared for at home by her daughter who is there by herself.  Initially the question was concerning for your lip.  She did have PT and OT come out to assess need.  The lift was delivered yesterday and she is use it once.  Tammy Lloyd is also having some incontinence issues.  Her daughter does use a diaper but she leaks through it.  Review of Systems     Objective:   Physical Exam Patient not evaluated.       Assessment & Plan:  Impaired mobility and ADLs The Hoyer lift seems to be helping.  Encouraged her to use 2 diapers as well as vinyl sheeting for the bed to keep the urine from leaking into the mattress pad.  Also she is to discuss getting a helper so the she can get out and do her own ADLs and not feel so trapped.  Home health will be involved in her care to help with her ADLs, lower extremity edema and weakness.  She also has a history of hypertension which they will ensure that she is being cared for properly

## 2021-09-21 ENCOUNTER — Telehealth: Payer: Self-pay

## 2021-09-21 DIAGNOSIS — I2510 Atherosclerotic heart disease of native coronary artery without angina pectoris: Secondary | ICD-10-CM | POA: Diagnosis not present

## 2021-09-21 DIAGNOSIS — I10 Essential (primary) hypertension: Secondary | ICD-10-CM | POA: Diagnosis not present

## 2021-09-21 DIAGNOSIS — H3530 Unspecified macular degeneration: Secondary | ICD-10-CM | POA: Diagnosis not present

## 2021-09-21 DIAGNOSIS — Z7409 Other reduced mobility: Secondary | ICD-10-CM | POA: Diagnosis not present

## 2021-09-21 DIAGNOSIS — M1380 Other specified arthritis, unspecified site: Secondary | ICD-10-CM | POA: Diagnosis not present

## 2021-09-21 DIAGNOSIS — I4891 Unspecified atrial fibrillation: Secondary | ICD-10-CM | POA: Diagnosis not present

## 2021-09-21 NOTE — Telephone Encounter (Signed)
Called Tammy to advise that pt has had her face to face with Dr. Redmond School. [place] ?

## 2021-09-22 DIAGNOSIS — I2510 Atherosclerotic heart disease of native coronary artery without angina pectoris: Secondary | ICD-10-CM | POA: Diagnosis not present

## 2021-09-22 DIAGNOSIS — M1380 Other specified arthritis, unspecified site: Secondary | ICD-10-CM | POA: Diagnosis not present

## 2021-09-22 DIAGNOSIS — H3530 Unspecified macular degeneration: Secondary | ICD-10-CM | POA: Diagnosis not present

## 2021-09-22 DIAGNOSIS — Z7409 Other reduced mobility: Secondary | ICD-10-CM | POA: Diagnosis not present

## 2021-09-22 DIAGNOSIS — I4891 Unspecified atrial fibrillation: Secondary | ICD-10-CM | POA: Diagnosis not present

## 2021-09-22 DIAGNOSIS — I10 Essential (primary) hypertension: Secondary | ICD-10-CM | POA: Diagnosis not present

## 2021-09-26 ENCOUNTER — Telehealth: Payer: Self-pay

## 2021-09-26 DIAGNOSIS — Z7409 Other reduced mobility: Secondary | ICD-10-CM | POA: Diagnosis not present

## 2021-09-26 DIAGNOSIS — I2510 Atherosclerotic heart disease of native coronary artery without angina pectoris: Secondary | ICD-10-CM | POA: Diagnosis not present

## 2021-09-26 DIAGNOSIS — H3530 Unspecified macular degeneration: Secondary | ICD-10-CM | POA: Diagnosis not present

## 2021-09-26 DIAGNOSIS — I4891 Unspecified atrial fibrillation: Secondary | ICD-10-CM | POA: Diagnosis not present

## 2021-09-26 DIAGNOSIS — M1380 Other specified arthritis, unspecified site: Secondary | ICD-10-CM | POA: Diagnosis not present

## 2021-09-26 DIAGNOSIS — I10 Essential (primary) hypertension: Secondary | ICD-10-CM | POA: Diagnosis not present

## 2021-09-26 NOTE — Telephone Encounter (Signed)
[place] advised. [place] ?

## 2021-09-26 NOTE — Telephone Encounter (Signed)
Tammy Lloyd is requesting a verbal for pt to have a medial social worker eval. Please advise if this is ok. It is just for community resources.  ? ?Elyse Jarvis RMA' ?

## 2021-09-27 DIAGNOSIS — H3530 Unspecified macular degeneration: Secondary | ICD-10-CM | POA: Diagnosis not present

## 2021-09-27 DIAGNOSIS — Z7409 Other reduced mobility: Secondary | ICD-10-CM | POA: Diagnosis not present

## 2021-09-27 DIAGNOSIS — I4891 Unspecified atrial fibrillation: Secondary | ICD-10-CM | POA: Diagnosis not present

## 2021-09-27 DIAGNOSIS — M1380 Other specified arthritis, unspecified site: Secondary | ICD-10-CM | POA: Diagnosis not present

## 2021-09-27 DIAGNOSIS — I10 Essential (primary) hypertension: Secondary | ICD-10-CM | POA: Diagnosis not present

## 2021-09-27 DIAGNOSIS — I2510 Atherosclerotic heart disease of native coronary artery without angina pectoris: Secondary | ICD-10-CM | POA: Diagnosis not present

## 2021-09-28 DIAGNOSIS — Z7409 Other reduced mobility: Secondary | ICD-10-CM | POA: Diagnosis not present

## 2021-09-28 DIAGNOSIS — M1380 Other specified arthritis, unspecified site: Secondary | ICD-10-CM | POA: Diagnosis not present

## 2021-09-28 DIAGNOSIS — I2510 Atherosclerotic heart disease of native coronary artery without angina pectoris: Secondary | ICD-10-CM | POA: Diagnosis not present

## 2021-09-28 DIAGNOSIS — H3530 Unspecified macular degeneration: Secondary | ICD-10-CM | POA: Diagnosis not present

## 2021-09-28 DIAGNOSIS — I4891 Unspecified atrial fibrillation: Secondary | ICD-10-CM | POA: Diagnosis not present

## 2021-09-28 DIAGNOSIS — I10 Essential (primary) hypertension: Secondary | ICD-10-CM | POA: Diagnosis not present

## 2021-09-30 ENCOUNTER — Telehealth: Payer: Self-pay

## 2021-09-30 NOTE — Telephone Encounter (Signed)
Pt. Daughter called stating that she wanted to know if you could order some palliative care for Tammy Lloyd. She is having a really hard time getting her in and out of bed in the morning. They have [facility] but they are now needing palliative care if you could put in an order for that.  ?

## 2021-10-03 ENCOUNTER — Other Ambulatory Visit: Payer: Self-pay

## 2021-10-03 ENCOUNTER — Telehealth: Payer: Self-pay

## 2021-10-03 DIAGNOSIS — I4891 Unspecified atrial fibrillation: Secondary | ICD-10-CM | POA: Diagnosis not present

## 2021-10-03 DIAGNOSIS — Z7409 Other reduced mobility: Secondary | ICD-10-CM | POA: Diagnosis not present

## 2021-10-03 DIAGNOSIS — I10 Essential (primary) hypertension: Secondary | ICD-10-CM | POA: Diagnosis not present

## 2021-10-03 DIAGNOSIS — I2510 Atherosclerotic heart disease of native coronary artery without angina pectoris: Secondary | ICD-10-CM | POA: Diagnosis not present

## 2021-10-03 DIAGNOSIS — H3530 Unspecified macular degeneration: Secondary | ICD-10-CM | POA: Diagnosis not present

## 2021-10-03 DIAGNOSIS — R627 Adult failure to thrive: Secondary | ICD-10-CM

## 2021-10-03 DIAGNOSIS — M1380 Other specified arthritis, unspecified site: Secondary | ICD-10-CM | POA: Diagnosis not present

## 2021-10-03 NOTE — Telephone Encounter (Signed)
Spoke with patient's daughter Edmonia Lynch and scheduled a mychart Palliative Consult for 10/10/21 @ 2:30 PM.  ? ?Consent obtained; updated Outlook/Netsmart/Team List and Epic.  ? ?

## 2021-10-04 ENCOUNTER — Telehealth: Payer: Self-pay

## 2021-10-04 DIAGNOSIS — M1380 Other specified arthritis, unspecified site: Secondary | ICD-10-CM | POA: Diagnosis not present

## 2021-10-04 DIAGNOSIS — I10 Essential (primary) hypertension: Secondary | ICD-10-CM | POA: Diagnosis not present

## 2021-10-04 DIAGNOSIS — I4891 Unspecified atrial fibrillation: Secondary | ICD-10-CM | POA: Diagnosis not present

## 2021-10-04 DIAGNOSIS — Z7409 Other reduced mobility: Secondary | ICD-10-CM | POA: Diagnosis not present

## 2021-10-04 DIAGNOSIS — H3530 Unspecified macular degeneration: Secondary | ICD-10-CM | POA: Diagnosis not present

## 2021-10-04 DIAGNOSIS — I2510 Atherosclerotic heart disease of native coronary artery without angina pectoris: Secondary | ICD-10-CM | POA: Diagnosis not present

## 2021-10-04 NOTE — Telephone Encounter (Signed)
Nurse from [facility] called to advise pt fell about two weeks ago and her injured her right knee. She reports that it is still swollen and red down her her medial leg and now it is warm to the touch with pain. Please advise [place] ?

## 2021-10-05 DIAGNOSIS — H3530 Unspecified macular degeneration: Secondary | ICD-10-CM | POA: Diagnosis not present

## 2021-10-05 DIAGNOSIS — I10 Essential (primary) hypertension: Secondary | ICD-10-CM | POA: Diagnosis not present

## 2021-10-05 DIAGNOSIS — M1380 Other specified arthritis, unspecified site: Secondary | ICD-10-CM | POA: Diagnosis not present

## 2021-10-05 DIAGNOSIS — I4891 Unspecified atrial fibrillation: Secondary | ICD-10-CM | POA: Diagnosis not present

## 2021-10-05 DIAGNOSIS — I2510 Atherosclerotic heart disease of native coronary artery without angina pectoris: Secondary | ICD-10-CM | POA: Diagnosis not present

## 2021-10-05 DIAGNOSIS — Z7409 Other reduced mobility: Secondary | ICD-10-CM | POA: Diagnosis not present

## 2021-10-05 NOTE — Telephone Encounter (Signed)
Pt is unable to get out the house . Can this virtual? ?

## 2021-10-06 NOTE — Telephone Encounter (Signed)
Done KH 

## 2021-10-10 ENCOUNTER — Telehealth: Payer: Medicare Other | Admitting: Family Medicine

## 2021-10-10 ENCOUNTER — Telehealth: Payer: Self-pay | Admitting: Family Medicine

## 2021-10-10 DIAGNOSIS — M1380 Other specified arthritis, unspecified site: Secondary | ICD-10-CM | POA: Diagnosis not present

## 2021-10-10 DIAGNOSIS — I2510 Atherosclerotic heart disease of native coronary artery without angina pectoris: Secondary | ICD-10-CM | POA: Diagnosis not present

## 2021-10-10 DIAGNOSIS — Z7409 Other reduced mobility: Secondary | ICD-10-CM | POA: Diagnosis not present

## 2021-10-10 DIAGNOSIS — I4891 Unspecified atrial fibrillation: Secondary | ICD-10-CM | POA: Diagnosis not present

## 2021-10-10 DIAGNOSIS — I10 Essential (primary) hypertension: Secondary | ICD-10-CM | POA: Diagnosis not present

## 2021-10-10 DIAGNOSIS — H3530 Unspecified macular degeneration: Secondary | ICD-10-CM | POA: Diagnosis not present

## 2021-10-10 NOTE — Telephone Encounter (Signed)
TCT pt's daughter Edmonia Lynch who states she is in the grocery store in [place] and thought pt's visit was tomorrow.  Rescheduled my chart video visit for 10/11/21 at 3:30 pm.  Damaris Hippo FNP-C ?

## 2021-10-11 ENCOUNTER — Encounter: Payer: Self-pay | Admitting: Family Medicine

## 2021-10-11 ENCOUNTER — Telehealth (INDEPENDENT_AMBULATORY_CARE_PROVIDER_SITE_OTHER): Payer: Medicare Other | Admitting: Family Medicine

## 2021-10-11 ENCOUNTER — Telehealth: Payer: Self-pay | Admitting: Family Medicine

## 2021-10-11 VITALS — Temp 97.0°F | Wt 145.0 lb

## 2021-10-11 DIAGNOSIS — H3530 Unspecified macular degeneration: Secondary | ICD-10-CM | POA: Diagnosis not present

## 2021-10-11 DIAGNOSIS — M25469 Effusion, unspecified knee: Secondary | ICD-10-CM

## 2021-10-11 DIAGNOSIS — I2510 Atherosclerotic heart disease of native coronary artery without angina pectoris: Secondary | ICD-10-CM | POA: Diagnosis not present

## 2021-10-11 DIAGNOSIS — I4891 Unspecified atrial fibrillation: Secondary | ICD-10-CM | POA: Diagnosis not present

## 2021-10-11 DIAGNOSIS — Z7409 Other reduced mobility: Secondary | ICD-10-CM | POA: Diagnosis not present

## 2021-10-11 DIAGNOSIS — R262 Difficulty in walking, not elsewhere classified: Secondary | ICD-10-CM

## 2021-10-11 DIAGNOSIS — M1380 Other specified arthritis, unspecified site: Secondary | ICD-10-CM | POA: Diagnosis not present

## 2021-10-11 DIAGNOSIS — S8001XA Contusion of right knee, initial encounter: Secondary | ICD-10-CM

## 2021-10-11 DIAGNOSIS — I10 Essential (primary) hypertension: Secondary | ICD-10-CM | POA: Diagnosis not present

## 2021-10-11 DIAGNOSIS — Z515 Encounter for palliative care: Secondary | ICD-10-CM | POA: Insufficient documentation

## 2021-10-11 NOTE — Progress Notes (Signed)
? ?  Subjective:  ? ? Patient ID: Tammy Lloyd, female    DOB: Oct 26, 1923, 88 y.o.   MRN: [id number] ? ?HPI ?Documentation for virtual audio and video telecommunications through [place] encounter: ?The patient was located at home. 2 patient identifiers used.  ?The provider was located in the office. ?The patient did consent to this visit and is aware of possible charges through their insurance for this visit. ?The other persons participating in this telemedicine service was her daughter. ?Time spent on call was 5 minutes and in review of previous records >10 minutes total for counseling and coordination of care. ?This virtual service is not related to other E/M service within previous 7 days.  ?She apparently fell 2 weeks ago sustaining an injury to her right knee.  Since then she has done fairly well but her daughter did note that it became quite red but it was not hot red or tender.  She states that in the last day or so it is actually little bit better. ? ?Review of Systems ? ?   ?Objective:  ? Physical Exam ?I was able to visually look with the camera at her knee and it did not appear red and her daughter said that it was not tender or warm. ? ? ? ?   ?Assessment & Plan:  ?Contusion of right knee, initial encounter ?I explained that since it is not hot red and tender I doubt that it was truly an infection.  Recommend heat for 20 minutes couple times per day and visually inspecting it regularly.  If there is any questions she will call me back.  She and her daughter were comfortable with that. ? ?

## 2021-10-11 NOTE — Progress Notes (Signed)
? ? ?Manufacturing engineer ?Community Palliative Care Consult Note ?Telephone: [phone]  ?Fax: [phone]  ? ?Date of encounter: 10/11/21 ?3:30 PM ?PATIENT NAME: Tammy Lloyd ?[place] [address]   ?[phone] (home)  ?DOB: 09/30/1923 ?MRN: [id number] ?PRIMARY CARE PROVIDER:    ?Denita Lung, MD,  ?[address] ?[place] [address] ?[phone] ? ?REFERRING PROVIDER:   ?Denita Lung, MD ?[address] ?[place],  [place] [address] ?[phone] ? ?RESPONSIBLE PARTY:    ?Contact Information   ? ? Name Relation Home Work Mobile  ? Sharen Counter Daughter [phone]  ? ?  ? ? ? ?I connected with  TEZRA MAHR 's daughter Edmonia Lynch on 10/11/21 by a video enabled telemedicine application and verified that I am speaking with the correct person using two identifiers however due to limitations with poor video and lack of audio, visit was converted to telephonic to aid communication with caregiver. ?  ?I discussed the limitations of evaluation and management by telemedicine. The patient expressed understanding and agreed to proceed.  ? Palliative Care was asked to follow this patient by consultation request of  Denita Lung, MD to address advance care planning and complex medical decision making. This is the initial visit.  ? ? ?      ASSESSMENT, SYMPTOM MANAGEMENT AND PLAN / RECOMMENDATIONS:  ? Palliative Care Encounter ?SW referral for possible in home respite care for caregiver. ?Need to address goals of care and obtain copy of HC POA. ? ? Impaired ambulation/fall ?No falls since last one 2 weeks ago.   ?Bedbound. ?If has fall needs to follow up immediately due to anticoagulation and meningioma. ? ? Knee swelling ?Agree with 2 times daily heat application for 76-72 minutes. ?Continue elevation. ?Recommend scheduled Tylenol 650 mg QID while awake for pain. ?Notify PCP for increased erythema, heat or edema/pain. ? ?Follow up Palliative Care Visit:  Palliative care will continue to follow for complex medical decision making, advance care planning, and clarification of goals. Return 2 weeks or prn. ? ? ? ?This visit was coded based on medical decision making (MDM). ? ?PPS: 30% ? ?HOSPICE ELIGIBILITY/DIAGNOSIS: TBD ? ?Chief Complaint:  ?[facility] received a referral to follow up with patient for chronic disease management in patient with severe arthritis of bilateral knees, hx of stroke and breast cancer as well as pulmonary hypertension. Palliative Care is also following to assist with advance directives and defining/refining goals of care. ? ? ?HISTORY OF PRESENT ILLNESS:  Tammy Lloyd is a 88 y.o. year old female with pulmonary hypertension, idopathic hemochromatosis, hx of breast cancer, HTN, meningioma, paroxysmal atrial fibrillation on anticoagulation with hx of stroke,HLD and bilateral OA of knees.  Per daughter pt is HOH, with minimal vision and bedbound, incontinent of bowel and bladder because she cannot get up independently to get to bedside commode.  Daughter reports significant pain in bilateral knees with recent fall/bruising of knees with continued pain and swelling of right knee.  She had a video visit with her PCP today and was told to apply heat to knee BID x 15 minutes. Daughter states she continues to eat well every day, has had PT and OT through [facility] but no improvement to ambulatory status.  She states pt is bone on bone arthritis in her left knee, has had hx of right knee replacement but this has been swollen and painful.  Denies pt c/o CP, SOB.  Daughter is wanting to find some in-home  help for a couple of hours a couple days per week so she can get out to run errands or have her own medical appointments.  States pt mood is good.  ? ?History obtained from review of EMR, discussion with  family, and/or Ms. Sebesta.  ?I reviewed available labs, medications, imaging, studies and related documents from the EMR.   Records reviewed and summarized above.  ? ?ROS ?Pt HOH and with visual deficits.  Video only worked for a couple of minutes at beginning but had no sound.  ?Pt requires total assistance for bathing and dressing. ? ?Physical Exam: ?Current and past weights: 145 lbs as of 10/10/21. ?Unable to visualize pt due to technical video problems with poor quality and no sound. ? ?CURRENT PROBLEM LIST:  ?Patient Active Problem List  ? Diagnosis Date Noted  ? Age-related cataract 04/04/2021  ? Encounter for long-term (current) use of non-steroidal anti-inflammatories 04/02/2020  ? Chronic pain of both knees 04/02/2020  ? Knee swelling 04/02/2020  ? Decreased range of motion of both knees 04/02/2020  ? Fall 04/02/2020  ? Edema 04/02/2020  ? Decreased activities of daily living (ADL) 04/02/2020  ? Impaired ambulation 04/02/2020  ? Frequent nosebleeds 04/02/2020  ? Fatigue 04/02/2020  ? Left epiretinal membrane 01/15/2020  ? Intermediate stage nonexudative age-related macular degeneration of right eye 01/15/2020  ? Advanced nonexudative age-related macular degeneration of left eye without subfoveal involvement 01/15/2020  ? Macular degeneration 02/25/2019  ? Presbycusis of both ears 12/28/2016  ? History of CVA (cerebrovascular accident) 07/20/2016  ? Abnormality of gait 10/06/2013  ? Meningioma ([place]) 10/05/2013  ? Atrial fibrillation ([place]) 10/05/2013  ? CAD (coronary artery disease) 10/23/2011  ? Idiopathic hemochromatosis 09/28/2011  ? Hypertension 12/19/2010  ? Arthritis 12/19/2010  ? History of breast cancer 12/19/2010  ? ?PAST MEDICAL HISTORY:  ?Active Ambulatory Problems  ?  Diagnosis Date Noted  ? Hypertension 12/19/2010  ? Arthritis 12/19/2010  ? History of breast cancer 12/19/2010  ? Idiopathic hemochromatosis 09/28/2011  ? CAD (coronary artery disease) 10/23/2011  ? Meningioma ([place]) 10/05/2013  ? Atrial fibrillation ([place]) 10/05/2013  ? Abnormality of gait 10/06/2013  ? History of CVA (cerebrovascular accident) 07/20/2016  ?  Presbycusis of both ears 12/28/2016  ? Macular degeneration 02/25/2019  ? Left epiretinal membrane 01/15/2020  ? Intermediate stage nonexudative age-related macular degeneration of right eye 01/15/2020  ? Advanced nonexudative age-related macular degeneration of left eye without subfoveal involvement 01/15/2020  ? Encounter for long-term (current) use of non-steroidal anti-inflammatories 04/02/2020  ? Chronic pain of both knees 04/02/2020  ? Knee swelling 04/02/2020  ? Decreased range of motion of both knees 04/02/2020  ? Fall 04/02/2020  ? Edema 04/02/2020  ? Decreased activities of daily living (ADL) 04/02/2020  ? Impaired ambulation 04/02/2020  ? Frequent nosebleeds 04/02/2020  ? Fatigue 04/02/2020  ? Age-related cataract 04/04/2021  ? ?Resolved Ambulatory Problems  ?  Diagnosis Date Noted  ? Osteoporosis 12/19/2010  ? GERD (gastroesophageal reflux disease) 09/06/2011  ? Acute CVA (cerebrovascular accident) ([place]) 10/05/2013  ? Numbness of left hand 10/05/2013  ? ?Past Medical History:  ?Diagnosis Date  ? Cancer [facility])   ? Cataract   ? Colon polyp   ? Edema of both legs   ? Hemochromatosis   ? Hemorrhoid   ? Lichen sclerosus   ? Osteoporosis 2005  ? Persistent atrial fibrillation ([place])   ? Pulmonary hypertension ([place])   ? Stroke [facility])   ? ?SOCIAL HX:  ?Social History  ? ?Tobacco  Use  ? Smoking status: Never  ? Smokeless tobacco: Never  ?Substance Use Topics  ? Alcohol use: No  ?  Alcohol/week: 0.0 standard drinks  ? ?FAMILY HX:  ?Family History  ?Problem Relation Age of Onset  ? Cancer Mother 63  ?     GYN cancer (had hysterectomy)  ? Heart disease Mother   ?     CHF  ? Cancer Father   ?     liver  ? Asthma Sister   ? Breast cancer Sister 65  ? Heart disease Brother   ?     rheumatic heart attack  ? Breast cancer Sister 1  ? Asthma Brother   ? Cancer Brother   ?     Prostate  ? Cancer Brother   ?     Prostate  ?   ? ? ?Preferred Pharmacy: ?ALLERGIES:  ?Allergies  ?Allergen Reactions  ? Tape Other (See Comments)  ?   Skin is thin and may tear or bruise easily  ?   ?PERTINENT MEDICATIONS:  ?Outpatient Encounter Medications as of 10/11/2021  ?Medication Sig  ? acetaminophen (TYLENOL) 325 MG tablet Take 650 mg by mouth

## 2021-10-13 ENCOUNTER — Telehealth: Payer: Self-pay

## 2021-10-13 DIAGNOSIS — Z7409 Other reduced mobility: Secondary | ICD-10-CM | POA: Diagnosis not present

## 2021-10-13 DIAGNOSIS — I10 Essential (primary) hypertension: Secondary | ICD-10-CM | POA: Diagnosis not present

## 2021-10-13 DIAGNOSIS — I4891 Unspecified atrial fibrillation: Secondary | ICD-10-CM | POA: Diagnosis not present

## 2021-10-13 DIAGNOSIS — I2510 Atherosclerotic heart disease of native coronary artery without angina pectoris: Secondary | ICD-10-CM | POA: Diagnosis not present

## 2021-10-13 DIAGNOSIS — E785 Hyperlipidemia, unspecified: Secondary | ICD-10-CM | POA: Diagnosis not present

## 2021-10-13 DIAGNOSIS — H3530 Unspecified macular degeneration: Secondary | ICD-10-CM | POA: Diagnosis not present

## 2021-10-13 DIAGNOSIS — M1380 Other specified arthritis, unspecified site: Secondary | ICD-10-CM | POA: Diagnosis not present

## 2021-10-13 DIAGNOSIS — Z7901 Long term (current) use of anticoagulants: Secondary | ICD-10-CM | POA: Diagnosis not present

## 2021-10-13 NOTE — Telephone Encounter (Signed)
PC SW outreached patients daughter, Edmonia Lynch, per [facility] NP - K. [place] SW referral request, to address needs of in home care support. ? ? ?Call unsuccessful. SW unable to LVM due to mailbox being full.  ?

## 2021-10-17 ENCOUNTER — Telehealth: Payer: Self-pay

## 2021-10-17 DIAGNOSIS — I2510 Atherosclerotic heart disease of native coronary artery without angina pectoris: Secondary | ICD-10-CM | POA: Diagnosis not present

## 2021-10-17 DIAGNOSIS — Z7409 Other reduced mobility: Secondary | ICD-10-CM | POA: Diagnosis not present

## 2021-10-17 DIAGNOSIS — I10 Essential (primary) hypertension: Secondary | ICD-10-CM | POA: Diagnosis not present

## 2021-10-17 DIAGNOSIS — M1380 Other specified arthritis, unspecified site: Secondary | ICD-10-CM | POA: Diagnosis not present

## 2021-10-17 DIAGNOSIS — H3530 Unspecified macular degeneration: Secondary | ICD-10-CM | POA: Diagnosis not present

## 2021-10-17 DIAGNOSIS — I4891 Unspecified atrial fibrillation: Secondary | ICD-10-CM | POA: Diagnosis not present

## 2021-10-17 NOTE — Telephone Encounter (Signed)
Tammy Lloyd from [facility] called and stated pt fell this morning. Her daughter was helping her get into her wheelchair and her knees buckled. Tammy Lloyd said it was not a hard fall and she had no injuries just wanted to report it to her PCP. ?

## 2021-10-20 DIAGNOSIS — I2510 Atherosclerotic heart disease of native coronary artery without angina pectoris: Secondary | ICD-10-CM | POA: Diagnosis not present

## 2021-10-20 DIAGNOSIS — M1380 Other specified arthritis, unspecified site: Secondary | ICD-10-CM | POA: Diagnosis not present

## 2021-10-20 DIAGNOSIS — H3530 Unspecified macular degeneration: Secondary | ICD-10-CM | POA: Diagnosis not present

## 2021-10-20 DIAGNOSIS — Z7409 Other reduced mobility: Secondary | ICD-10-CM | POA: Diagnosis not present

## 2021-10-20 DIAGNOSIS — I4891 Unspecified atrial fibrillation: Secondary | ICD-10-CM | POA: Diagnosis not present

## 2021-10-20 DIAGNOSIS — I10 Essential (primary) hypertension: Secondary | ICD-10-CM | POA: Diagnosis not present

## 2021-10-26 DIAGNOSIS — H3530 Unspecified macular degeneration: Secondary | ICD-10-CM | POA: Diagnosis not present

## 2021-10-26 DIAGNOSIS — M1380 Other specified arthritis, unspecified site: Secondary | ICD-10-CM | POA: Diagnosis not present

## 2021-10-26 DIAGNOSIS — I10 Essential (primary) hypertension: Secondary | ICD-10-CM | POA: Diagnosis not present

## 2021-10-26 DIAGNOSIS — Z7409 Other reduced mobility: Secondary | ICD-10-CM | POA: Diagnosis not present

## 2021-10-26 DIAGNOSIS — I2510 Atherosclerotic heart disease of native coronary artery without angina pectoris: Secondary | ICD-10-CM | POA: Diagnosis not present

## 2021-10-26 DIAGNOSIS — I4891 Unspecified atrial fibrillation: Secondary | ICD-10-CM | POA: Diagnosis not present

## 2021-10-27 DIAGNOSIS — Z7409 Other reduced mobility: Secondary | ICD-10-CM | POA: Diagnosis not present

## 2021-10-27 DIAGNOSIS — I4891 Unspecified atrial fibrillation: Secondary | ICD-10-CM | POA: Diagnosis not present

## 2021-10-27 DIAGNOSIS — I2510 Atherosclerotic heart disease of native coronary artery without angina pectoris: Secondary | ICD-10-CM | POA: Diagnosis not present

## 2021-10-27 DIAGNOSIS — I10 Essential (primary) hypertension: Secondary | ICD-10-CM | POA: Diagnosis not present

## 2021-10-27 DIAGNOSIS — M1380 Other specified arthritis, unspecified site: Secondary | ICD-10-CM | POA: Diagnosis not present

## 2021-10-27 DIAGNOSIS — H3530 Unspecified macular degeneration: Secondary | ICD-10-CM | POA: Diagnosis not present

## 2021-11-02 DIAGNOSIS — H3530 Unspecified macular degeneration: Secondary | ICD-10-CM | POA: Diagnosis not present

## 2021-11-02 DIAGNOSIS — I10 Essential (primary) hypertension: Secondary | ICD-10-CM | POA: Diagnosis not present

## 2021-11-02 DIAGNOSIS — I2510 Atherosclerotic heart disease of native coronary artery without angina pectoris: Secondary | ICD-10-CM | POA: Diagnosis not present

## 2021-11-02 DIAGNOSIS — Z7409 Other reduced mobility: Secondary | ICD-10-CM | POA: Diagnosis not present

## 2021-11-02 DIAGNOSIS — M1380 Other specified arthritis, unspecified site: Secondary | ICD-10-CM | POA: Diagnosis not present

## 2021-11-02 DIAGNOSIS — I4891 Unspecified atrial fibrillation: Secondary | ICD-10-CM | POA: Diagnosis not present

## 2021-11-04 DIAGNOSIS — H3530 Unspecified macular degeneration: Secondary | ICD-10-CM | POA: Diagnosis not present

## 2021-11-04 DIAGNOSIS — I2510 Atherosclerotic heart disease of native coronary artery without angina pectoris: Secondary | ICD-10-CM | POA: Diagnosis not present

## 2021-11-04 DIAGNOSIS — I4891 Unspecified atrial fibrillation: Secondary | ICD-10-CM | POA: Diagnosis not present

## 2021-11-04 DIAGNOSIS — Z7409 Other reduced mobility: Secondary | ICD-10-CM | POA: Diagnosis not present

## 2021-11-04 DIAGNOSIS — M1380 Other specified arthritis, unspecified site: Secondary | ICD-10-CM | POA: Diagnosis not present

## 2021-11-04 DIAGNOSIS — I10 Essential (primary) hypertension: Secondary | ICD-10-CM | POA: Diagnosis not present

## 2021-11-08 ENCOUNTER — Telehealth: Payer: Self-pay

## 2021-11-08 DIAGNOSIS — Z7409 Other reduced mobility: Secondary | ICD-10-CM | POA: Diagnosis not present

## 2021-11-08 DIAGNOSIS — I2510 Atherosclerotic heart disease of native coronary artery without angina pectoris: Secondary | ICD-10-CM | POA: Diagnosis not present

## 2021-11-08 DIAGNOSIS — I10 Essential (primary) hypertension: Secondary | ICD-10-CM | POA: Diagnosis not present

## 2021-11-08 DIAGNOSIS — I4891 Unspecified atrial fibrillation: Secondary | ICD-10-CM | POA: Diagnosis not present

## 2021-11-08 DIAGNOSIS — M1380 Other specified arthritis, unspecified site: Secondary | ICD-10-CM | POA: Diagnosis not present

## 2021-11-08 DIAGNOSIS — H3530 Unspecified macular degeneration: Secondary | ICD-10-CM | POA: Diagnosis not present

## 2021-11-08 NOTE — Telephone Encounter (Signed)
Fully electric bed and half side rail is being requesting for pt from [place] with [facility]  if approves send to [facility]. Please advise [place] ?

## 2021-11-08 NOTE — Telephone Encounter (Signed)
Tammy Lloyd is also requesting further pt for one week for four weeks . Please advise [place] ?

## 2021-11-10 ENCOUNTER — Other Ambulatory Visit: Payer: Self-pay | Admitting: Family Medicine

## 2021-11-11 ENCOUNTER — Other Ambulatory Visit: Payer: Self-pay

## 2021-11-11 DIAGNOSIS — Z789 Other specified health status: Secondary | ICD-10-CM

## 2021-11-11 DIAGNOSIS — R627 Adult failure to thrive: Secondary | ICD-10-CM

## 2021-11-11 NOTE — Telephone Encounter (Signed)
Order placed and almyra was advised of verbal. [place] ?

## 2021-11-12 DIAGNOSIS — M1380 Other specified arthritis, unspecified site: Secondary | ICD-10-CM | POA: Diagnosis not present

## 2021-11-12 DIAGNOSIS — H3530 Unspecified macular degeneration: Secondary | ICD-10-CM | POA: Diagnosis not present

## 2021-11-12 DIAGNOSIS — Z7901 Long term (current) use of anticoagulants: Secondary | ICD-10-CM | POA: Diagnosis not present

## 2021-11-12 DIAGNOSIS — E785 Hyperlipidemia, unspecified: Secondary | ICD-10-CM | POA: Diagnosis not present

## 2021-11-12 DIAGNOSIS — I2510 Atherosclerotic heart disease of native coronary artery without angina pectoris: Secondary | ICD-10-CM | POA: Diagnosis not present

## 2021-11-12 DIAGNOSIS — Z7409 Other reduced mobility: Secondary | ICD-10-CM | POA: Diagnosis not present

## 2021-11-12 DIAGNOSIS — I10 Essential (primary) hypertension: Secondary | ICD-10-CM | POA: Diagnosis not present

## 2021-11-12 DIAGNOSIS — I4891 Unspecified atrial fibrillation: Secondary | ICD-10-CM | POA: Diagnosis not present

## 2021-11-16 DIAGNOSIS — I4891 Unspecified atrial fibrillation: Secondary | ICD-10-CM | POA: Diagnosis not present

## 2021-11-16 DIAGNOSIS — I10 Essential (primary) hypertension: Secondary | ICD-10-CM | POA: Diagnosis not present

## 2021-11-16 DIAGNOSIS — M1380 Other specified arthritis, unspecified site: Secondary | ICD-10-CM | POA: Diagnosis not present

## 2021-11-16 DIAGNOSIS — H3530 Unspecified macular degeneration: Secondary | ICD-10-CM | POA: Diagnosis not present

## 2021-11-16 DIAGNOSIS — Z7409 Other reduced mobility: Secondary | ICD-10-CM | POA: Diagnosis not present

## 2021-11-16 DIAGNOSIS — I2510 Atherosclerotic heart disease of native coronary artery without angina pectoris: Secondary | ICD-10-CM | POA: Diagnosis not present

## 2021-11-22 DIAGNOSIS — I10 Essential (primary) hypertension: Secondary | ICD-10-CM | POA: Diagnosis not present

## 2021-11-22 DIAGNOSIS — Z7409 Other reduced mobility: Secondary | ICD-10-CM | POA: Diagnosis not present

## 2021-11-22 DIAGNOSIS — H3530 Unspecified macular degeneration: Secondary | ICD-10-CM | POA: Diagnosis not present

## 2021-11-22 DIAGNOSIS — I2510 Atherosclerotic heart disease of native coronary artery without angina pectoris: Secondary | ICD-10-CM | POA: Diagnosis not present

## 2021-11-22 DIAGNOSIS — I4891 Unspecified atrial fibrillation: Secondary | ICD-10-CM | POA: Diagnosis not present

## 2021-11-22 DIAGNOSIS — M1380 Other specified arthritis, unspecified site: Secondary | ICD-10-CM | POA: Diagnosis not present

## 2021-11-30 ENCOUNTER — Telehealth: Payer: Self-pay

## 2021-11-30 NOTE — Telephone Encounter (Signed)
Home health advised that pt last notes needs to added to. It needs to state that pt is in need for home health for weakness and swelling due to hypertension.

## 2021-11-30 NOTE — Telephone Encounter (Signed)
Done KH 

## 2021-11-30 NOTE — Telephone Encounter (Signed)
Home health advised that pt last notes needs to added to. It needs to state that pt is in need for home health for weakness and swelling due to hypertension. the note was 09/20/21 or around that date. Please advise when done . [place]

## 2021-12-01 ENCOUNTER — Other Ambulatory Visit: Payer: Self-pay | Admitting: Family Medicine

## 2021-12-01 DIAGNOSIS — I4891 Unspecified atrial fibrillation: Secondary | ICD-10-CM | POA: Diagnosis not present

## 2021-12-01 DIAGNOSIS — I2510 Atherosclerotic heart disease of native coronary artery without angina pectoris: Secondary | ICD-10-CM | POA: Diagnosis not present

## 2021-12-01 DIAGNOSIS — Z8673 Personal history of transient ischemic attack (TIA), and cerebral infarction without residual deficits: Secondary | ICD-10-CM

## 2021-12-01 DIAGNOSIS — Z7409 Other reduced mobility: Secondary | ICD-10-CM | POA: Diagnosis not present

## 2021-12-01 DIAGNOSIS — M1380 Other specified arthritis, unspecified site: Secondary | ICD-10-CM | POA: Diagnosis not present

## 2021-12-01 DIAGNOSIS — H3530 Unspecified macular degeneration: Secondary | ICD-10-CM | POA: Diagnosis not present

## 2021-12-01 DIAGNOSIS — I10 Essential (primary) hypertension: Secondary | ICD-10-CM | POA: Diagnosis not present

## 2021-12-01 DIAGNOSIS — E785 Hyperlipidemia, unspecified: Secondary | ICD-10-CM

## 2021-12-08 ENCOUNTER — Telehealth: Payer: Self-pay

## 2021-12-08 DIAGNOSIS — M1380 Other specified arthritis, unspecified site: Secondary | ICD-10-CM | POA: Diagnosis not present

## 2021-12-08 DIAGNOSIS — H3530 Unspecified macular degeneration: Secondary | ICD-10-CM | POA: Diagnosis not present

## 2021-12-08 DIAGNOSIS — I2510 Atherosclerotic heart disease of native coronary artery without angina pectoris: Secondary | ICD-10-CM | POA: Diagnosis not present

## 2021-12-08 DIAGNOSIS — I4891 Unspecified atrial fibrillation: Secondary | ICD-10-CM | POA: Diagnosis not present

## 2021-12-08 DIAGNOSIS — I10 Essential (primary) hypertension: Secondary | ICD-10-CM | POA: Diagnosis not present

## 2021-12-08 DIAGNOSIS — Z7409 Other reduced mobility: Secondary | ICD-10-CM | POA: Diagnosis not present

## 2021-12-08 NOTE — Telephone Encounter (Signed)
[place] called stating she was checking on an order for a bed she had called about back on 11/08/21. I told her per your note you sent that order back on 11/11/21. She said the pt. Has still not received a bed and [facility] told her they never received an order for a bed for the pt. She gave a fax number for [facility] of [phone].

## 2021-12-08 NOTE — Telephone Encounter (Signed)
Sent Melissa from [facility] to find out about the hospital bed. [place]

## 2021-12-09 ENCOUNTER — Telehealth: Payer: Self-pay | Admitting: Pharmacist

## 2021-12-09 NOTE — Chronic Care Management (AMB) (Signed)
Chronic Care Management Pharmacy Assistant   Name: Tammy Lloyd  MRN: [id number] DOB: [birth year]  Reason for Encounter: Disease State   Conditions to be addressed/monitored: HTN  Recent office visits:  10/11/21 Denita Lung, MD - Patient presented via video for contusion of right knee. Recommended heat for 20 min a few times a day. No medication changes.   09/20/21 Denita Lung, MD - Patient presented via video for Impaired mobility and other concerns.No medication changes.  Recent consult visits:  10/11/21 Marijo Conception, FNP - Patient presented via video for Palliative care encounter and other concerns. No medication changes.  Hospital visits:  None in previous 6 months  Medications: Outpatient Encounter Medications as of 12/09/2021  Medication Sig   acetaminophen (TYLENOL) 325 MG tablet Take 650 mg by mouth every 6 (six) hours as needed for headache (or pain). Reported on 10/14/2015   amLODipine (NORVASC) 5 MG tablet TAKE 1 TABLET BY MOUTH  DAILY   furosemide (LASIX) 40 MG tablet Take 1 tablet (40 mg total) by mouth daily. Please schedule appointment for future refills. Thank you   hydrALAZINE (APRESOLINE) 10 MG tablet TAKE 1 TABLET BY MOUTH  TWICE DAILY   Multiple Vitamins-Minerals (PRESERVISION AREDS 2) CAPS Take 1 tablet by mouth 2 (two) times daily.   mupirocin ointment (BACTROBAN) 2 % Place 1 application into the nose 3 (three) times daily. (Patient not taking: Reported on 09/20/2021)   pravastatin (PRAVACHOL) 20 MG tablet TAKE 1 TABLET BY MOUTH AT  BEDTIME   valsartan-hydrochlorothiazide (DIOVAN-HCT) 160-12.5 MG tablet TAKE 1 TABLET BY MOUTH  DAILY   XARELTO 15 MG TABS tablet TAKE 1 TABLET BY MOUTH  DAILY WITH SUPPER   No facility-administered encounter medications on file as of 12/09/2021.   Reviewed chart prior to disease state call. Spoke with patient regarding BP  Recent Office Vitals: BP Readings from Last 3 Encounters:  09/20/21 126/68  09/01/20 (!) 130/50   04/27/20 (!) 130/50   Pulse Readings from Last 3 Encounters:  09/01/20 (!) 48  04/27/20 (!) 58  04/09/20 (!) 51    Wt Readings from Last 3 Encounters:  10/11/21 145 lb (65.8 kg)  09/20/21 145 lb (65.8 kg)  05/27/21 145 lb (65.8 kg)     Kidney Function Lab Results  Component Value Date/Time   CREATININE 1.33 (H) 09/01/2020 11:55 AM   CREATININE 1.24 (H) 04/27/2020 12:26 PM   CREATININE 1.12 (H) 12/28/2016 01:21 PM   CREATININE 1.07 (H) 08/08/2016 03:04 PM   CREATININE 1.2 (H) 06/10/2013 09:36 AM   CREATININE 1.1 01/02/2013 09:18 AM   GFRNONAA 34 (L) 09/01/2020 11:55 AM   GFRAA 39 (L) 09/01/2020 11:55 AM       Latest Ref Rng & Units 09/01/2020   11:55 AM 04/27/2020   12:26 PM 04/02/2020   12:12 PM  BMP  Glucose 65 - 99 mg/dL 102   89   92    BUN 10 - 36 mg/dL 35   33   25    Creatinine 0.57 - 1.00 mg/dL 1.33   1.24   1.16    BUN/Creat Ratio 12 - '28 26   27   22    '$ Sodium 134 - 144 mmol/L 139   138   135    Potassium 3.5 - 5.2 mmol/L 4.2   4.4   4.9    Chloride 96 - 106 mmol/L 101   100   100    CO2 20 - 29 mmol/L  $'22   24   23    'H$ Calcium 8.7 - 10.3 mg/dL 9.0   8.7   8.9      Current antihypertensive regimen:  Amlodipine 5 mg 1 tablet daily - Appropriate, Effective, Safe, Accessible Hydralazine 10 mg 1 tablet twice daily - Appropriate, Effective, Safe, Accessible Valsartan-HCTZ 160-12.5 mg 1 tablet daily - Appropriate, Effective, Safe, Accessible How often are you checking your Blood Pressure? weekly Current home BP readings: 130/70 Patients daughter reports she has been doing fine not experiencing any low readings or complaints of hypotension. What recent interventions/DTPs have been made by any provider to improve Blood Pressure control since last CPP Visit: Patients daughter reports none Any recent hospitalizations or ED visits since last visit with CPP? No  Adherence Review: Is the patient currently on ACE/ARB medication? No Does the patient have >5 day gap  between last estimated fill dates? No  Notes: Per [name] inquired if patient would like to pursue Eliquis PAP and has spent about 1500 at the pharmacy thus far to qualify. Spoke to patients daughter she reports her mom is not on Eliquis is using Xarelto. She further reports that she may not qualify as her mothers medications are a little over 300.00 for a 3 month supply every 3 months. Advised her that the recommendation was to switch from Xarelto to Eliquis per [name] with the qualifying spend amount of 1500 per year. Will not proceed in filling out application at this time as she reports that is not the case.   Care Gaps: COVID Booster - Overdue BP- 130/50 09/01/20 AWV- 11/22   Star Rating Drugs: Pravastatin 20 mg - Last filled 10/06/21 90 DS at [facility] Valsartan HCTZ - Last filled 09/09/21 90 DS at [facility] Pharmacist Assistant [phone]

## 2021-12-09 NOTE — Telephone Encounter (Signed)
Re done. Kh

## 2021-12-14 ENCOUNTER — Telehealth: Payer: Self-pay | Admitting: Family Medicine

## 2021-12-14 NOTE — Telephone Encounter (Signed)
Chasity from [facility] called and says they need her bed narrative amended into office visit notes.The number she called from is [phone]

## 2021-12-19 ENCOUNTER — Other Ambulatory Visit: Payer: Self-pay | Admitting: Family Medicine

## 2021-12-21 ENCOUNTER — Other Ambulatory Visit: Payer: Self-pay | Admitting: Family Medicine

## 2021-12-21 ENCOUNTER — Telehealth (INDEPENDENT_AMBULATORY_CARE_PROVIDER_SITE_OTHER): Payer: Medicare Other | Admitting: Family Medicine

## 2021-12-21 VITALS — BP 127/70 | Wt 145.0 lb

## 2021-12-21 DIAGNOSIS — Z7409 Other reduced mobility: Secondary | ICD-10-CM | POA: Diagnosis not present

## 2021-12-21 DIAGNOSIS — R627 Adult failure to thrive: Secondary | ICD-10-CM | POA: Diagnosis not present

## 2021-12-21 DIAGNOSIS — Z789 Other specified health status: Secondary | ICD-10-CM | POA: Diagnosis not present

## 2021-12-21 NOTE — Progress Notes (Signed)
   Subjective:    Patient ID: Tammy Lloyd, female    DOB: April 17, 1924, 88 y.o.   MRN: [id number]  HPI Documentation for virtual audio and video telecommunications through [place] encounter: The patient was located at home. 2 patient identifiers used.  The provider was located in the office. The patient did consent to this visit and is aware of possible charges through their insurance for this visit. The other persons participating in this telemedicine service was her daughter Edmonia Lynch Time spent on call was 5 minutes and in review of previous records >20 minutes total for counseling and coordination of care. This virtual service is not related to other E/M service within previous 7 days.  Today's visit is to discuss getting a hospital bed.  She has been evaluated by [facility] to help determine need for a bed and which kind of bed.  She cannot change positions on her own without help of a caregiver.  To reduce pressure she is very weak and physically unable to move around in the bed.  Because she cannot move her position ,needs to change quite frequently in order to prevent decubitus ulcers.  Electric bed will help accomplish the  Review of Systems     Objective:   Physical Exam Alert and in no distress otherwise not examined       Assessment & Plan:  Failure to thrive in adult  Impaired mobility and ADLs  Decreased activities of daily living (ADL) I think a semi-electric hospital bed is warranted under the circumstances.

## 2021-12-29 ENCOUNTER — Other Ambulatory Visit: Payer: Self-pay | Admitting: Family Medicine

## 2021-12-29 NOTE — Telephone Encounter (Signed)
Lvm for pt daughter advising that she would have to advise of a local pharmacy to have this med filled to . kh

## 2021-12-30 MED ORDER — VALSARTAN-HYDROCHLOROTHIAZIDE 160-12.5 MG PO TABS: 1.0000 | ORAL_TABLET | Freq: Every day | ORAL | 0 refills | Status: DC

## 2021-12-30 NOTE — Telephone Encounter (Signed)
Please advise if this is ok to fill due to no labs [facility]

## 2022-01-05 ENCOUNTER — Telehealth: Payer: Self-pay | Admitting: Hospice

## 2022-01-05 ENCOUNTER — Telehealth: Payer: Medicare Other | Admitting: Hospice

## 2022-01-05 DIAGNOSIS — Z515 Encounter for palliative care: Secondary | ICD-10-CM

## 2022-01-05 NOTE — Telephone Encounter (Signed)
Patient did not come up on video call as scheduled.  NP called patient/Tammy Lloyd for scheduled virtual visit.  NP left voicemail with callback number

## 2022-02-06 ENCOUNTER — Encounter (INDEPENDENT_AMBULATORY_CARE_PROVIDER_SITE_OTHER): Payer: Medicare Other | Admitting: Ophthalmology

## 2022-02-16 ENCOUNTER — Other Ambulatory Visit: Payer: Self-pay | Admitting: Family Medicine

## 2022-02-16 DIAGNOSIS — E785 Hyperlipidemia, unspecified: Secondary | ICD-10-CM

## 2022-02-16 DIAGNOSIS — I10 Essential (primary) hypertension: Secondary | ICD-10-CM

## 2022-02-16 DIAGNOSIS — Z8673 Personal history of transient ischemic attack (TIA), and cerebral infarction without residual deficits: Secondary | ICD-10-CM

## 2022-02-22 ENCOUNTER — Other Ambulatory Visit: Payer: Self-pay | Admitting: Family Medicine

## 2022-03-15 ENCOUNTER — Encounter: Payer: Self-pay | Admitting: Internal Medicine

## 2022-03-28 ENCOUNTER — Telehealth: Payer: Self-pay | Admitting: Cardiology

## 2022-03-28 MED ORDER — FUROSEMIDE 40 MG PO TABS: 40.0000 mg | ORAL_TABLET | Freq: Every day | ORAL | 0 refills | Status: DC

## 2022-03-28 NOTE — Telephone Encounter (Signed)
Daughter called to report patient is house bound and would need her follow-up visit be a tele-visit.

## 2022-03-28 NOTE — Telephone Encounter (Signed)
*  STAT* If patient is at the pharmacy, call can be transferred to refill team.   1. Which medications need to be refilled? (please list name of each medication and dose if known)   furosemide (LASIX) 40 MG tablet  2. Which pharmacy/location (including street and city if local pharmacy) is medication to be sent to?  [place], [place]  3. Do they need a 30 day or 90 day supply? 90 day   Daughter stated patient is out of this medication.

## 2022-03-28 NOTE — Telephone Encounter (Signed)
Will have Dr Marlou Porch review to determine if f/u is necessary at this point.

## 2022-03-28 NOTE — Telephone Encounter (Signed)
Pt's medication was sent to pt's pharmacy as requested asking pt to make overdue appt with Dr. Marlou Porch before anymore refills. 2nd attempt. Confirmation received.

## 2022-03-29 ENCOUNTER — Telehealth: Payer: Self-pay

## 2022-03-29 MED ORDER — FUROSEMIDE 40 MG PO TABS: 40.0000 mg | ORAL_TABLET | Freq: Every day | ORAL | 1 refills | Status: DC

## 2022-03-29 NOTE — Telephone Encounter (Signed)
Spoke with daughter and advised pt does not need to continue follow up with cardiology.  She will contact PCP for furosemide.  She will c/b if any further questions or concerns.

## 2022-03-29 NOTE — Telephone Encounter (Signed)
Pt. Daughter called to get a refill on her furosemide 40 mg. The cardiology office told her she did not need to follow up there anymore and needed to have her PCP take over her furosemide refills. Looks like they refilled it for number 15 but wanted you to take over refilling after that.

## 2022-04-18 ENCOUNTER — Encounter: Payer: Self-pay | Admitting: Internal Medicine

## 2022-05-01 ENCOUNTER — Encounter: Payer: Self-pay | Admitting: Internal Medicine

## 2022-05-03 ENCOUNTER — Other Ambulatory Visit: Payer: Self-pay | Admitting: Family Medicine

## 2022-05-15 ENCOUNTER — Telehealth: Payer: Self-pay

## 2022-05-15 NOTE — Telephone Encounter (Signed)
(  1:08 pm) PC SW completed a call to patient's daughter-Jeannine to discuss patient's status. Patient condition is fairly stable at this time. SW advised daughter that patient will be disenrolled from palliative care services at this time. SW strongly encouraged her to call back is patient has a change in condition/decline. No other concerns were noted at this time.   *Palliative care is signing off of patient's care at his time.

## 2022-06-09 ENCOUNTER — Ambulatory Visit (INDEPENDENT_AMBULATORY_CARE_PROVIDER_SITE_OTHER): Payer: Medicare Other

## 2022-06-09 VITALS — Ht 66.0 in | Wt 145.0 lb

## 2022-06-09 DIAGNOSIS — Z0000 Encounter for general adult medical examination without abnormal findings: Secondary | ICD-10-CM

## 2022-06-09 NOTE — Patient Instructions (Signed)
Tammy Lloyd , Thank you for taking time to come for your Medicare Wellness Visit. I appreciate your ongoing commitment to your health goals. Please review the following plan we discussed and let me know if I can assist you in the future.   Screening recommendations/referrals: Colonoscopy: not required Mammogram: not required Bone Density: completed 05/14/2014 Recommended yearly ophthalmology/optometry visit for glaucoma screening and checkup Recommended yearly dental visit for hygiene and checkup  Vaccinations: Influenza vaccine: decline Pneumococcal vaccine: completed 12/28/2016 Tdap vaccine: completed 06/15/2016, due 06/15/2026 Shingles vaccine: completed   Covid-19: 08/11/2020, 10/07/2019, 09/18/2019  Advanced directives: Please bring a copy of your POA (Power of Attorney) and/or Living Will to your next appointment.   Conditions/risks identified: none  Next appointment: Follow up in one year for your annual wellness visit    Preventive Care 65 Years and Older, Female Preventive care refers to lifestyle choices and visits with your health care provider that can promote health and wellness. What does preventive care include? A yearly physical exam. This is also called an annual well check. Dental exams once or twice a year. Routine eye exams. Ask your health care provider how often you should have your eyes checked. Personal lifestyle choices, including: Daily care of your teeth and gums. Regular physical activity. Eating a healthy diet. Avoiding tobacco and drug use. Limiting alcohol use. Practicing safe sex. Taking low-dose aspirin every day. Taking vitamin and mineral supplements as recommended by your health care provider. What happens during an annual well check? The services and screenings done by your health care provider during your annual well check will depend on your age, overall health, lifestyle risk factors, and family history of disease. Counseling  Your health care  provider may ask you questions about your: Alcohol use. Tobacco use. Drug use. Emotional well-being. Home and relationship well-being. Sexual activity. Eating habits. History of falls. Memory and ability to understand (cognition). Work and work Statistician. Reproductive health. Screening  You may have the following tests or measurements: Height, weight, and BMI. Blood pressure. Lipid and cholesterol levels. These may be checked every 5 years, or more frequently if you are over 30 years old. Skin check. Lung cancer screening. You may have this screening every year starting at age 41 if you have a 30-pack-year history of smoking and currently smoke or have quit within the past 15 years. Fecal occult blood test (FOBT) of the stool. You may have this test every year starting at age 45. Flexible sigmoidoscopy or colonoscopy. You may have a sigmoidoscopy every 5 years or a colonoscopy every 10 years starting at age 77. Hepatitis C blood test. Hepatitis B blood test. Sexually transmitted disease (STD) testing. Diabetes screening. This is done by checking your blood sugar (glucose) after you have not eaten for a while (fasting). You may have this done every 1-3 years. Bone density scan. This is done to screen for osteoporosis. You may have this done starting at age 68. Mammogram. This may be done every 1-2 years. Talk to your health care provider about how often you should have regular mammograms. Talk with your health care provider about your test results, treatment options, and if necessary, the need for more tests. Vaccines  Your health care provider may recommend certain vaccines, such as: Influenza vaccine. This is recommended every year. Tetanus, diphtheria, and acellular pertussis (Tdap, Td) vaccine. You may need a Td booster every 10 years. Zoster vaccine. You may need this after age 3. Pneumococcal 13-valent conjugate (PCV13) vaccine. One dose is  recommended after age  76. Pneumococcal polysaccharide (PPSV23) vaccine. One dose is recommended after age 10. Talk to your health care provider about which screenings and vaccines you need and how often you need them. This information is not intended to replace advice given to you by your health care provider. Make sure you discuss any questions you have with your health care provider. Document Released: 07/23/2015 Document Revised: 03/15/2016 Document Reviewed: 04/27/2015 Elsevier Interactive Patient Education  2017 [employer] Prevention in the Home Falls can cause injuries. They can happen to people of all ages. There are many things you can do to make your home safe and to help prevent falls. What can I do on the outside of my home? Regularly fix the edges of walkways and driveways and fix any cracks. Remove anything that might make you trip as you walk through a door, such as a raised step or threshold. Trim any bushes or trees on the path to your home. Use bright outdoor lighting. Clear any walking paths of anything that might make someone trip, such as rocks or tools. Regularly check to see if handrails are loose or broken. Make sure that both sides of any steps have handrails. Any raised decks and porches should have guardrails on the edges. Have any leaves, snow, or ice cleared regularly. Use sand or salt on walking paths during winter. Clean up any spills in your garage right away. This includes oil or grease spills. What can I do in the bathroom? Use night lights. Install grab bars by the toilet and in the tub and shower. Do not use towel bars as grab bars. Use non-skid mats or decals in the tub or shower. If you need to sit down in the shower, use a plastic, non-slip stool. Keep the floor dry. Clean up any water that spills on the floor as soon as it happens. Remove soap buildup in the tub or shower regularly. Attach bath mats securely with double-sided non-slip rug tape. Do not have throw  rugs and other things on the floor that can make you trip. What can I do in the bedroom? Use night lights. Make sure that you have a light by your bed that is easy to reach. Do not use any sheets or blankets that are too big for your bed. They should not hang down onto the floor. Have a firm chair that has side arms. You can use this for support while you get dressed. Do not have throw rugs and other things on the floor that can make you trip. What can I do in the kitchen? Clean up any spills right away. Avoid walking on wet floors. Keep items that you use a lot in easy-to-reach places. If you need to reach something above you, use a strong step stool that has a grab bar. Keep electrical cords out of the way. Do not use floor polish or wax that makes floors slippery. If you must use wax, use non-skid floor wax. Do not have throw rugs and other things on the floor that can make you trip. What can I do with my stairs? Do not leave any items on the stairs. Make sure that there are handrails on both sides of the stairs and use them. Fix handrails that are broken or loose. Make sure that handrails are as long as the stairways. Check any carpeting to make sure that it is firmly attached to the stairs. Fix any carpet that is loose or worn. Avoid having  throw rugs at the top or bottom of the stairs. If you do have throw rugs, attach them to the floor with carpet tape. Make sure that you have a light switch at the top of the stairs and the bottom of the stairs. If you do not have them, ask someone to add them for you. What else can I do to help prevent falls? Wear shoes that: Do not have high heels. Have rubber bottoms. Are comfortable and fit you well. Are closed at the toe. Do not wear sandals. If you use a stepladder: Make sure that it is fully opened. Do not climb a closed stepladder. Make sure that both sides of the stepladder are locked into place. Ask someone to hold it for you, if  possible. Clearly mark and make sure that you can see: Any grab bars or handrails. First and last steps. Where the edge of each step is. Use tools that help you move around (mobility aids) if they are needed. These include: Canes. Walkers. Scooters. Crutches. Turn on the lights when you go into a dark area. Replace any light bulbs as soon as they burn out. Set up your furniture so you have a clear path. Avoid moving your furniture around. If any of your floors are uneven, fix them. If there are any pets around you, be aware of where they are. Review your medicines with your doctor. Some medicines can make you feel dizzy. This can increase your chance of falling. Ask your doctor what other things that you can do to help prevent falls. This information is not intended to replace advice given to you by your health care provider. Make sure you discuss any questions you have with your health care provider. Document Released: 04/22/2009 Document Revised: 12/02/2015 Document Reviewed: 07/31/2014 Elsevier Interactive Patient Education  2017 [employer].

## 2022-06-09 NOTE — Progress Notes (Signed)
I connected with Tammy Lloyd today by telephone and verified that I am speaking with the correct person using two identifiers. Location patient: home Location provider: work Persons participating in the virtual visit: [place], Martin LPN.   I discussed the limitations, risks, security and privacy concerns of performing an evaluation and management service by telephone and the availability of in person appointments. I also discussed with the patient that there may be a patient responsible charge related to this service. The patient expressed understanding and verbally consented to this telephonic visit.    Interactive audio and video telecommunications were attempted between this provider and patient, however failed, due to patient having technical difficulties OR patient did not have access to video capability.  We continued and completed visit with audio only.     Vital signs may be patient reported or missing.  Subjective:   Tammy Lloyd is a 88 y.o. female who presents for Medicare Annual (Subsequent) preventive examination.  Review of Systems     Cardiac Risk Factors include: advanced age (>59mn, >>36women);hypertension     Objective:    Today's Vitals   06/09/22 1158  Weight: 145 lb (65.8 kg)  Height: '5\' 6"'$  (1.676 m)   Body mass index is 23.4 kg/m.     06/09/2022   12:01 PM 05/27/2021   12:07 PM 12/28/2016   11:29 AM 07/21/2016    2:00 AM 07/20/2016    6:45 PM 06/19/2014    2:50 PM 10/06/2013    9:00 PM  Advanced Directives  Does Patient Have a Medical Advance Directive? Yes Yes Yes Yes Yes Yes Patient has advance directive, copy not in chart  [place]  Living will Living will Living will Living will  Does patient want to make changes to medical advance directive?   No - Patient declined No - Patient declined  No - Patient declined No change requested  Copy of  [place] Ildefonso [place] Chart? No - copy requested No - copy requested    No - copy requested     Current Medications (verified) Outpatient Encounter Medications as of 06/09/2022  Medication Sig   acetaminophen (TYLENOL) 325 MG tablet Take 650 mg by mouth every 6 (six) hours as needed for headache (or pain). Reported on 10/14/2015   amLODipine (NORVASC) 5 MG tablet TAKE 1 TABLET BY MOUTH DAILY   furosemide (LASIX) 40 MG tablet Take 1 tablet (40 mg total) by mouth daily.   hydrALAZINE (APRESOLINE) 10 MG tablet TAKE 1 TABLET BY MOUTH TWICE  DAILY   Multiple Vitamins-Minerals (PRESERVISION AREDS 2) CAPS Take 1 tablet by mouth 2 (two) times daily.   pravastatin (PRAVACHOL) 20 MG tablet TAKE 1 TABLET BY MOUTH AT  BEDTIME   valsartan-hydrochlorothiazide (DIOVAN-HCT) 160-12.5 MG tablet TAKE 1 TABLET BY MOUTH DAILY   XARELTO 15 MG TABS tablet TAKE 1 TABLET BY MOUTH DAILY  WITH SUPPER   mupirocin ointment (BACTROBAN) 2 % Place 1 application into the nose 3 (three) times daily. (Patient not taking: Reported on 09/20/2021)   No facility-administered encounter medications on file as of 06/09/2022.    Allergies (verified) Tape   History: Past Medical History:  Diagnosis Date   Arthritis    Cancer [place]    Breast   Cataract    Colon polyp    Edema of both legs    Hemochromatosis    Followed by  hematology;   Hemorrhoid  Hypertension    Idiopathic hemochromatosis 9/83/3825   Lichen sclerosus    Non-obstructive CAD (coronary artery disease)    a. 10/2011 [facility]): LM nl, LAD 5/10ost, 10/15p, D1 nl, LCX 10/15ost, OM1/2 small, OM3 nl, RCA large/nl, RPDA/RPLV nl, EF 55-60%.   Osteoporosis 2005   DEXA 2015 T score -1.7 stable from prior DEXA 2011 history of Fosamax 8 years. On drug-free holiday now   Persistent atrial fibrillation ([place])    a. CHA2DS2VASc = 5 -->Xarelto;  b. 07/2016 Echo: EF 55-60%, no rwma, mild MR, mild biatrial enlargement.   Pulmonary hypertension ([place])    Stroke  ([place])    a. 07/2016 MRI: punctate acute ischemia in the R mid brain and possibly within the right pons w/o assoc hemorrhage or mass effect. Chronic microvascular ischemia.   Past Surgical History:  Procedure Laterality Date   BACK SURGERY  2008   BREAST LUMPECTOMY     Right   CATARACT EXTRACTION, BILATERAL     COLONOSCOPY  2009   DILATION AND CURETTAGE OF UTERUS  2005   HYSTEROSCOPY  08/2003   LEFT HEART CATHETERIZATION WITH CORONARY ANGIOGRAM N/A 10/24/2011   Procedure: LEFT HEART CATHETERIZATION WITH CORONARY ANGIOGRAM;  Surgeon: Clent Demark, MD;  Location: [facility] CATH LAB;  Service: Cardiovascular;  Laterality: N/A;   OTHER SURGICAL HISTORY Left 10/02/2017   biopsy of left eyelid   TOTAL KNEE ARTHROPLASTY  2006   Family History  Problem Relation Age of Onset   Cancer Mother 60       GYN cancer (had hysterectomy)   Heart disease Mother        CHF   Cancer Father        liver   Asthma Sister    Breast cancer Sister 30   Heart disease Brother        rheumatic heart attack   Breast cancer Sister 31   Asthma Brother    Cancer Brother        Prostate   Cancer Brother        Prostate   Social History   Socioeconomic History   Marital status: Widowed    Spouse name: Not on file   Number of children: Not on file   Years of education: Not on file   Highest education level: Not on file  Occupational History   Not on file  Tobacco Use   Smoking status: Never   Smokeless tobacco: Never  Vaping Use   Vaping Use: Never used  Substance and Sexual Activity   Alcohol use: No    Alcohol/week: 0.0 standard drinks of alcohol   Drug use: No   Sexual activity: Not Currently    Birth control/protection: Post-menopausal    Comment: 1st intercourse 88 yo-Fewer than 5 partners  Other Topics Concern   Not on file  Social History Narrative   09/12/17 Lives alone. No pets   Social Determinants of Health   Financial Resource Strain: Low Risk  (06/09/2022)   Overall Financial Resource  Strain (CARDIA)    Difficulty of Paying Living Expenses: Not hard at all  Food Insecurity: No Food Insecurity (06/09/2022)   Hunger Vital Sign    Worried About Running Out of Food in the Last Year: Never true    Ran Out of Food in the Last Year: Never true  Transportation Needs: No Transportation Needs (06/09/2022)   PRAPARE - Hydrologist (Medical): No    Lack of Transportation (Non-Medical): No  Physical Activity: Inactive (06/09/2022)   Exercise Vital Sign    Days of Exercise per Week: 0 days    Minutes of Exercise per Session: 0 min  Stress: No Stress Concern Present (06/09/2022)   [place]    Feeling of Stress : Not at all  Social Connections: Not on file    Tobacco Counseling Counseling given: Not Answered   Clinical Intake:  Pre-visit preparation completed: Yes  Pain : No/denies pain     Nutritional Status: BMI of 19-24  Normal Nutritional Risks: None Diabetes: No  How often do you need to have someone help you when you read instructions, pamphlets, or other written materials from your doctor or pharmacy?: 4 - Often  Diabetic? no  Interpreter Needed?: No  Information entered by :: NAllen LPN   Activities of Daily Living    06/09/2022   12:02 PM  In your present state of health, do you have any difficulty performing the following activities:  Hearing? 1  Vision? 1  Difficulty concentrating or making decisions? 1  Walking or climbing stairs? 1  Dressing or bathing? 1  Doing errands, shopping? 1  Preparing Food and eating ? Y  Using the Toilet? Y  In the past six months, have you accidently leaked urine? Y  Do you have problems with loss of bowel control? Y  Managing your Medications? Y  Managing your Finances? Y  Housekeeping or managing your Housekeeping? Y    Patient Care Team: Denita Lung, MD as PCP - General (Family Medicine) Jerline Pain, MD as  PCP - Cardiology (Cardiology) Garvin Fila, MD as Consulting Physician (Neurology) Michael Boston, MD as Consulting Physician (General Surgery) Viona Gilmore, [facility] as Pharmacist (Pharmacist)  Indicate any recent Medical Services you may have received from other than [facility] providers in the past year (date may be approximate).     Assessment:   This is a routine wellness examination for Dominica.  Hearing/Vision screen Vision Screening - Comments:: No regular eye exams,  Dietary issues and exercise activities discussed: Current Exercise Habits: The patient does not participate in regular exercise at present   Goals Addressed             This Visit's Progress    Patient Stated       06/09/2022, no goals       Depression Screen    06/09/2022   12:02 PM 05/27/2021   12:09 PM 04/04/2021    9:54 AM 02/25/2019    9:53 AM 01/31/2018   10:14 AM 12/28/2016   10:49 AM 06/13/2016   10:04 AM  PHQ 2/9 Scores  PHQ - 2 Score 0 0 0 0 0 0 0  PHQ- 9 Score 0          Fall Risk    06/09/2022   12:02 PM 05/27/2021   12:08 PM 02/25/2019    9:52 AM 01/31/2018   10:14 AM 09/12/2017   10:19 AM  Fall Risk   Falls in the past year? 0 1 0 Yes No  Comment  legs gave out     Number falls in past yr: 0 0  2 or more   Injury with Fall? 0 0  No   Risk for fall due to : Medication side effect;Impaired mobility Impaired balance/gait;Impaired mobility;Medication side effect  Impaired balance/gait   Follow up Falls prevention discussed;Falls evaluation completed;Education provided   Falls prevention discussed     FALL  RISK PREVENTION PERTAINING TO THE HOME:  Any stairs in or around the home? No  If so, are there any without handrails?  ramp Home free of loose throw rugs in walkways, pet beds, electrical cords, etc? Yes  Adequate lighting in your home to reduce risk of falls? Yes   ASSISTIVE DEVICES UTILIZED TO PREVENT FALLS:  Life alert? No  Use of a cane, walker or w/c? Yes  Grab bars in the  bathroom?  Has bed baths Shower chair or bench in shower?  Has bed baths Elevated toilet seat or a handicapped toilet?  incontinent  TIMED UP AND GO:  Was the test performed? No .  .     Cognitive Function:  6 CIT not administered. Daughter states has some trouble with cognition at times.        Immunizations Immunization History  Administered Date(s) Administered   DTaP 01/19/1981, 06/20/1999   PFIZER(Purple Top)SARS-COV-2 Vaccination 09/18/2019, 10/07/2019, 08/11/2020   PPD Test 01/19/1981   Pneumococcal Conjugate-13 12/28/2016   Pneumococcal Polysaccharide-23 12/27/2001, 02/03/2013   Tdap 06/15/2016   Zoster Recombinat (Shingrix) 02/12/2017, 06/29/2017    TDAP status: Up to date  Flu Vaccine status: Declined, Education has been provided regarding the importance of this vaccine but patient still declined. Advised may receive this vaccine at local pharmacy or Health Dept. Aware to provide a copy of the vaccination record if obtained from local pharmacy or Health Dept. Verbalized acceptance and understanding.  Pneumococcal vaccine status: Up to date  Covid-19 vaccine status: Completed vaccines  Qualifies for Shingles Vaccine? Yes   Zostavax completed Yes   Shingrix Completed?: Yes  Screening Tests Health Maintenance  Topic Date Due   INFLUENZA VACCINE  Never done   COVID-19 Vaccine (4 - 2023-24 season) 03/10/2022   DTaP/Tdap/Td (4 - Td or Tdap) 06/15/2026   Pneumonia Vaccine 58+ Years old  Completed   DEXA SCAN  Completed   Zoster Vaccines- Shingrix  Completed   HPV VACCINES  Aged Out    Health Maintenance  Health Maintenance Due  Topic Date Due   INFLUENZA VACCINE  Never done   COVID-19 Vaccine (4 - 2023-24 season) 03/10/2022    Colorectal cancer screening: No longer required.   Mammogram status: No longer required due to age.  Bone Density status: Completed 05/14/2014  Lung Cancer Screening: (Low Dose CT Chest recommended if Age 12-80 years, 30  pack-year currently smoking OR have quit w/in 15years.) does not qualify.   Lung Cancer Screening Referral: no  Additional Screening:  Hepatitis C Screening: does not qualify;   Vision Screening: Recommended annual ophthalmology exams for early detection of glaucoma and other disorders of the eye. Is the patient up to date with their annual eye exam?  No  Who is the provider or what is the name of the office in which the patient attends annual eye exams? none If pt is not established with a provider, would they like to be referred to a provider to establish care? No .   Dental Screening: Recommended annual dental exams for proper oral hygiene  Community Resource Referral / Chronic Care Management: CRR required this visit?  No   CCM required this visit?  No      Plan:     I have personally reviewed and noted the following in the patient's chart:   Medical and social history Use of alcohol, tobacco or illicit drugs  Current medications and supplements including opioid prescriptions. Patient is not currently taking opioid prescriptions. Functional ability  and status Nutritional status Physical activity Advanced directives List of other physicians Hospitalizations, surgeries, and ER visits in previous 12 months Vitals Screenings to include cognitive, depression, and falls Referrals and appointments  In addition, I have reviewed and discussed with patient certain preventive protocols, quality metrics, and best practice recommendations. A written personalized care plan for preventive services as well as general preventive health recommendations were provided to patient.     Kellie Simmering, LPN   99/08/4266   Nurse Notes: none  Due to this being a virtual visit, the after visit summary with patients personalized plan was offered to patient via mail or my-chart.  Patient would like to access on my-chart

## 2022-08-21 ENCOUNTER — Other Ambulatory Visit: Payer: Self-pay | Admitting: Family Medicine

## 2022-09-25 ENCOUNTER — Other Ambulatory Visit: Payer: Self-pay | Admitting: Family Medicine

## 2022-11-10 ENCOUNTER — Other Ambulatory Visit: Payer: Self-pay | Admitting: Family Medicine

## 2022-11-13 NOTE — Telephone Encounter (Signed)
Is it ok to refill? Last office visit with Dr. Susann Givens was virtual on 12/21/2021.

## 2022-12-30 ENCOUNTER — Other Ambulatory Visit: Payer: Self-pay

## 2022-12-30 ENCOUNTER — Emergency Department (HOSPITAL_COMMUNITY): Payer: Medicare Other

## 2022-12-30 ENCOUNTER — Encounter (HOSPITAL_COMMUNITY): Payer: Self-pay

## 2022-12-30 ENCOUNTER — Inpatient Hospital Stay (HOSPITAL_COMMUNITY)
Admission: EM | Admit: 2022-12-30 | Discharge: 2023-01-06 | DRG: 378 | Disposition: A | Discharge status: Hospice | Payer: Medicare Other | Attending: Internal Medicine | Admitting: Internal Medicine

## 2022-12-30 DIAGNOSIS — E785 Hyperlipidemia, unspecified: Secondary | ICD-10-CM | POA: Diagnosis present

## 2022-12-30 DIAGNOSIS — I959 Hypotension, unspecified: Secondary | ICD-10-CM | POA: Diagnosis not present

## 2022-12-30 DIAGNOSIS — Z86010 Personal history of colonic polyps: Secondary | ICD-10-CM

## 2022-12-30 DIAGNOSIS — N179 Acute kidney failure, unspecified: Secondary | ICD-10-CM | POA: Diagnosis present

## 2022-12-30 DIAGNOSIS — R231 Pallor: Secondary | ICD-10-CM | POA: Diagnosis not present

## 2022-12-30 DIAGNOSIS — K6289 Other specified diseases of anus and rectum: Secondary | ICD-10-CM | POA: Diagnosis present

## 2022-12-30 DIAGNOSIS — K921 Melena: Secondary | ICD-10-CM | POA: Diagnosis not present

## 2022-12-30 DIAGNOSIS — R195 Other fecal abnormalities: Secondary | ICD-10-CM | POA: Diagnosis not present

## 2022-12-30 DIAGNOSIS — K922 Gastrointestinal hemorrhage, unspecified: Secondary | ICD-10-CM | POA: Diagnosis not present

## 2022-12-30 DIAGNOSIS — I4891 Unspecified atrial fibrillation: Secondary | ICD-10-CM | POA: Diagnosis not present

## 2022-12-30 DIAGNOSIS — Z86011 Personal history of benign neoplasm of the brain: Secondary | ICD-10-CM

## 2022-12-30 DIAGNOSIS — K4090 Unilateral inguinal hernia, without obstruction or gangrene, not specified as recurrent: Secondary | ICD-10-CM | POA: Diagnosis not present

## 2022-12-30 DIAGNOSIS — I10 Essential (primary) hypertension: Secondary | ICD-10-CM | POA: Diagnosis present

## 2022-12-30 DIAGNOSIS — Z853 Personal history of malignant neoplasm of breast: Secondary | ICD-10-CM | POA: Diagnosis not present

## 2022-12-30 DIAGNOSIS — R197 Diarrhea, unspecified: Secondary | ICD-10-CM | POA: Diagnosis not present

## 2022-12-30 DIAGNOSIS — D649 Anemia, unspecified: Secondary | ICD-10-CM | POA: Insufficient documentation

## 2022-12-30 DIAGNOSIS — Z96659 Presence of unspecified artificial knee joint: Secondary | ICD-10-CM | POA: Diagnosis present

## 2022-12-30 DIAGNOSIS — R609 Edema, unspecified: Secondary | ICD-10-CM | POA: Diagnosis not present

## 2022-12-30 DIAGNOSIS — Z8249 Family history of ischemic heart disease and other diseases of the circulatory system: Secondary | ICD-10-CM

## 2022-12-30 DIAGNOSIS — I129 Hypertensive chronic kidney disease with stage 1 through stage 4 chronic kidney disease, or unspecified chronic kidney disease: Secondary | ICD-10-CM | POA: Diagnosis present

## 2022-12-30 DIAGNOSIS — L89152 Pressure ulcer of sacral region, stage 2: Secondary | ICD-10-CM | POA: Diagnosis present

## 2022-12-30 DIAGNOSIS — Z7401 Bed confinement status: Secondary | ICD-10-CM

## 2022-12-30 DIAGNOSIS — Z803 Family history of malignant neoplasm of breast: Secondary | ICD-10-CM

## 2022-12-30 DIAGNOSIS — M810 Age-related osteoporosis without current pathological fracture: Secondary | ICD-10-CM | POA: Diagnosis present

## 2022-12-30 DIAGNOSIS — I4819 Other persistent atrial fibrillation: Secondary | ICD-10-CM | POA: Diagnosis present

## 2022-12-30 DIAGNOSIS — H3530 Unspecified macular degeneration: Secondary | ICD-10-CM | POA: Diagnosis not present

## 2022-12-30 DIAGNOSIS — N1831 Chronic kidney disease, stage 3a: Secondary | ICD-10-CM | POA: Diagnosis present

## 2022-12-30 DIAGNOSIS — Z7901 Long term (current) use of anticoagulants: Secondary | ICD-10-CM

## 2022-12-30 DIAGNOSIS — Z825 Family history of asthma and other chronic lower respiratory diseases: Secondary | ICD-10-CM | POA: Diagnosis not present

## 2022-12-30 DIAGNOSIS — L8990 Pressure ulcer of unspecified site, unspecified stage: Secondary | ICD-10-CM | POA: Diagnosis present

## 2022-12-30 DIAGNOSIS — R001 Bradycardia, unspecified: Secondary | ICD-10-CM | POA: Diagnosis not present

## 2022-12-30 DIAGNOSIS — Z9841 Cataract extraction status, right eye: Secondary | ICD-10-CM | POA: Diagnosis not present

## 2022-12-30 DIAGNOSIS — H9190 Unspecified hearing loss, unspecified ear: Secondary | ICD-10-CM | POA: Diagnosis present

## 2022-12-30 DIAGNOSIS — M1990 Unspecified osteoarthritis, unspecified site: Secondary | ICD-10-CM | POA: Diagnosis present

## 2022-12-30 DIAGNOSIS — I517 Cardiomegaly: Secondary | ICD-10-CM | POA: Diagnosis not present

## 2022-12-30 DIAGNOSIS — D62 Acute posthemorrhagic anemia: Secondary | ICD-10-CM | POA: Diagnosis present

## 2022-12-30 DIAGNOSIS — Z66 Do not resuscitate: Secondary | ICD-10-CM | POA: Diagnosis present

## 2022-12-30 DIAGNOSIS — Z8673 Personal history of transient ischemic attack (TIA), and cerebral infarction without residual deficits: Secondary | ICD-10-CM

## 2022-12-30 DIAGNOSIS — I2510 Atherosclerotic heart disease of native coronary artery without angina pectoris: Secondary | ICD-10-CM | POA: Diagnosis present

## 2022-12-30 DIAGNOSIS — Z9842 Cataract extraction status, left eye: Secondary | ICD-10-CM | POA: Diagnosis not present

## 2022-12-30 DIAGNOSIS — N189 Chronic kidney disease, unspecified: Secondary | ICD-10-CM | POA: Diagnosis not present

## 2022-12-30 DIAGNOSIS — R58 Hemorrhage, not elsewhere classified: Secondary | ICD-10-CM | POA: Diagnosis not present

## 2022-12-30 DIAGNOSIS — K920 Hematemesis: Secondary | ICD-10-CM | POA: Diagnosis not present

## 2022-12-30 DIAGNOSIS — Z79899 Other long term (current) drug therapy: Secondary | ICD-10-CM

## 2022-12-30 DIAGNOSIS — R1111 Vomiting without nausea: Secondary | ICD-10-CM | POA: Diagnosis not present

## 2022-12-30 DIAGNOSIS — R262 Difficulty in walking, not elsewhere classified: Secondary | ICD-10-CM | POA: Diagnosis not present

## 2022-12-30 DIAGNOSIS — I272 Pulmonary hypertension, unspecified: Secondary | ICD-10-CM | POA: Diagnosis present

## 2022-12-30 DIAGNOSIS — R54 Age-related physical debility: Secondary | ICD-10-CM | POA: Diagnosis present

## 2022-12-30 DIAGNOSIS — R000 Tachycardia, unspecified: Secondary | ICD-10-CM | POA: Diagnosis not present

## 2022-12-30 DIAGNOSIS — R918 Other nonspecific abnormal finding of lung field: Secondary | ICD-10-CM | POA: Diagnosis not present

## 2022-12-30 LAB — COMPREHENSIVE METABOLIC PANEL
ALT: 8 U/L (ref 0–44)
AST: 14 U/L — ABNORMAL LOW (ref 15–41)
Albumin: 3.1 g/dL — ABNORMAL LOW (ref 3.5–5.0)
Alkaline Phosphatase: 46 U/L (ref 38–126)
Anion gap: 13 (ref 5–15)
BUN: 140 mg/dL — ABNORMAL HIGH (ref 8–23)
CO2: 18 mmol/L — ABNORMAL LOW (ref 22–32)
Calcium: 7.9 mg/dL — ABNORMAL LOW (ref 8.9–10.3)
Chloride: 104 mmol/L (ref 98–111)
Creatinine, Ser: 2.58 mg/dL — ABNORMAL HIGH (ref 0.44–1.00)
GFR, Estimated: 17 mL/min — ABNORMAL LOW (ref >60)
Glucose, Bld: 159 mg/dL — ABNORMAL HIGH (ref 70–99)
Potassium: 4.5 mmol/L (ref 3.5–5.1)
Sodium: 135 mmol/L (ref 135–145)
Total Bilirubin: <0.1 mg/dL — ABNORMAL LOW (ref 0.3–1.2)
Total Protein: 5 g/dL — ABNORMAL LOW (ref 6.5–8.1)

## 2022-12-30 LAB — CBC WITH DIFFERENTIAL/PLATELET
Abs Immature Granulocytes: 0.03 10*3/uL (ref 0.00–0.07)
Basophils Absolute: 0 10*3/uL (ref 0.0–0.1)
Basophils Relative: 0 %
Eosinophils Absolute: 0.1 10*3/uL (ref 0.0–0.5)
Eosinophils Relative: 1 %
HCT: 13.8 % — ABNORMAL LOW (ref 36.0–46.0)
Hemoglobin: 4.3 g/dL — CL (ref 12.0–15.0)
Immature Granulocytes: 0 %
Lymphocytes Relative: 9 %
Lymphs Abs: 0.7 10*3/uL (ref 0.7–4.0)
MCH: 33.1 pg (ref 26.0–34.0)
MCHC: 31.2 g/dL (ref 30.0–36.0)
MCV: 106.2 fL — ABNORMAL HIGH (ref 80.0–100.0)
Monocytes Absolute: 0.7 10*3/uL (ref 0.1–1.0)
Monocytes Relative: 9 %
Neutro Abs: 6.4 10*3/uL (ref 1.7–7.7)
Neutrophils Relative %: 81 %
Platelets: 262 10*3/uL (ref 150–400)
RBC: 1.3 MIL/uL — ABNORMAL LOW (ref 3.87–5.11)
RDW: 13.4 % (ref 11.5–15.5)
WBC: 7.8 10*3/uL (ref 4.0–10.5)
nRBC: 0 % (ref 0.0–0.2)

## 2022-12-30 LAB — TROPONIN I (HIGH SENSITIVITY)
Troponin I (High Sensitivity): 39 ng/L — ABNORMAL HIGH (ref <18)
Troponin I (High Sensitivity): 39 ng/L — ABNORMAL HIGH (ref <18)

## 2022-12-30 LAB — VITAMIN B12: Vitamin B-12: 448 pg/mL (ref 180–914)

## 2022-12-30 LAB — TYPE AND SCREEN

## 2022-12-30 LAB — BPAM RBC
Blood Product Expiration Date: 202407202359
Unit Type and Rh: 5100

## 2022-12-30 LAB — POC OCCULT BLOOD, ED: Fecal Occult Bld: POSITIVE — AB

## 2022-12-30 LAB — PREPARE RBC (CROSSMATCH)

## 2022-12-30 LAB — FOLATE: Folate: 19 ng/mL (ref >5.9)

## 2022-12-30 MED ORDER — PANTOPRAZOLE SODIUM 40 MG IV SOLR
40.0000 mg | Freq: Once | INTRAVENOUS | Status: AC
  Administered 2022-12-30: 40 mg via INTRAVENOUS
  Filled 2022-12-30: qty 10

## 2022-12-30 MED ORDER — PANTOPRAZOLE INFUSION (NEW) - SIMPLE MED
8.0000 mg/h | INTRAVENOUS | Status: DC
  Administered 2022-12-30 – 2023-01-01 (×4): 8 mg/h via INTRAVENOUS
  Filled 2022-12-30 (×6): qty 100

## 2022-12-30 MED ORDER — PRAVASTATIN SODIUM 10 MG PO TABS
20.0000 mg | ORAL_TABLET | Freq: Every day | ORAL | Status: DC
  Administered 2022-12-31 – 2023-01-05 (×6): 20 mg via ORAL
  Filled 2022-12-30 (×6): qty 2

## 2022-12-30 MED ORDER — ACETAMINOPHEN 650 MG RE SUPP: 650.0000 mg | Freq: Four times a day (QID) | RECTAL | Status: DC | PRN

## 2022-12-30 MED ORDER — SODIUM CHLORIDE 0.9% FLUSH
3.0000 mL | Freq: Two times a day (BID) | INTRAVENOUS | Status: DC
  Administered 2022-12-30 – 2023-01-06 (×14): 3 mL via INTRAVENOUS

## 2022-12-30 MED ORDER — ACETAMINOPHEN 325 MG PO TABS: 650.0000 mg | ORAL_TABLET | Freq: Four times a day (QID) | ORAL | Status: DC | PRN

## 2022-12-30 MED ORDER — SODIUM CHLORIDE 0.9% IV SOLUTION: Freq: Once | INTRAVENOUS | Status: AC

## 2022-12-30 MED ORDER — PANTOPRAZOLE SODIUM 40 MG IV SOLR: 40.0000 mg | Freq: Two times a day (BID) | INTRAVENOUS | Status: DC

## 2022-12-30 NOTE — ED Notes (Signed)
ED TO INPATIENT HANDOFF REPORT  ED Nurse Name and Phone #: [phone]  S Name/Age/Gender Tammy Lloyd 89 y.o. female Room/Bed: 018C/018C  Code Status   Code Status: DNR  Home/SNF/Other Home Patient oriented to: self and place Is this baseline? Yes   Triage Complete: Triage complete  Chief Complaint Acute GI bleeding [K92.2]  Triage Note Pt bib ems from home c/o rectal bleeding that started a few days ago. Pt initially thought it was diarrhea but last night the stool got worse where pt was soiling through briefs more than usual. EMS noted pt had dark tarry stool. Pt BP initially 96/32 and received 500 cc Normal Saline new BP 112/36. Pt has hx of Afib (Xarelto)   RR 17 HR 44 RA 100%   Allergies Allergies  Allergen Reactions   Tape Other (See Comments)    Skin is thin and may tear or bruise easily    Level of Care/Admitting Diagnosis ED Disposition     ED Disposition  Admit   Condition  --   Comment  Hospital Area: [facility] [[id number]]  Level of Care: Progressive [102]  Admit to Progressive based on following criteria: GI, ENDOCRINE disease patients with GI bleeding, acute liver failure or pancreatitis, stable with diabetic ketoacidosis or thyrotoxicosis (hypothyroid) state.  May place patient in observation at [facility] or [facility] if equivalent level of care is available:: No  Covid Evaluation: Asymptomatic - no recent exposure (last 10 days) testing not required  Diagnosis: Acute GI bleeding [253168]  Admitting Physician: Synetta Fail [[id number]]  Attending Physician: Synetta Fail [[id number]]          B Medical/Surgery History Past Medical History:  Diagnosis Date   Arthritis    Cancer (HCC)    Breast   Cataract    Colon polyp    Edema of both legs    Hemochromatosis    Followed by  hematology;   Hemorrhoid    Hypertension    Idiopathic hemochromatosis 09/28/2011   Lichen sclerosus    Non-obstructive CAD (coronary  artery disease)    a. 10/2011 Cath [facility]): LM nl, LAD 5/10ost, 10/15p, D1 nl, LCX 10/15ost, OM1/2 small, OM3 nl, RCA large/nl, RPDA/RPLV nl, EF 55-60%.   Osteoporosis 2005   DEXA 2015 T score -1.7 stable from prior DEXA 2011 history of Fosamax 8 years. On drug-free holiday now   Persistent atrial fibrillation (HCC)    a. CHA2DS2VASc = 5 -->Xarelto;  b. 07/2016 Echo: EF 55-60%, no rwma, mild MR, mild biatrial enlargement.   Pulmonary hypertension (HCC)    Stroke (HCC)    a. 07/2016 MRI: punctate acute ischemia in the R mid brain and possibly within the right pons w/o assoc hemorrhage or mass effect. Chronic microvascular ischemia.   Past Surgical History:  Procedure Laterality Date   BACK SURGERY  2008   BREAST LUMPECTOMY     Right   CATARACT EXTRACTION, BILATERAL     COLONOSCOPY  2009   DILATION AND CURETTAGE OF UTERUS  2005   HYSTEROSCOPY  08/2003   LEFT HEART CATHETERIZATION WITH CORONARY ANGIOGRAM N/A 10/24/2011   Procedure: LEFT HEART CATHETERIZATION WITH CORONARY ANGIOGRAM;  Surgeon: Robynn Pane, MD;  Location: [facility] CATH LAB;  Service: Cardiovascular;  Laterality: N/A;   OTHER SURGICAL HISTORY Left 10/02/2017   biopsy of left eyelid   TOTAL KNEE ARTHROPLASTY  2006     A IV Location/Drains/Wounds Patient Lines/Drains/Airways Status     Active Line/Drains/Airways  Name Placement date Placement time Site Days   Peripheral IV 12/30/22 20 G Anterior;Left;Proximal Forearm 12/30/22  1328  Forearm  less than 1   Incision 10/24/11 Groin Right 10/24/11  1407  -- 4085            Intake/Output Last 24 hours No intake or output data in the 24 hours ending 12/30/22 1737  Labs/Imaging Results for orders placed or performed during the hospital encounter of 12/30/22 (from the past 48 hour(s))  POC occult blood, ED     Status: Abnormal   Collection Time: 12/30/22  1:26 PM  Result Value Ref Range   Fecal Occult Bld POSITIVE (A) NEGATIVE  CBC with Differential     Status:  Abnormal   Collection Time: 12/30/22  1:42 PM  Result Value Ref Range   WBC 7.8 4.0 - 10.5 K/uL   RBC 1.30 (L) 3.87 - 5.11 MIL/uL   Hemoglobin 4.3 (LL) 12.0 - 15.0 g/dL    Comment: REPEATED TO VERIFY THIS CRITICAL RESULT HAS VERIFIED AND BEEN CALLED TO Jess Barters, RN BY [employer] ON 06 22 2024 AT 1415, AND HAS BEEN READ BACK.     HCT 13.8 (L) 36.0 - 46.0 %   MCV 106.2 (H) 80.0 - 100.0 fL   MCH 33.1 26.0 - 34.0 pg   MCHC 31.2 30.0 - 36.0 g/dL   RDW 16.1 09.6 - 04.5 %   Platelets 262 150 - 400 K/uL   nRBC 0.0 0.0 - 0.2 %   Neutrophils Relative % 81 %   Neutro Abs 6.4 1.7 - 7.7 K/uL   Lymphocytes Relative 9 %   Lymphs Abs 0.7 0.7 - 4.0 K/uL   Monocytes Relative 9 %   Monocytes Absolute 0.7 0.1 - 1.0 K/uL   Eosinophils Relative 1 %   Eosinophils Absolute 0.1 0.0 - 0.5 K/uL   Basophils Relative 0 %   Basophils Absolute 0.0 0.0 - 0.1 K/uL   Immature Granulocytes 0 %   Abs Immature Granulocytes 0.03 0.00 - 0.07 K/uL    Comment: Performed at [facility], [address] [place], [place] [address]  Troponin I (High Sensitivity)     Status: Abnormal   Collection Time: 12/30/22  1:42 PM  Result Value Ref Range   Troponin I (High Sensitivity) 39 (H) <18 ng/L    Comment: (NOTE) Elevated high sensitivity troponin I (hsTnI) values and significant  changes across serial measurements may suggest ACS but many other  chronic and acute conditions are known to elevate hsTnI results.  Refer to the "Links" section for chest pain algorithms and additional  guidance. Performed at [facility], [address], [place], [place] [address]   Comprehensive metabolic panel     Status: Abnormal   Collection Time: 12/30/22  1:42 PM  Result Value Ref Range   Sodium 135 135 - 145 mmol/L   Potassium 4.5 3.5 - 5.1 mmol/L   Chloride 104 98 - 111 mmol/L   CO2 18 (L) 22 - 32 mmol/L   Glucose, Bld 159 (H) 70 - 99 mg/dL    Comment: Glucose reference range applies only to samples  taken after fasting for at least 8 hours.   BUN 140 (H) 8 - 23 mg/dL   Creatinine, Ser 8.29 (H) 0.44 - 1.00 mg/dL   Calcium 7.9 (L) 8.9 - 10.3 mg/dL   Total Protein 5.0 (L) 6.5 - 8.1 g/dL   Albumin 3.1 (L) 3.5 - 5.0 g/dL  AST 14 (L) 15 - 41 U/L   ALT 8 0 - 44 U/L   Alkaline Phosphatase 46 38 - 126 U/L   Total Bilirubin <0.1 (L) 0.3 - 1.2 mg/dL   GFR, Estimated 17 (L) >60 mL/min    Comment: (NOTE) Calculated using the CKD-EPI Creatinine Equation (2021)    Anion gap 13 5 - 15    Comment: Performed at [facility], [address], [place], [place] [address]  Type and screen [facility]     Status: None (Preliminary result)   Collection Time: 12/30/22  1:42 PM  Result Value Ref Range   ABO/RH(D) O POS    Antibody Screen NEG    Sample Expiration 01/02/2023,2359    Unit Number [id number]    Blood Component Type RED CELLS,LR    Unit division 00    Status of Unit ISSUED    Transfusion Status OK TO TRANSFUSE    Crossmatch Result      Compatible Performed at [facility], [address] [place], [place] [address]    Unit Number [id number]    Blood Component Type RED CELLS,LR    Unit division 00    Status of Unit ALLOCATED    Transfusion Status OK TO TRANSFUSE    Crossmatch Result Compatible   Folate     Status: None   Collection Time: 12/30/22  1:42 PM  Result Value Ref Range   Folate 19.0 >5.9 ng/mL    Comment: RESULT CONFIRMED BY MANUAL DILUTION Performed at [facility], [address] [place], [place] [address]   Vitamin B12     Status: None   Collection Time: 12/30/22  1:42 PM  Result Value Ref Range   Vitamin B-12 448 180 - 914 pg/mL    Comment: (NOTE) This assay is not validated for testing neonatal or myeloproliferative syndrome specimens for Vitamin B12 levels. Performed at [facility], [address], [place], [place] [address]   Prepare RBC (crossmatch)     Status: None   Collection Time: 12/30/22  3:00 PM   Result Value Ref Range   Order Confirmation      ORDER PROCESSED BY BLOOD BANK Performed at [facility], [address] [place], [place] [address]   Troponin I (High Sensitivity)     Status: Abnormal   Collection Time: 12/30/22  3:33 PM  Result Value Ref Range   Troponin I (High Sensitivity) 39 (H) <18 ng/L    Comment: (NOTE) Elevated high sensitivity troponin I (hsTnI) values and significant  changes across serial measurements may suggest ACS but many other  chronic and acute conditions are known to elevate hsTnI results.  Refer to the "Links" section for chest pain algorithms and additional  guidance. Performed at [facility], [address], [place], [place] [address]    DG Chest Port 1 View  Result Date: 12/30/2022 CLINICAL DATA:  Weakness. EXAM: PORTABLE CHEST 1 VIEW COMPARISON:  October 06, 2013. FINDINGS: Stable cardiomegaly. Minimal bibasilar subsegmental atelectasis or scarring is noted. Severe degenerative changes seen involving both glenohumeral joints. IMPRESSION: Minimal bibasilar subsegmental atelectasis or scarring. Electronically Signed   By: Lupita Raider M.D.   On: 12/30/2022 14:24    Pending Labs Unresulted Labs (From admission, onward)     Start     Ordered   12/31/22 0500  CBC  Tomorrow morning,   R        12/30/22 1508  12/31/22 0500  Comprehensive metabolic panel  Tomorrow morning,   R        12/30/22 1508   12/30/22 1324  Urinalysis, w/ Reflex to Culture (Infection Suspected) -Urine, Clean Catch  Once,   URGENT       Question:  Specimen Source  Answer:  Urine, Clean Catch   12/30/22 1323            Vitals/Pain Today's Vitals   12/30/22 1535 12/30/22 1545 12/30/22 1600 12/30/22 1715  BP: (!) 109/48 (!) 120/45 (!) 116/33 104/78  Pulse: (!) 57 (!) 122 (!) 48 (!) 51  Resp: 16 18 15 13   Temp: (!) 97.4 F (36.3 C)  (!) 97.5 F (36.4 C)   TempSrc: Oral  Oral   SpO2: 99% 99% 99% 99%  Weight:      Height:      PainSc:         Isolation Precautions No active isolations  Medications Medications  pravastatin (PRAVACHOL) tablet 20 mg (has no administration in time range)  sodium chloride flush (NS) 0.9 % injection 3 mL (3 mLs Intravenous Not Given 12/30/22 1528)  acetaminophen (TYLENOL) tablet 650 mg (has no administration in time range)    Or  acetaminophen (TYLENOL) suppository 650 mg (has no administration in time range)  pantoprozole (PROTONIX) 80 mg /NS 100 mL infusion (has no administration in time range)  pantoprazole (PROTONIX) injection 40 mg (has no administration in time range)  pantoprazole (PROTONIX) injection 40 mg (40 mg Intravenous Given 12/30/22 1347)  0.9 %  sodium chloride infusion (Manually program via Guardrails IV Fluids) ( Intravenous New Bag/Given 12/30/22 1441)  pantoprazole (PROTONIX) injection 40 mg (40 mg Intravenous Given 12/30/22 1441)    Mobility non-ambulatory     Focused Assessments Cardiac Assessment Handoff:  Cardiac Rhythm: Atrial fibrillation Lab Results  Component Value Date   CKTOTAL 152 10/24/2011   CKMB 4.3 (H) 10/24/2011   TROPONINI <0.30 10/06/2013   No results found for: "DDIMER" Does the Patient currently have chest pain? No    R Recommendations: See Admitting Provider Note  Report given to:   Additional Notes:

## 2022-12-30 NOTE — ED Triage Notes (Signed)
Pt bib ems from home c/o rectal bleeding that started a few days ago. Pt initially thought it was diarrhea but last night the stool got worse where pt was soiling through briefs more than usual. EMS noted pt had dark tarry stool. Pt BP initially 96/32 and received 500 cc Normal Saline new BP 112/36. Pt has hx of Afib (Xarelto)   RR 17 HR 44 RA 100%

## 2022-12-30 NOTE — ED Provider Notes (Signed)
[place] EMERGENCY DEPARTMENT AT [facility] Provider Note   CSN: [id number] Arrival date & time: 12/30/22  1310     History  Chief Complaint  Patient presents with   Rectal Bleeding    Tammy Lloyd is a 89 y.o. female.  89 year old female with prior medical history as detailed below presents for evaluation.  Patient brought in by family for evaluation of suspected lower GI bleed.  Patient with melanotic dark stool times at least 24 hours.  Family reports multiple episodes of dark stool.  Patient is on Xarelto.  Her last dose of Xarelto was yesterday.  She takes Xarelto for history of A-fib.  EMS reports initial blood pressure was approximately 95/30.  Patient was given 500 cc of normal saline during transport.  The history is provided by the patient, a relative, the EMS personnel and medical records.       Home Medications Prior to Admission medications   Medication Sig Start Date End Date Taking? Authorizing Provider  acetaminophen (TYLENOL) 325 MG tablet Take 650 mg by mouth every 6 (six) hours as needed for headache (or pain). Reported on 10/14/2015    [provider]  amLODipine (NORVASC) 5 MG tablet TAKE 1 TABLET BY MOUTH DAILY 02/23/22   Ronnald Nian, MD  furosemide (LASIX) 40 MG tablet Take 1 tablet by mouth daily. 09/26/22   Ronnald Nian, MD  hydrALAZINE (APRESOLINE) 10 MG tablet TAKE 1 TABLET BY MOUTH TWICE  DAILY 02/17/22   Ronnald Nian, MD  Multiple Vitamins-Minerals (PRESERVISION AREDS 2) CAPS Take 1 tablet by mouth 2 (two) times daily. 11/18/19   [provider]  mupirocin ointment (BACTROBAN) 2 % Place 1 application into the nose 3 (three) times daily. Patient not taking: Reported on 09/20/2021 04/02/20   Jac Canavan, PA-C  pravastatin (PRAVACHOL) 20 MG tablet TAKE 1 TABLET BY MOUTH AT  BEDTIME 02/17/22   Ronnald Nian, MD  valsartan-hydrochlorothiazide (DIOVAN-HCT) 160-12.5 MG tablet TAKE 1 TABLET BY MOUTH DAILY 11/13/22    Ronnald Nian, MD  XARELTO 15 MG TABS tablet TAKE 1 TABLET BY MOUTH DAILY  WITH SUPPER 02/17/22   Ronnald Nian, MD      Allergies    Tape    Review of Systems   Review of Systems  All other systems reviewed and are negative.   Physical Exam Updated Vital Signs BP (!) 127/37 (BP Location: Right Arm)   Pulse (!) 44   Temp 97.6 F (36.4 C) (Oral)   Resp 12   Ht 5\' 6"  (1.676 m)   Wt 68 kg   SpO2 96%   BMI 24.21 kg/m  Physical Exam Vitals and nursing note reviewed.  Constitutional:      General: She is not in acute distress.    Appearance: Normal appearance. She is well-developed.  HENT:     Head: Normocephalic and atraumatic.  Eyes:     Conjunctiva/sclera: Conjunctivae normal.     Pupils: Pupils are equal, round, and reactive to light.  Cardiovascular:     Rate and Rhythm: Normal rate and regular rhythm.     Heart sounds: Normal heart sounds.  Pulmonary:     Effort: Pulmonary effort is normal. No respiratory distress.     Breath sounds: Normal breath sounds.  Abdominal:     General: There is no distension.     Palpations: Abdomen is soft.     Tenderness: There is no abdominal tenderness.  Genitourinary:  Comments: Melanotic dark guaiac positive stool present on rectal exam Musculoskeletal:        General: No deformity. Normal range of motion.     Cervical back: Normal range of motion and neck supple.  Skin:    General: Skin is warm and dry.  Neurological:     General: No focal deficit present.     Mental Status: She is alert and oriented to person, place, and time.     ED Results / Procedures / Treatments   Labs (all labs ordered are listed, but only abnormal results are displayed) Labs Reviewed  POC OCCULT BLOOD, ED - Abnormal; Notable for the following components:      Result Value   Fecal Occult Bld POSITIVE (*)    All other components within normal limits  CBC WITH DIFFERENTIAL/PLATELET  COMPREHENSIVE METABOLIC PANEL  URINALYSIS, W/ REFLEX TO  CULTURE (INFECTION SUSPECTED)  I-STAT CHEM 8, ED  TYPE AND SCREEN  TROPONIN I (HIGH SENSITIVITY)    EKG None  Radiology No results found.  Procedures Procedures    Medications Ordered in ED Medications  pantoprazole (PROTONIX) injection 40 mg (has no administration in time range)    ED Course/ Medical Decision Making/ A&P                             Medical Decision Making Amount and/or Complexity of Data Reviewed Labs: ordered. Radiology: ordered.  Risk Prescription drug management. Decision regarding hospitalization.    Medical Screen Complete  This patient presented to the ED with complaint of GI bleed.  This complaint involves an extensive number of treatment options. The initial differential diagnosis includes, but is not limited to, GI bleeding, symptomatic anemia, metabolic abnormality, etc.  This presentation is: Acute, Chronic, Self-Limited, Previously Undiagnosed, Uncertain Prognosis, Complicated, Systemic Symptoms, and Threat to Life/Bodily Function  Patient presented to the ED with family reporting approximately 24 hours of melanotic stool.  Patient is on Xarelto with history of A-fib.  Last dose of Xarelto was given to the patient yesterday evening.  Patient with initial mild hypotension with EMS.  She was administered IV fluids during transport.  Initial hemoglobin is 4.3.  Protonix, blood transfusion initiated here in the ED.  GI is aware of case.  Hospitalist service is aware case will evaluate for admission.  Family is aware of plan of care.     Additional history obtained: External records from outside sources obtained and reviewed including prior ED visits and prior Inpatient records.    Lab Tests:  I ordered and personally interpreted labs.   Imaging Studies ordered:  I ordered imaging studies including chest x-ray I independently visualized and interpreted obtained imaging which showed NAD I agree with the radiologist  interpretation.   Cardiac Monitoring:  The patient was maintained on a cardiac monitor.  I personally viewed and interpreted the cardiac monitor which showed an underlying rhythm of: A-fib, heart rate of approximately 50   Medicines ordered:  I ordered medication including Protonix, PRBC transfusion for GI bleed Reevaluation of the patient after these medicines showed that the patient: improved    Problem List / ED Course:  GI bleed, AKI   Reevaluation:  After the interventions noted above, I reevaluated the patient and found that they have: improved   Disposition:  After consideration of the diagnostic results and the patients response to treatment, I feel that the patent would benefit from admission.   CRITICAL CARE Performed  by: Wynetta Fines   Total critical care time: 30 minutes  Critical care time was exclusive of separately billable procedures and treating other patients.  Critical care was necessary to treat or prevent imminent or life-threatening deterioration.  Critical care was time spent personally by me on the following activities: development of treatment plan with patient and/or surrogate as well as nursing, discussions with consultants, evaluation of patient's response to treatment, examination of patient, obtaining history from patient or surrogate, ordering and performing treatments and interventions, ordering and review of laboratory studies, ordering and review of radiographic studies, pulse oximetry and re-evaluation of patient's condition. rity         Final Clinical Impression(s) / ED Diagnoses Final diagnoses:  Lower GI bleed  AKI (acute kidney injury) [facility])    Rx / DC Orders ED Discharge Orders     None         Wynetta Fines, MD 12/30/22 [phone]

## 2022-12-30 NOTE — Consult Note (Signed)
Referring Provider: Dr. Kristine Royal Primary Care Physician:  Ronnald Nian, MD Primary Gastroenterologist: Gentry Fitz  Reason for Consultation: GI bleed/melena.  HPI: Tammy Lloyd is a 89 y.o. female with a past medical history of arthritis, hypertension, nonobstructive CAD, atrial fibrillation on Xarelto, CVA 07/2016, pulmonary hypertension, hemochromatosis meningioma and breast cancer. She presented to the ED due to having melena x 2 days.  Labs in the ED showed a WBC count of 7.8.  Hemoglobin 4.3.  Hematocrit 13.8.  MCV 106.2.  Platelet 282.  BUN 140 (BUN 35 on 09/01/2020). Creatinine 2.58.  Total bili less than 0.1.  Alk phos 46.  AST 14.  ALT 8.  Troponin 39.  Two units of PRBCs ordered, not yet transfused.  PPI infusion.  Her daughter/POA [name] is present along with her grandson.  Her daughter stated she noticed her mother's stools were getting darker about 3 days ago and she started passing black tarry stools x 3 episodes yesterday and x 2 today.  She passed 1 melenic stool since arriving to the ED.  No bright red rectal bleeding.  She is on Xarelto for history of A-fib and prior CVA.  Last dose of Xarelto was taken yesterday around 4:30 PM.  No NSAID use.  No heartburn, dysphagia or abdominal pain. No prior history of GI bleed.  She potentially had a colonoscopy many years ago, further details are unclear.  Never had an EGD.  No known family history of esophageal, gastric or colorectal cancer.   GI PROCEDURES:  Past Medical History:  Diagnosis Date   Arthritis    Cancer (HCC)    Breast   Cataract    Colon polyp    Edema of both legs    Hemochromatosis    Followed by  hematology;   Hemorrhoid    Hypertension    Idiopathic hemochromatosis 09/28/2011   Lichen sclerosus    Non-obstructive CAD (coronary artery disease)    a. 10/2011 Cath [facility]): LM nl, LAD 5/10ost, 10/15p, D1 nl, LCX 10/15ost, OM1/2 small, OM3 nl, RCA large/nl, RPDA/RPLV nl, EF 55-60%.   Osteoporosis 2005    DEXA 2015 T score -1.7 stable from prior DEXA 2011 history of Fosamax 8 years. On drug-free holiday now   Persistent atrial fibrillation (HCC)    a. CHA2DS2VASc = 5 -->Xarelto;  b. 07/2016 Echo: EF 55-60%, no rwma, mild MR, mild biatrial enlargement.   Pulmonary hypertension (HCC)    Stroke (HCC)    a. 07/2016 MRI: punctate acute ischemia in the R mid brain and possibly within the right pons w/o assoc hemorrhage or mass effect. Chronic microvascular ischemia.    Past Surgical History:  Procedure Laterality Date   BACK SURGERY  2008   BREAST LUMPECTOMY     Right   CATARACT EXTRACTION, BILATERAL     COLONOSCOPY  2009   DILATION AND CURETTAGE OF UTERUS  2005   HYSTEROSCOPY  08/2003   LEFT HEART CATHETERIZATION WITH CORONARY ANGIOGRAM N/A 10/24/2011   Procedure: LEFT HEART CATHETERIZATION WITH CORONARY ANGIOGRAM;  Surgeon: Robynn Pane, MD;  Location: [facility] CATH LAB;  Service: Cardiovascular;  Laterality: N/A;   OTHER SURGICAL HISTORY Left 10/02/2017   biopsy of left eyelid   TOTAL KNEE ARTHROPLASTY  2006    Prior to Admission medications   Medication Sig Start Date End Date Taking? Authorizing Provider  acetaminophen (TYLENOL) 325 MG tablet Take 650 mg by mouth every 6 (six) hours as needed for headache (or pain). Reported on 10/14/2015  [provider]  amLODipine (NORVASC) 5 MG tablet TAKE 1 TABLET BY MOUTH DAILY 02/23/22   Ronnald Nian, MD  furosemide (LASIX) 40 MG tablet Take 1 tablet by mouth daily. 09/26/22   Ronnald Nian, MD  hydrALAZINE (APRESOLINE) 10 MG tablet TAKE 1 TABLET BY MOUTH TWICE  DAILY 02/17/22   Ronnald Nian, MD  Multiple Vitamins-Minerals (PRESERVISION AREDS 2) CAPS Take 1 tablet by mouth 2 (two) times daily. 11/18/19   [provider]  mupirocin ointment (BACTROBAN) 2 % Place 1 application into the nose 3 (three) times daily. Patient not taking: Reported on 09/20/2021 04/02/20   Jac Canavan, PA-C  pravastatin (PRAVACHOL) 20 MG tablet  TAKE 1 TABLET BY MOUTH AT  BEDTIME 02/17/22   Ronnald Nian, MD  valsartan-hydrochlorothiazide (DIOVAN-HCT) 160-12.5 MG tablet TAKE 1 TABLET BY MOUTH DAILY 11/13/22   Ronnald Nian, MD  XARELTO 15 MG TABS tablet TAKE 1 TABLET BY MOUTH DAILY  WITH SUPPER 02/17/22   Ronnald Nian, MD    No current facility-administered medications for this encounter.   Current Outpatient Medications  Medication Sig Dispense Refill   acetaminophen (TYLENOL) 325 MG tablet Take 650 mg by mouth every 6 (six) hours as needed for headache (or pain). Reported on 10/14/2015     amLODipine (NORVASC) 5 MG tablet TAKE 1 TABLET BY MOUTH DAILY 90 tablet 3   furosemide (LASIX) 40 MG tablet Take 1 tablet by mouth daily. 90 tablet 1   hydrALAZINE (APRESOLINE) 10 MG tablet TAKE 1 TABLET BY MOUTH TWICE  DAILY 180 tablet 3   Multiple Vitamins-Minerals (PRESERVISION AREDS 2) CAPS Take 1 tablet by mouth 2 (two) times daily.     mupirocin ointment (BACTROBAN) 2 % Place 1 application into the nose 3 (three) times daily. (Patient not taking: Reported on 09/20/2021) 22 g 0   pravastatin (PRAVACHOL) 20 MG tablet TAKE 1 TABLET BY MOUTH AT  BEDTIME 90 tablet 3   valsartan-hydrochlorothiazide (DIOVAN-HCT) 160-12.5 MG tablet TAKE 1 TABLET BY MOUTH DAILY 90 tablet 3   XARELTO 15 MG TABS tablet TAKE 1 TABLET BY MOUTH DAILY  WITH SUPPER 90 tablet 3    Allergies as of 12/30/2022 - Review Complete 12/30/2022  Allergen Reaction Noted   Tape Other (See Comments) 07/20/2016    Family History  Problem Relation Age of Onset   Cancer Mother 29       GYN cancer (had hysterectomy)   Heart disease Mother        CHF   Cancer Father        liver   Asthma Sister    Breast cancer Sister 17   Heart disease Brother        rheumatic heart attack   Breast cancer Sister 91   Asthma Brother    Cancer Brother        Prostate   Cancer Brother        Prostate    Social History   Socioeconomic History   Marital status: Widowed    Spouse name:  Not on file   Number of children: Not on file   Years of education: Not on file   Highest education level: Not on file  Occupational History   Not on file  Tobacco Use   Smoking status: Never   Smokeless tobacco: Never  Vaping Use   Vaping Use: Never used  Substance and Sexual Activity   Alcohol use: No    Alcohol/week: 0.0 standard drinks of  alcohol   Drug use: No   Sexual activity: Not Currently    Birth control/protection: Post-menopausal    Comment: 1st intercourse 89 yo-Fewer than 5 partners  Other Topics Concern   Not on file  Social History Narrative   09/12/17 Lives alone. No pets   Social Determinants of Health   Financial Resource Strain: Low Risk  (06/09/2022)   Overall Financial Resource Strain (CARDIA)    Difficulty of Paying Living Expenses: Not hard at all  Food Insecurity: No Food Insecurity (06/09/2022)   Hunger Vital Sign    Worried About Running Out of Food in the Last Year: Never true    Ran Out of Food in the Last Year: Never true  Transportation Needs: No Transportation Needs (06/09/2022)   PRAPARE - Administrator, Civil Service (Medical): No    Lack of Transportation (Non-Medical): No  Physical Activity: Inactive (06/09/2022)   Exercise Vital Sign    Days of Exercise per Week: 0 days    Minutes of Exercise per Session: 0 min  Stress: No Stress Concern Present (06/09/2022)   [employer] of Occupational Health - Occupational Stress Questionnaire    Feeling of Stress : Not at all  Social Connections: Not on file  Intimate Partner Violence: Not on file    Review of Systems: Gen: Denies fever, sweats or chills. No weight loss.  CV: Denies chest pain, palpitations or edema. Resp: Denies cough, shortness of breath of hemoptysis.  GI: See HPI. GU : Denies urinary burning, blood in urine, increased urinary frequency or incontinence. MS: Denies joint pain, muscles aches or weakness. Derm: Denies rash, itchiness, skin lesions or unhealing  ulcers. Psych: Denies depression, anxiety, memory loss or confusion. Heme: Denies easy bruising, bleeding. Neuro: Past CVA. Endo:  Denies any problems with DM, thyroid or adrenal function.  Physical Exam: Vital signs in last 24 hours: Temp:  [97.6 F (36.4 C)] 97.6 F (36.4 C) (06/22 1326) Pulse Rate:  [44] 44 (06/22 1326) Resp:  [12] 12 (06/22 1326) BP: (127)/(37) 127/37 (06/22 1326) SpO2:  [96 %] 96 % (06/22 1326) Weight:  [68 kg] 68 kg (06/22 1326)   General: Frail-appearing 89 year old female. Head:  Normocephalic and atraumatic. Eyes:  No scleral icterus. Conjunctiva pink. Ears:  Normal auditory acuity. Nose:  No deformity, discharge or lesions. Mouth:  Dentition intact. No ulcers or lesions.  Neck:  Supple. No lymphadenopathy or thyromegaly.  Lungs: Breath sounds clear throughout. No wheezes, rhonchi or crackles.  Heart: Irregular rhythm, bradycardic.  No murmur. Abdomen: Soft, nondistended.  Nontender.  Positive bowel sounds to all 4 quadrants.  No bruit.  No palpable mass. Rectal: Deferred. Musculoskeletal:  Symmetrical without gross deformities.  Pulses:  Normal pulses noted. Extremities:  Without clubbing or edema. Neurologic:  Alert and  oriented x 4. No focal deficits.  Skin:  Intact without significant lesions or rashes. Psych:  Alert and cooperative. Normal mood and affect.  Intake/Output from previous day: No intake/output data recorded. Intake/Output this shift: No intake/output data recorded.  Lab Results: Recent Labs    12/30/22 1342  WBC 7.8  HGB 4.3*  HCT 13.8*  PLT 262   BMET Recent Labs    12/30/22 1342  NA 135  K 4.5  CL 104  CO2 18*  GLUCOSE 159*  BUN 140*  CREATININE 2.58*  CALCIUM 7.9*   LFT Recent Labs    12/30/22 1342  PROT 5.0*  ALBUMIN 3.1*  AST 14*  ALT 8  ALKPHOS  46  BILITOT <0.1*   PT/INR No results for input(s): "LABPROT", "INR" in the last 72 hours. Hepatitis Panel No results for input(s): "HEPBSAG",  "HCVAB", "HEPAIGM", "HEPBIGM" in the last 72 hours.    Studies/Results: DG Chest Port 1 View  Result Date: 12/30/2022 CLINICAL DATA:  Weakness. EXAM: PORTABLE CHEST 1 VIEW COMPARISON:  October 06, 2013. FINDINGS: Stable cardiomegaly. Minimal bibasilar subsegmental atelectasis or scarring is noted. Severe degenerative changes seen involving both glenohumeral joints. IMPRESSION: Minimal bibasilar subsegmental atelectasis or scarring. Electronically Signed   By: Lupita Raider M.D.   On: 12/30/2022 14:24    IMPRESSION/PLAN:  89 year old female admitted with melenic stools x 2 days and profound anemia.  On Xarelto, last dose was 4:30pm on 6/21. Hemoglobin 4.3 down (Hg 11.2 on 09/01/2020).  Two units of PRBCs ordered, not yet transfused. -Patient will be admitted to the ICU -H&H posttransfusion every 6 hours x 24 hours -Transfuse to maintain hemoglobin level greater than 8 -Continue PPI infusion -NPO -Diagnostic EGD discussed with the patient's daughter and grandson, family wishes to discuss this further prior to pursuing any invasive endoscopic procedure -Further recommendations per Dr. Leonides Schanz  Atrial fibrillation on Xarelto.  Last dose of Xarelto was taken at 430 on 6/21.  Echo 04/19/2020 showed LVEF 55 to 60%. -Hold Xarelto  History of CVA 2018  History of nonobstructive CAD     Arnaldo Natal  12/30/2022, 3:54PM

## 2022-12-30 NOTE — H&P (Addendum)
History and Physical   Tammy Lloyd QMV:[id number] DOB: 07/11/1923 DOA: 12/30/2022  PCP: Ronnald Nian, MD   Patient coming from: Home  Chief Complaint: Rectal bleeding  HPI: Tammy Lloyd is a 89 y.o. female with medical history significant of stroke, hypertension, nonobstructive CAD, atrial fibrillation with history of SVR, meningioma, macular degeneration, breast cancer presenting with rectal bleeding.  Patient has had some degree of rectal bleeding for the past 2 days.  Initially the patient family thought that it may have been just diarrhea with unusual color.  However had increased frequency and severity last night.  EMS reports dark tarry stools.  EMS noted blood pressure initially in the 90s systolic but this improved to the 110s systolic with 500 cc of IV fluids.  Patient is on Xarelto for A-fib.  Patient denies fevers, chills, chest pain, abdominal pain, constipation, nausea, vomiting.  ED Course: Vital signs in the ED notable for blood pressure in the 100s to 120 systolic, heart rate in the 40s.  Lab workup included CMP with bicarb 18, creatinine elevated to 2.58 from baseline 1.3, glucose 159, calcium 7.9, protein 5.3, albumin 3.1.  CBC with hemoglobin of 4.3 down from 11 2 years ago.  FOBT positive.  Troponin 39 with.  Urinalysis pending.  Chest x-ray with minimal atelectasis versus scarring.  Patient received IV PPI x 2 and 2 units of PRBCs have been ordered.  GI consulted and are seeing the patient.  Review of Systems: As per HPI otherwise all other systems reviewed and are negative.  Past Medical History:  Diagnosis Date   Arthritis    Cancer (HCC)    Breast   Cataract    Colon polyp    Edema of both legs    Hemochromatosis    Followed by  hematology;   Hemorrhoid    Hypertension    Idiopathic hemochromatosis 09/28/2011   Lichen sclerosus    Non-obstructive CAD (coronary artery disease)    a. 10/2011 Cath [facility]): LM nl, LAD 5/10ost, 10/15p, D1 nl, LCX 10/15ost,  OM1/2 small, OM3 nl, RCA large/nl, RPDA/RPLV nl, EF 55-60%.   Osteoporosis 2005   DEXA 2015 T score -1.7 stable from prior DEXA 2011 history of Fosamax 8 years. On drug-free holiday now   Persistent atrial fibrillation (HCC)    a. CHA2DS2VASc = 5 -->Xarelto;  b. 07/2016 Echo: EF 55-60%, no rwma, mild MR, mild biatrial enlargement.   Pulmonary hypertension (HCC)    Stroke (HCC)    a. 07/2016 MRI: punctate acute ischemia in the R mid brain and possibly within the right pons w/o assoc hemorrhage or mass effect. Chronic microvascular ischemia.    Past Surgical History:  Procedure Laterality Date   BACK SURGERY  2008   BREAST LUMPECTOMY     Right   CATARACT EXTRACTION, BILATERAL     COLONOSCOPY  2009   DILATION AND CURETTAGE OF UTERUS  2005   HYSTEROSCOPY  08/2003   LEFT HEART CATHETERIZATION WITH CORONARY ANGIOGRAM N/A 10/24/2011   Procedure: LEFT HEART CATHETERIZATION WITH CORONARY ANGIOGRAM;  Surgeon: Robynn Pane, MD;  Location: [facility] CATH LAB;  Service: Cardiovascular;  Laterality: N/A;   OTHER SURGICAL HISTORY Left 10/02/2017   biopsy of left eyelid   TOTAL KNEE ARTHROPLASTY  2006   Social History  reports that she has never smoked. She has never used smokeless tobacco. She reports that she does not drink alcohol and does not use drugs.  Allergies  Allergen Reactions   Tape Other (See  Comments)    Skin is thin and may tear or bruise easily    Family History  Problem Relation Age of Onset   Cancer Mother 76       GYN cancer (had hysterectomy)   Heart disease Mother        CHF   Cancer Father        liver   Asthma Sister    Breast cancer Sister 51   Heart disease Brother        rheumatic heart attack   Breast cancer Sister 61   Asthma Brother    Cancer Brother        Prostate   Cancer Brother        Prostate  Reviewed on admission  Prior to Admission medications   Medication Sig Start Date End Date Taking? Authorizing Provider  acetaminophen (TYLENOL) 325 MG tablet  Take 650 mg by mouth every 6 (six) hours as needed for headache (or pain). Reported on 10/14/2015    [provider]  amLODipine (NORVASC) 5 MG tablet TAKE 1 TABLET BY MOUTH DAILY 02/23/22   Ronnald Nian, MD  furosemide (LASIX) 40 MG tablet Take 1 tablet by mouth daily. 09/26/22   Ronnald Nian, MD  hydrALAZINE (APRESOLINE) 10 MG tablet TAKE 1 TABLET BY MOUTH TWICE  DAILY 02/17/22   Ronnald Nian, MD  Multiple Vitamins-Minerals (PRESERVISION AREDS 2) CAPS Take 1 tablet by mouth 2 (two) times daily. 11/18/19   [provider]  mupirocin ointment (BACTROBAN) 2 % Place 1 application into the nose 3 (three) times daily. Patient not taking: Reported on 09/20/2021 04/02/20   Jac Canavan, PA-C  pravastatin (PRAVACHOL) 20 MG tablet TAKE 1 TABLET BY MOUTH AT  BEDTIME 02/17/22   Ronnald Nian, MD  valsartan-hydrochlorothiazide (DIOVAN-HCT) 160-12.5 MG tablet TAKE 1 TABLET BY MOUTH DAILY 11/13/22   Ronnald Nian, MD  XARELTO 15 MG TABS tablet TAKE 1 TABLET BY MOUTH DAILY  WITH SUPPER 02/17/22   Ronnald Nian, MD    Physical Exam: Vitals:   12/30/22 1326  BP: (!) 127/37  Pulse: (!) 44  Resp: 12  Temp: 97.6 F (36.4 C)  TempSrc: Oral  SpO2: 96%  Weight: 68 kg  Height: 5\' 6"  (1.676 m)    Physical Exam Constitutional:      General: She is not in acute distress.    Appearance: Normal appearance.     Comments: Elderly female, hard of hearing, somewhat confused (but could be from hearing issues)  HENT:     Head: Normocephalic and atraumatic.     Mouth/Throat:     Mouth: Mucous membranes are moist.     Pharynx: Oropharynx is clear.  Eyes:     Extraocular Movements: Extraocular movements intact.     Pupils: Pupils are equal, round, and reactive to light.  Cardiovascular:     Rate and Rhythm: Bradycardia present. Rhythm irregular.     Pulses: Normal pulses.     Heart sounds: Normal heart sounds.  Pulmonary:     Effort: Pulmonary effort is normal. No respiratory  distress.     Breath sounds: Normal breath sounds.  Abdominal:     General: Bowel sounds are normal. There is no distension.     Palpations: Abdomen is soft.     Tenderness: There is no abdominal tenderness.  Musculoskeletal:        General: No swelling or deformity.  Skin:    General: Skin is warm and dry.  Coloration: Skin is pale.  Neurological:     General: No focal deficit present.     Mental Status: Mental status is at baseline.    Labs on Admission: I have personally reviewed following labs and imaging studies  CBC: Recent Labs  Lab 12/30/22 1342  WBC 7.8  NEUTROABS 6.4  HGB 4.3*  HCT 13.8*  MCV 106.2*  PLT 262    Basic Metabolic Panel: Recent Labs  Lab 12/30/22 1342  NA 135  K 4.5  CL 104  CO2 18*  GLUCOSE 159*  BUN 140*  CREATININE 2.58*  CALCIUM 7.9*    GFR: Estimated Creatinine Clearance: 11.9 mL/min (A) (by C-G formula based on SCr of 2.58 mg/dL (H)).  Liver Function Tests: Recent Labs  Lab 12/30/22 1342  AST 14*  ALT 8  ALKPHOS 46  BILITOT <0.1*  PROT 5.0*  ALBUMIN 3.1*    Urine analysis:    Component Value Date/Time   COLORURINE YELLOW 07/20/2016 2125   APPEARANCEUR CLEAR 07/20/2016 2125   LABSPEC 1.023 07/20/2016 2125   PHURINE 7.0 07/20/2016 2125   GLUCOSEU NEGATIVE 07/20/2016 2125   HGBUR NEGATIVE 07/20/2016 2125   BILIRUBINUR NEGATIVE 07/20/2016 2125   KETONESUR NEGATIVE 07/20/2016 2125   PROTEINUR 30 (A) 07/20/2016 2125   UROBILINOGEN 0.2 05/07/2014 1213   NITRITE NEGATIVE 07/20/2016 2125   LEUKOCYTESUR MODERATE (A) 07/20/2016 2125    Radiological Exams on Admission: DG Chest Port 1 View  Result Date: 12/30/2022 CLINICAL DATA:  Weakness. EXAM: PORTABLE CHEST 1 VIEW COMPARISON:  October 06, 2013. FINDINGS: Stable cardiomegaly. Minimal bibasilar subsegmental atelectasis or scarring is noted. Severe degenerative changes seen involving both glenohumeral joints. IMPRESSION: Minimal bibasilar subsegmental atelectasis or  scarring. Electronically Signed   By: Lupita Raider M.D.   On: 12/30/2022 14:24    EKG: Independently reviewed.  Atrial fibrillation with SVR at 46 bpm.  Right bundle branch block.  Nonspecific T wave changes.  Assessment/Plan Principal Problem:   Acute GI bleeding Active Problems:   Hypertension   History of breast cancer   CAD (coronary artery disease)   Atrial fibrillation (HCC)   History of CVA (cerebrovascular accident)   Macular degeneration   Acute GI bleed Anemia > Patient presenting with at least 2 days of rectal bleeding.  Dark tarry stools.  Blood pressure initially 90s but improved to 100s to 120s after 500 cc by EMS. > Hemoglobin found to be 4.3, last CBC was 2 years ago with hemoglobin of 11.  Is microcytic. > 2 units ordered for transfusion in the ED.  Patient also received 2 doses of IV PPI.  GI consulted. - Monitor in progressive unit - Appreciate GI recommendations - Continue with 2 units PRBC transfusion - Check folate and B12 - Trend CBC  AKI > Cr 2.58 from baseline of 1.3.  Could be decreased p.o. intake versus diarrhea versus decrease effective arterial volume. > Received a liter of fluids in the ED. - Will monitor response to IV fluids and transfusion(s) - Continue to trend renal function and electrolytes  Hypertension - Holding home amlodipine, hydralazine, valsartan-hydrochlorothiazide, Lasix in the setting of low blood pressure/GI bleed as above.  History of CVA Nonobstructive CAD Hyperlipidemia - Continue home pravastatin - Hold home Xarelto in the setting of GI bleed  Atrial fibrillation > With SVR in the 40s chronically. - Holding home Xarelto as above  History of breast cancer Meningioma Macular degeneration - Noted  DVT prophylaxis: SCDs Code Status:   DNR, trial of  intubation okay Family Communication:  Updated at bedside  Disposition Plan:   Patient is from:  Home  Anticipated DC to:  Home  Anticipated DC date:  1 to 3  days  Anticipated DC barriers: None  Consults called:  Gastroenterology Admission status:  Observation, progressive  Severity of Illness: The appropriate patient status for this patient is OBSERVATION. Observation status is judged to be reasonable and necessary in order to provide the required intensity of service to ensure the patient's safety. The patient's presenting symptoms, physical exam findings, and initial radiographic and laboratory data in the context of their medical condition is felt to place them at decreased risk for further clinical deterioration. Furthermore, it is anticipated that the patient will be medically stable for discharge from the hospital within 2 midnights of admission.    Synetta Fail MD [facility]  How to contact the [facility] Attending or Consulting provider 7A - 7P or covering provider during after hours 7P -7A, for this patient?   Check the care team in [facility] and look for a) attending/consulting [facility] provider listed and b) the [facility] team listed Log into [email or web address] and use [place]'s universal password to access. If you do not have the password, please contact the hospital operator. Locate the [facility] provider you are looking for under [facility] and page to a number that you can be directly reached. If you still have difficulty reaching the provider, please page the [facility] (Director on Call) for the Hospitalists listed on amion for assistance.  12/30/2022, 3:09 PM

## 2022-12-31 ENCOUNTER — Other Ambulatory Visit: Payer: Self-pay | Admitting: Family Medicine

## 2022-12-31 DIAGNOSIS — R609 Edema, unspecified: Secondary | ICD-10-CM | POA: Diagnosis not present

## 2022-12-31 DIAGNOSIS — K921 Melena: Secondary | ICD-10-CM | POA: Diagnosis present

## 2022-12-31 DIAGNOSIS — Z8673 Personal history of transient ischemic attack (TIA), and cerebral infarction without residual deficits: Secondary | ICD-10-CM

## 2022-12-31 DIAGNOSIS — D649 Anemia, unspecified: Secondary | ICD-10-CM | POA: Diagnosis not present

## 2022-12-31 DIAGNOSIS — I10 Essential (primary) hypertension: Secondary | ICD-10-CM

## 2022-12-31 DIAGNOSIS — K920 Hematemesis: Secondary | ICD-10-CM | POA: Diagnosis not present

## 2022-12-31 DIAGNOSIS — I4891 Unspecified atrial fibrillation: Secondary | ICD-10-CM | POA: Diagnosis not present

## 2022-12-31 DIAGNOSIS — Z96659 Presence of unspecified artificial knee joint: Secondary | ICD-10-CM | POA: Diagnosis present

## 2022-12-31 DIAGNOSIS — R262 Difficulty in walking, not elsewhere classified: Secondary | ICD-10-CM | POA: Diagnosis not present

## 2022-12-31 DIAGNOSIS — K922 Gastrointestinal hemorrhage, unspecified: Secondary | ICD-10-CM | POA: Insufficient documentation

## 2022-12-31 DIAGNOSIS — I272 Pulmonary hypertension, unspecified: Secondary | ICD-10-CM | POA: Diagnosis present

## 2022-12-31 DIAGNOSIS — K4090 Unilateral inguinal hernia, without obstruction or gangrene, not specified as recurrent: Secondary | ICD-10-CM | POA: Diagnosis not present

## 2022-12-31 DIAGNOSIS — R58 Hemorrhage, not elsewhere classified: Secondary | ICD-10-CM | POA: Diagnosis not present

## 2022-12-31 DIAGNOSIS — E785 Hyperlipidemia, unspecified: Secondary | ICD-10-CM

## 2022-12-31 DIAGNOSIS — Z86011 Personal history of benign neoplasm of the brain: Secondary | ICD-10-CM | POA: Diagnosis not present

## 2022-12-31 DIAGNOSIS — Z825 Family history of asthma and other chronic lower respiratory diseases: Secondary | ICD-10-CM | POA: Diagnosis not present

## 2022-12-31 DIAGNOSIS — I4819 Other persistent atrial fibrillation: Secondary | ICD-10-CM | POA: Diagnosis present

## 2022-12-31 DIAGNOSIS — Z9842 Cataract extraction status, left eye: Secondary | ICD-10-CM | POA: Diagnosis not present

## 2022-12-31 DIAGNOSIS — K6289 Other specified diseases of anus and rectum: Secondary | ICD-10-CM | POA: Diagnosis present

## 2022-12-31 DIAGNOSIS — L8990 Pressure ulcer of unspecified site, unspecified stage: Secondary | ICD-10-CM | POA: Diagnosis present

## 2022-12-31 DIAGNOSIS — Z7401 Bed confinement status: Secondary | ICD-10-CM | POA: Diagnosis not present

## 2022-12-31 DIAGNOSIS — R197 Diarrhea, unspecified: Secondary | ICD-10-CM | POA: Diagnosis not present

## 2022-12-31 DIAGNOSIS — Z7901 Long term (current) use of anticoagulants: Secondary | ICD-10-CM | POA: Diagnosis not present

## 2022-12-31 DIAGNOSIS — N189 Chronic kidney disease, unspecified: Secondary | ICD-10-CM | POA: Diagnosis not present

## 2022-12-31 DIAGNOSIS — Z86010 Personal history of colonic polyps: Secondary | ICD-10-CM | POA: Diagnosis not present

## 2022-12-31 DIAGNOSIS — I129 Hypertensive chronic kidney disease with stage 1 through stage 4 chronic kidney disease, or unspecified chronic kidney disease: Secondary | ICD-10-CM | POA: Diagnosis present

## 2022-12-31 DIAGNOSIS — Z66 Do not resuscitate: Secondary | ICD-10-CM | POA: Diagnosis present

## 2022-12-31 DIAGNOSIS — Z9841 Cataract extraction status, right eye: Secondary | ICD-10-CM | POA: Diagnosis not present

## 2022-12-31 DIAGNOSIS — N179 Acute kidney failure, unspecified: Secondary | ICD-10-CM | POA: Diagnosis present

## 2022-12-31 DIAGNOSIS — N1831 Chronic kidney disease, stage 3a: Secondary | ICD-10-CM | POA: Diagnosis present

## 2022-12-31 DIAGNOSIS — H3530 Unspecified macular degeneration: Secondary | ICD-10-CM | POA: Diagnosis present

## 2022-12-31 DIAGNOSIS — R1111 Vomiting without nausea: Secondary | ICD-10-CM | POA: Diagnosis not present

## 2022-12-31 DIAGNOSIS — Z853 Personal history of malignant neoplasm of breast: Secondary | ICD-10-CM | POA: Diagnosis not present

## 2022-12-31 DIAGNOSIS — R195 Other fecal abnormalities: Secondary | ICD-10-CM | POA: Diagnosis not present

## 2022-12-31 DIAGNOSIS — I2510 Atherosclerotic heart disease of native coronary artery without angina pectoris: Secondary | ICD-10-CM | POA: Diagnosis present

## 2022-12-31 DIAGNOSIS — D62 Acute posthemorrhagic anemia: Secondary | ICD-10-CM | POA: Diagnosis present

## 2022-12-31 DIAGNOSIS — M810 Age-related osteoporosis without current pathological fracture: Secondary | ICD-10-CM | POA: Diagnosis present

## 2022-12-31 DIAGNOSIS — L89152 Pressure ulcer of sacral region, stage 2: Secondary | ICD-10-CM | POA: Diagnosis present

## 2022-12-31 LAB — URINALYSIS, W/ REFLEX TO CULTURE (INFECTION SUSPECTED)
Bacteria, UA: NONE SEEN
Bilirubin Urine: NEGATIVE
Glucose, UA: NEGATIVE mg/dL
Hgb urine dipstick: NEGATIVE
Ketones, ur: NEGATIVE mg/dL
Nitrite: NEGATIVE
Protein, ur: NEGATIVE mg/dL
Specific Gravity, Urine: 1.011 (ref 1.005–1.030)
pH: 5 (ref 5.0–8.0)

## 2022-12-31 LAB — CBC WITH DIFFERENTIAL/PLATELET
Abs Immature Granulocytes: 0.04 10*3/uL (ref 0.00–0.07)
Basophils Absolute: 0.1 10*3/uL (ref 0.0–0.1)
Basophils Relative: 1 %
Eosinophils Absolute: 0.1 10*3/uL (ref 0.0–0.5)
Eosinophils Relative: 1 %
HCT: 22.3 % — ABNORMAL LOW (ref 36.0–46.0)
Hemoglobin: 7.2 g/dL — ABNORMAL LOW (ref 12.0–15.0)
Immature Granulocytes: 1 %
Lymphocytes Relative: 23 %
Lymphs Abs: 2 10*3/uL (ref 0.7–4.0)
MCH: 28.9 pg (ref 26.0–34.0)
MCHC: 32.3 g/dL (ref 30.0–36.0)
MCV: 89.6 fL (ref 80.0–100.0)
Monocytes Absolute: 1 10*3/uL (ref 0.1–1.0)
Monocytes Relative: 12 %
Neutro Abs: 5.4 10*3/uL (ref 1.7–7.7)
Neutrophils Relative %: 62 %
Platelets: 229 10*3/uL (ref 150–400)
RBC: 2.49 MIL/uL — ABNORMAL LOW (ref 3.87–5.11)
RDW: 22.9 % — ABNORMAL HIGH (ref 11.5–15.5)
WBC: 8.5 10*3/uL (ref 4.0–10.5)
nRBC: 0 % (ref 0.0–0.2)

## 2022-12-31 LAB — CBC
HCT: 20 % — ABNORMAL LOW (ref 36.0–46.0)
HCT: 29.3 % — ABNORMAL LOW (ref 36.0–46.0)
Hemoglobin: 6.7 g/dL — CL (ref 12.0–15.0)
Hemoglobin: 9.7 g/dL — ABNORMAL LOW (ref 12.0–15.0)
MCH: 29.1 pg (ref 26.0–34.0)
MCH: 29.5 pg (ref 26.0–34.0)
MCHC: 33.5 g/dL (ref 30.0–36.0)
MCHC: 33.1 g/dL (ref 30.0–36.0)
MCV: 87 fL (ref 80.0–100.0)
MCV: 89.1 fL (ref 80.0–100.0)
Platelets: 209 10*3/uL (ref 150–400)
Platelets: 204 10*3/uL (ref 150–400)
RBC: 2.3 MIL/uL — ABNORMAL LOW (ref 3.87–5.11)
RBC: 3.29 MIL/uL — ABNORMAL LOW (ref 3.87–5.11)
RDW: 23.4 % — ABNORMAL HIGH (ref 11.5–15.5)
RDW: 19.5 % — ABNORMAL HIGH (ref 11.5–15.5)
WBC: 8.6 10*3/uL (ref 4.0–10.5)
WBC: 9.8 10*3/uL (ref 4.0–10.5)
nRBC: 0 % (ref 0.0–0.2)
nRBC: 0 % (ref 0.0–0.2)

## 2022-12-31 LAB — BPAM RBC
Blood Product Expiration Date: 202407202359
Blood Product Expiration Date: 202407202359
Unit Type and Rh: 5100

## 2022-12-31 LAB — COMPREHENSIVE METABOLIC PANEL
ALT: 10 U/L (ref 0–44)
AST: 15 U/L (ref 15–41)
Albumin: 3.1 g/dL — ABNORMAL LOW (ref 3.5–5.0)
Alkaline Phosphatase: 47 U/L (ref 38–126)
Anion gap: 12 (ref 5–15)
BUN: 135 mg/dL — ABNORMAL HIGH (ref 8–23)
CO2: 18 mmol/L — ABNORMAL LOW (ref 22–32)
Calcium: 8.1 mg/dL — ABNORMAL LOW (ref 8.9–10.3)
Chloride: 106 mmol/L (ref 98–111)
Creatinine, Ser: 2.35 mg/dL — ABNORMAL HIGH (ref 0.44–1.00)
GFR, Estimated: 18 mL/min — ABNORMAL LOW (ref >60)
Glucose, Bld: 116 mg/dL — ABNORMAL HIGH (ref 70–99)
Potassium: 4.2 mmol/L (ref 3.5–5.1)
Sodium: 136 mmol/L (ref 135–145)
Total Bilirubin: 0.9 mg/dL (ref 0.3–1.2)
Total Protein: 5.3 g/dL — ABNORMAL LOW (ref 6.5–8.1)

## 2022-12-31 LAB — TYPE AND SCREEN

## 2022-12-31 LAB — PREPARE RBC (CROSSMATCH)

## 2022-12-31 MED ORDER — FUROSEMIDE 10 MG/ML IJ SOLN
40.0000 mg | Freq: Once | INTRAMUSCULAR | Status: AC
  Administered 2022-12-31: 40 mg via INTRAVENOUS
  Filled 2022-12-31: qty 4

## 2022-12-31 MED ORDER — DEXTROSE 5 % IV SOLN: INTRAVENOUS | Status: AC

## 2022-12-31 MED ORDER — SODIUM CHLORIDE 0.9% IV SOLUTION: Freq: Once | INTRAVENOUS | Status: AC

## 2022-12-31 MED ORDER — HYDRALAZINE HCL 20 MG/ML IJ SOLN: 10.0000 mg | Freq: Four times a day (QID) | INTRAMUSCULAR | Status: DC | PRN

## 2022-12-31 NOTE — Progress Notes (Signed)
    Gastroenterology Inpatient Follow Up    Subjective: Continuing to have melena.  Getting 2 units of PRBCs today  Objective: Vital signs in last 24 hours: Temp:  [96.6 F (35.9 C)-97.8 F (36.6 C)] 97.8 F (36.6 C) (06/23 1430) Pulse Rate:  [43-57] 53 (06/23 1430) Resp:  [11-28] 16 (06/23 1430) BP: (104-150)/(36-98) 145/47 (06/23 1430) SpO2:  [91 %-100 %] 97 % (06/23 1430) Last BM Date : 12/31/22  Intake/Output from previous day: 06/22 0701 - 06/23 0700 In: 776.8 [I.V.:79.3; Blood:697.5] Out: 151 [Urine:150; Stool:1] Intake/Output this shift: Total I/O In: 315 [Blood:315] Out: -   General appearance: alert and cooperative Resp: no increased WOB Cardio: bradycardic GI: non-tender, non-distended  Lab Results: Recent Labs    12/30/22 1342 12/31/22 0033 12/31/22 0815  WBC 7.8 8.5 8.6  HGB 4.3* 7.2* 6.7*  HCT 13.8* 22.3* 20.0*  PLT 262 229 209   BMET Recent Labs    12/30/22 1342 12/31/22 0033  NA 135 136  K 4.5 4.2  CL 104 106  CO2 18* 18*  GLUCOSE 159* 116*  BUN 140* 135*  CREATININE 2.58* 2.35*  CALCIUM 7.9* 8.1*   LFT Recent Labs    12/31/22 0033  PROT 5.3*  ALBUMIN 3.1*  AST 15  ALT 10  ALKPHOS 47  BILITOT 0.9   PT/INR No results for input(s): "LABPROT", "INR" in the last 72 hours. Hepatitis Panel No results for input(s): "HEPBSAG", "HCVAB", "HEPAIGM", "HEPBIGM" in the last 72 hours. C-Diff No results for input(s): "CDIFFTOX" in the last 72 hours.  Studies/Results: DG Chest Port 1 View  Result Date: 12/30/2022 CLINICAL DATA:  Weakness. EXAM: PORTABLE CHEST 1 VIEW COMPARISON:  October 06, 2013. FINDINGS: Stable cardiomegaly. Minimal bibasilar subsegmental atelectasis or scarring is noted. Severe degenerative changes seen involving both glenohumeral joints. IMPRESSION: Minimal bibasilar subsegmental atelectasis or scarring. Electronically Signed   By: Lupita Raider M.D.   On: 12/30/2022 14:24    Medications: I have reviewed the  patient's current medications. Scheduled:  [START ON 01/03/2023] pantoprazole  40 mg Intravenous Q12H   pravastatin  20 mg Oral QHS   sodium chloride flush  3 mL Intravenous Q12H   Continuous:  dextrose 75 mL/hr at 12/31/22 0741   pantoprazole 8 mg/hr (12/31/22 0612)   WUJ:[id number] **OR** acetaminophen, hydrALAZINE  Assessment/Plan: 89 year old female with history of atrial fibrillation on Xarelto, CVA, pulm HTN, CAD, hemochromatosis, and breast cancer presented with melena. Hb was 4.3 upon arrival, decreased from 11.2 two years ago.  She got 2 units of PRBCs yesterday with improvement of her hemoglobin of 7.2, which is now decreased again to 6.7.  She is getting 2 units of PRBCs today.  Last dose of Xarelto was 6/21. Patient has never had an EGD before. It is suspected that the patient is currently having an upper GI bleed.  Due to ongoing GI bleeding, the patient and her family (daughter and grandson) are now agreeing to the EGD procedure. - Continue to trend Hb.  Transfuse PRBCs as needed. - Continue PPI therapy - Keep NPO for now - Plan for EGD tomorrow.  Patient's daughter typically consents for the patient.   LOS: 0 days   Imogene Burn 12/31/2022, 4:39 PM

## 2022-12-31 NOTE — Progress Notes (Addendum)
PT Cancellation Note  Patient Details Name: Tammy Lloyd MRN: [id number] DOB: Apr 23, 1924   Cancelled Treatment:    Reason Eval/Treat Not Completed: (P) Patient not medically ready. RN reporting pt is not hemodynamically stable at this time with plans for pt to receive blood soon, asking therapy to hold currently. Will plan to follow-up later if time permits.   Addendum 16:05 - RN requesting therapy continue to hold off at this time due to extent of blood in stool. Will plan to follow-up another day as able.   Raymond Gurney, PT, DPT Acute Rehabilitation Services  Office: [phone]    Jewel Baize 12/31/2022, 9:33 AM

## 2022-12-31 NOTE — H&P (View-Only) (Signed)
    Gastroenterology Inpatient Follow Up    Subjective: Continuing to have melena.  Getting 2 units of PRBCs today  Objective: Vital signs in last 24 hours: Temp:  [96.6 F (35.9 C)-97.8 F (36.6 C)] 97.8 F (36.6 C) (06/23 1430) Pulse Rate:  [43-57] 53 (06/23 1430) Resp:  [11-28] 16 (06/23 1430) BP: (104-150)/(36-98) 145/47 (06/23 1430) SpO2:  [91 %-100 %] 97 % (06/23 1430) Last BM Date : 12/31/22  Intake/Output from previous day: 06/22 0701 - 06/23 0700 In: 776.8 [I.V.:79.3; Blood:697.5] Out: 151 [Urine:150; Stool:1] Intake/Output this shift: Total I/O In: 315 [Blood:315] Out: -   General appearance: alert and cooperative Resp: no increased WOB Cardio: bradycardic GI: non-tender, non-distended  Lab Results: Recent Labs    12/30/22 1342 12/31/22 0033 12/31/22 0815  WBC 7.8 8.5 8.6  HGB 4.3* 7.2* 6.7*  HCT 13.8* 22.3* 20.0*  PLT 262 229 209   BMET Recent Labs    12/30/22 1342 12/31/22 0033  NA 135 136  K 4.5 4.2  CL 104 106  CO2 18* 18*  GLUCOSE 159* 116*  BUN 140* 135*  CREATININE 2.58* 2.35*  CALCIUM 7.9* 8.1*   LFT Recent Labs    12/31/22 0033  PROT 5.3*  ALBUMIN 3.1*  AST 15  ALT 10  ALKPHOS 47  BILITOT 0.9   PT/INR No results for input(s): "LABPROT", "INR" in the last 72 hours. Hepatitis Panel No results for input(s): "HEPBSAG", "HCVAB", "HEPAIGM", "HEPBIGM" in the last 72 hours. C-Diff No results for input(s): "CDIFFTOX" in the last 72 hours.  Studies/Results: DG Chest Port 1 View  Result Date: 12/30/2022 CLINICAL DATA:  Weakness. EXAM: PORTABLE CHEST 1 VIEW COMPARISON:  October 06, 2013. FINDINGS: Stable cardiomegaly. Minimal bibasilar subsegmental atelectasis or scarring is noted. Severe degenerative changes seen involving both glenohumeral joints. IMPRESSION: Minimal bibasilar subsegmental atelectasis or scarring. Electronically Signed   By: James  Green Jr M.D.   On: 12/30/2022 14:24    Medications: I have reviewed the  patient's current medications. Scheduled:  [START ON 01/03/2023] pantoprazole  40 mg Intravenous Q12H   pravastatin  20 mg Oral QHS   sodium chloride flush  3 mL Intravenous Q12H   Continuous:  dextrose 75 mL/hr at 12/31/22 0741   pantoprazole 8 mg/hr (12/31/22 0612)   PRN:acetaminophen **OR** acetaminophen, hydrALAZINE  Assessment/Plan: 89 year old female with history of atrial fibrillation on Xarelto, CVA, pulm HTN, CAD, hemochromatosis, and breast cancer presented with melena. Hb was 4.3 upon arrival, decreased from 11.2 two years ago.  She got 2 units of PRBCs yesterday with improvement of her hemoglobin of 7.2, which is now decreased again to 6.7.  She is getting 2 units of PRBCs today.  Last dose of Xarelto was 6/21. Patient has never had an EGD before. It is suspected that the patient is currently having an upper GI bleed.  Due to ongoing GI bleeding, the patient and her family (daughter and grandson) are now agreeing to the EGD procedure. - Continue to trend Hb.  Transfuse PRBCs as needed. - Continue PPI therapy - Keep NPO for now - Plan for EGD tomorrow.  Patient's daughter typically consents for the patient.   LOS: 0 days   Vrishank Moster C Elianna Windom 12/31/2022, 4:39 PM  

## 2022-12-31 NOTE — Progress Notes (Signed)
PROGRESS NOTE                                                                                                                                                                                                             Patient Demographics:    Tammy Lloyd, is a 89 y.o. female, DOB - [birth year], YQI:[id number]  Outpatient Primary MD for the patient is Ronnald Nian, MD    LOS - 0  Admit date - 12/30/2022    Chief Complaint  Patient presents with   Rectal Bleeding       Brief Narrative (HPI from H&P)     89 y.o. female with medical history significant of stroke, hypertension, nonobstructive CAD, atrial fibrillation with history of SVR on Xarelto, meningioma, macular degeneration, breast cancer presenting with dark stools, workup consistent with upper GI bleed causing blood loss elated anemia she was admitted for further care.   Subjective:    Tammy Lloyd today has, No headache, No chest pain, No abdominal pain - No Nausea, No new weakness tingling or numbness, no SOB   Assessment  & Plan :   Acute blood loss anemia likely caused by upper GI bleed.  In a patient who is on Xarelto for A-fib, elevated BUN, melanotic stool suggestive of upper GI bleed, s/p 2 units of packed red blood transfusion in the ER, still stool appears melanotic, continue to monitor CBC and transfuse as needed will try and keep her hemoglobin around 8 due to her age and underlying CAD, GI on board, on IV PPI, most likely will require EGD and continued CBC monitoring.   History of CVA, CAD, paroxysmal A-fib.  Xarelto on hold due to #1 above, continue statin as tolerated for secondary prevention.  AKI on CKD 3A.  Last creatinine few years ago was 1.3.  AKI due to anemia and prerenal azotemia, transfuse and monitor.  Avoid nephrotoxins.  Hypertension.  Low-dose Norvasc for now.  Dyslipidemia.  On statin.  History of macular degeneration, breast cancer and  meningioma.  Supportive care, follow-up with PCP.    Advanced age, frail status.  PT OT, supportive care.  DNR.      Condition - Extremely Guarded  Family Communication  :  daughter Jeronimo Norma [phone]  on 12/31/22  Code Status :  DNR  Consults  :  GI  PUD  Prophylaxis : PPI   Procedures  :            Disposition Plan  :    Status is: Observation  DVT Prophylaxis  :    SCDs Start: 12/30/22 1458    Lab Results  Component Value Date   PLT 229 12/31/2022    Diet :  Diet Order             Diet NPO time specified  Diet effective now                    Inpatient Medications  Scheduled Meds:  [START ON 01/03/2023] pantoprazole  40 mg Intravenous Q12H   pravastatin  20 mg Oral QHS   sodium chloride flush  3 mL Intravenous Q12H   Continuous Infusions:  dextrose 75 mL/hr at 12/31/22 0741   pantoprazole 8 mg/hr (12/31/22 0612)   PRN Meds:.acetaminophen **OR** acetaminophen  Antibiotics  :    Anti-infectives (From admission, onward)    None         Objective:   Vitals:   12/31/22 0500 12/31/22 0515 12/31/22 0530 12/31/22 0545  BP: (!) 129/40 (!) 140/45 (!) 136/54 (!) 124/43  Pulse: (!) 53 (!) 54 (!) 51 (!) 52  Resp: 16 19 15 17   Temp:      TempSrc:      SpO2: 97% 98% 96% 96%  Weight:      Height:        Wt Readings from Last 3 Encounters:  12/30/22 68 kg  06/09/22 65.8 kg  12/21/21 65.8 kg     Intake/Output Summary (Last 24 hours) at 12/31/2022 0759 Last data filed at 12/31/2022 0542 Gross per 24 hour  Intake 776.83 ml  Output 151 ml  Net 625.83 ml     Physical Exam  Awake Alert, No new F.N deficits, Normal affect [place].AT,PERRAL Supple Neck, No JVD,   Symmetrical Chest wall movement, Good air movement bilaterally, CTAB RRR,No Gallops,Rubs or new Murmurs,  +ve B.Sounds, Abd Soft, No tenderness,   No Cyanosis, Clubbing or edema     RN pressure injury documentation: Pressure Injury 12/30/22 Sacrum Mid Stage 2 -  Partial  thickness loss of dermis presenting as a shallow open injury with a red, pink wound bed without slough. (Active)  12/30/22 2000  Location: Sacrum  Location Orientation: Mid  Staging: Stage 2 -  Partial thickness loss of dermis presenting as a shallow open injury with a red, pink wound bed without slough.  Wound Description (Comments):   Present on Admission: Yes  Dressing Type Foam - Lift dressing to assess site every shift 12/31/22 0400      Data Review:    Recent Labs  Lab 12/30/22 1342 12/31/22 0033  WBC 7.8 8.5  HGB 4.3* 7.2*  HCT 13.8* 22.3*  PLT 262 229  MCV 106.2* 89.6  MCH 33.1 28.9  MCHC 31.2 32.3  RDW 13.4 22.9*  LYMPHSABS 0.7 2.0  MONOABS 0.7 1.0  EOSABS 0.1 0.1  BASOSABS 0.0 0.1    Recent Labs  Lab 12/30/22 1342 12/31/22 0033  NA 135 136  K 4.5 4.2  CL 104 106  CO2 18* 18*  ANIONGAP 13 12  GLUCOSE 159* 116*  BUN 140* 135*  CREATININE 2.58* 2.35*  AST 14* 15  ALT 8 10  ALKPHOS 46 47  BILITOT <0.1* 0.9  ALBUMIN 3.1* 3.1*  CALCIUM 7.9* 8.1*    Radiology Reports DG Chest Port 1 View  Result Date:  12/30/2022 CLINICAL DATA:  Weakness. EXAM: PORTABLE CHEST 1 VIEW COMPARISON:  October 06, 2013. FINDINGS: Stable cardiomegaly. Minimal bibasilar subsegmental atelectasis or scarring is noted. Severe degenerative changes seen involving both glenohumeral joints. IMPRESSION: Minimal bibasilar subsegmental atelectasis or scarring. Electronically Signed   By: Lupita Raider M.D.   On: 12/30/2022 14:24      Signature  -   Susa Raring M.D on 12/31/2022 at 7:59 AM   -  To page go to [email or web address]

## 2022-12-31 NOTE — Progress Notes (Signed)
OT Cancellation Note  Patient Details Name: Tammy Lloyd MRN: [id number] DOB: [birth year]   Cancelled Treatment:    Reason Eval/Treat Not Completed: Patient not medically ready (RN requests hold of skilled OT eval this day. Per RN, pt is not hemodynamically stable and not medically ready to participate. OT to reattempt eval at a later date as appropriate/available.)  Shiloh Southern "Orson Eva., OTR/L, MA Acute Rehab [phone]   Lendon Colonel 12/31/2022, 9:34 AM

## 2023-01-01 ENCOUNTER — Encounter (HOSPITAL_COMMUNITY): Payer: Self-pay | Admitting: Internal Medicine

## 2023-01-01 ENCOUNTER — Inpatient Hospital Stay (HOSPITAL_COMMUNITY): Payer: Medicare Other

## 2023-01-01 ENCOUNTER — Encounter (HOSPITAL_COMMUNITY)
Admission: EM | Disposition: A | Discharge status: Hospice | Payer: Self-pay | Source: Home / Self Care | Attending: Internal Medicine

## 2023-01-01 ENCOUNTER — Inpatient Hospital Stay (HOSPITAL_COMMUNITY): Payer: Medicare Other | Admitting: Anesthesiology

## 2023-01-01 DIAGNOSIS — K921 Melena: Secondary | ICD-10-CM | POA: Diagnosis not present

## 2023-01-01 DIAGNOSIS — D62 Acute posthemorrhagic anemia: Secondary | ICD-10-CM | POA: Diagnosis not present

## 2023-01-01 DIAGNOSIS — K922 Gastrointestinal hemorrhage, unspecified: Secondary | ICD-10-CM | POA: Diagnosis not present

## 2023-01-01 DIAGNOSIS — I2510 Atherosclerotic heart disease of native coronary artery without angina pectoris: Secondary | ICD-10-CM

## 2023-01-01 DIAGNOSIS — I129 Hypertensive chronic kidney disease with stage 1 through stage 4 chronic kidney disease, or unspecified chronic kidney disease: Secondary | ICD-10-CM

## 2023-01-01 DIAGNOSIS — N189 Chronic kidney disease, unspecified: Secondary | ICD-10-CM

## 2023-01-01 HISTORY — PX: ESOPHAGOGASTRODUODENOSCOPY (EGD) WITH PROPOFOL: SHX5813

## 2023-01-01 LAB — BASIC METABOLIC PANEL
Anion gap: 12 (ref 5–15)
BUN: 126 mg/dL — ABNORMAL HIGH (ref 8–23)
CO2: 18 mmol/L — ABNORMAL LOW (ref 22–32)
Calcium: 8.1 mg/dL — ABNORMAL LOW (ref 8.9–10.3)
Chloride: 108 mmol/L (ref 98–111)
Creatinine, Ser: 2.08 mg/dL — ABNORMAL HIGH (ref 0.44–1.00)
GFR, Estimated: 21 mL/min — ABNORMAL LOW (ref >60)
Glucose, Bld: 108 mg/dL — ABNORMAL HIGH (ref 70–99)
Potassium: 3.5 mmol/L (ref 3.5–5.1)
Sodium: 138 mmol/L (ref 135–145)

## 2023-01-01 LAB — CBC
HCT: 21.4 % — ABNORMAL LOW (ref 36.0–46.0)
Hemoglobin: 7.3 g/dL — ABNORMAL LOW (ref 12.0–15.0)
MCH: 30.4 pg (ref 26.0–34.0)
MCHC: 34.1 g/dL (ref 30.0–36.0)
MCV: 89.2 fL (ref 80.0–100.0)
Platelets: 190 10*3/uL (ref 150–400)
RBC: 2.4 MIL/uL — ABNORMAL LOW (ref 3.87–5.11)
RDW: 19.2 % — ABNORMAL HIGH (ref 11.5–15.5)
WBC: 9 10*3/uL (ref 4.0–10.5)
nRBC: 0 % (ref 0.0–0.2)

## 2023-01-01 LAB — CBC WITH DIFFERENTIAL/PLATELET
Abs Immature Granulocytes: 0.03 10*3/uL (ref 0.00–0.07)
Basophils Absolute: 0 10*3/uL (ref 0.0–0.1)
Basophils Relative: 0 %
Eosinophils Absolute: 0.2 10*3/uL (ref 0.0–0.5)
Eosinophils Relative: 2 %
HCT: 25.2 % — ABNORMAL LOW (ref 36.0–46.0)
Hemoglobin: 8.6 g/dL — ABNORMAL LOW (ref 12.0–15.0)
Immature Granulocytes: 0 %
Lymphocytes Relative: 14 %
Lymphs Abs: 1.4 10*3/uL (ref 0.7–4.0)
MCH: 30.9 pg (ref 26.0–34.0)
MCHC: 34.1 g/dL (ref 30.0–36.0)
MCV: 90.6 fL (ref 80.0–100.0)
Monocytes Absolute: 1 10*3/uL (ref 0.1–1.0)
Monocytes Relative: 10 %
Neutro Abs: 7.1 10*3/uL (ref 1.7–7.7)
Neutrophils Relative %: 74 %
Platelets: 182 10*3/uL (ref 150–400)
RBC: 2.78 MIL/uL — ABNORMAL LOW (ref 3.87–5.11)
RDW: 19.3 % — ABNORMAL HIGH (ref 11.5–15.5)
WBC: 9.8 10*3/uL (ref 4.0–10.5)
nRBC: 0 % (ref 0.0–0.2)

## 2023-01-01 LAB — TYPE AND SCREEN
Antibody Screen: NEGATIVE
Unit division: 0

## 2023-01-01 LAB — BPAM RBC: Unit Type and Rh: 5100

## 2023-01-01 LAB — PREPARE RBC (CROSSMATCH)

## 2023-01-01 LAB — MAGNESIUM: Magnesium: 2.1 mg/dL (ref 1.7–2.4)

## 2023-01-01 LAB — BRAIN NATRIURETIC PEPTIDE: B Natriuretic Peptide: 622 pg/mL — ABNORMAL HIGH (ref 0.0–100.0)

## 2023-01-01 SURGERY — ESOPHAGOGASTRODUODENOSCOPY (EGD) WITH PROPOFOL
Anesthesia: Monitor Anesthesia Care

## 2023-01-01 MED ORDER — FUROSEMIDE 10 MG/ML IJ SOLN
20.0000 mg | Freq: Once | INTRAMUSCULAR | Status: AC
  Administered 2023-01-01: 20 mg via INTRAVENOUS
  Filled 2023-01-01: qty 2

## 2023-01-01 MED ORDER — DEXTROSE 5 % IV SOLN: INTRAVENOUS | Status: DC

## 2023-01-01 MED ORDER — SODIUM CHLORIDE 0.9 % IV SOLN: INTRAVENOUS | Status: DC

## 2023-01-01 MED ORDER — PROPOFOL 500 MG/50ML IV EMUL
INTRAVENOUS | Status: DC | PRN
  Administered 2023-01-01: 100 ug/kg/min via INTRAVENOUS

## 2023-01-01 MED ORDER — PROPOFOL 10 MG/ML IV BOLUS
INTRAVENOUS | Status: DC | PRN
  Administered 2023-01-01: 20 mg via INTRAVENOUS
  Administered 2023-01-01: 10 mg via INTRAVENOUS

## 2023-01-01 MED ORDER — EPHEDRINE SULFATE-NACL 50-0.9 MG/10ML-% IV SOSY
PREFILLED_SYRINGE | INTRAVENOUS | Status: DC | PRN
  Administered 2023-01-01: 10 mg via INTRAVENOUS

## 2023-01-01 MED ORDER — TECHNETIUM TC 99M-LABELED RED BLOOD CELLS IV KIT
25.0000 | PACK | Freq: Once | INTRAVENOUS | Status: AC | PRN
  Administered 2023-01-01: 25 via INTRAVENOUS

## 2023-01-01 MED ORDER — POTASSIUM CHLORIDE CRYS ER 20 MEQ PO TBCR
40.0000 meq | EXTENDED_RELEASE_TABLET | Freq: Once | ORAL | Status: DC
  Filled 2023-01-01: qty 2

## 2023-01-01 MED ORDER — SODIUM CHLORIDE 0.9% IV SOLUTION: Freq: Once | INTRAVENOUS | Status: AC

## 2023-01-01 SURGICAL SUPPLY — 15 items

## 2023-01-01 NOTE — Anesthesia Postprocedure Evaluation (Signed)
Anesthesia Post Note  Patient: Tammy Lloyd  Procedure(s) Performed: ESOPHAGOGASTRODUODENOSCOPY (EGD) WITH PROPOFOL     Patient location during evaluation: PACU Anesthesia Type: MAC Level of consciousness: awake and alert Pain management: pain level controlled Vital Signs Assessment: post-procedure vital signs reviewed and stable Respiratory status: spontaneous breathing, nonlabored ventilation and respiratory function stable Cardiovascular status: blood pressure returned to baseline and stable Postop Assessment: no apparent nausea or vomiting Anesthetic complications: no   No notable events documented.  Last Vitals:  Vitals:   01/01/23 1145 01/01/23 1205  BP:  (!) 127/46  Pulse: (!) 53 (!) 46  Resp: 15 18  Temp: 36.4 C   SpO2: 98%     Last Pain:  Vitals:   01/01/23 1145  TempSrc:   PainSc: 0-No pain                 Lannie Fields

## 2023-01-01 NOTE — Plan of Care (Signed)

## 2023-01-01 NOTE — Procedures (Addendum)
I spoke to the patient's daughter Tammy Lloyd by phone post procedure We discussed the findings as well as the treatment plan and my recommendations  We will proceed with a tagged red cell study with nuclear medicine given the inability for CT angiography due to acute on chronic kidney disease Continue supportive care  Time provided for questions and answers and she thanked me for the call

## 2023-01-01 NOTE — Evaluation (Signed)
Occupational Therapy Evaluation Patient Details Name: Tammy Lloyd MRN: [id number] DOB: 1924-03-03 Today's Date: 01/01/2023   History of Present Illness Pt is a 71 y female admitted with dark stool. Pt with GI bleed and Hgb of 4.3. Pt for EGD today.  PMH: CVA, HTN, CAD, afib on Xeralto, nenigioma, macular degeneration and breast CA.   Clinical Impression   Pt admitted with above diagnosis and has deficits listed below. Pt would benefit from cont OT to increase independence with grooming and feeding.  Pt is dependent with all other adls and has been a hoyer lift transfer for some time now therefore does not have mobility needs. Spoke with family about skin care since pt bed and recliner bound. Pt is very involved in her care.  Will follow to increase independence with grooming and feeding. Pt should be safe to d/c home with family once stable since she has been total care for awhile now.       Recommendations for follow up therapy are one component of a multi-disciplinary discharge planning process, led by the attending physician.  Recommendations may be updated based on patient status, additional functional criteria and insurance authorization.   Assistance Recommended at Discharge Frequent or constant Supervision/Assistance  Patient can return home with the following A lot of help with bathing/dressing/bathroom;Assistance with cooking/housework;Direct supervision/assist for medications management;Direct supervision/assist for financial management;Assist for transportation;Help with stairs or ramp for entrance    Functional Status Assessment  Patient has had a recent decline in their functional status and demonstrates the ability to make significant improvements in function in a reasonable and predictable amount of time.  Equipment Recommendations  None recommended by OT    Recommendations for Other Services       Precautions / Restrictions Precautions Precautions:  Fall Restrictions Weight Bearing Restrictions: No Other Position/Activity Restrictions: Pt with B LEs contracted. Has not stood in months.      Mobility Bed Mobility Overal bed mobility: Needs Assistance Bed Mobility: Rolling, Sidelying to Sit, Sit to Sidelying Rolling: Min assist Sidelying to sit: Total assist     Sit to sidelying: Total assist General bed mobility comments: Pt total assist outside of rolling    Transfers                   General transfer comment: Pt is hoyer lift at baseline      Balance Overall balance assessment: Needs assistance Sitting-balance support: Feet supported Sitting balance-Leahy Scale: Poor   Postural control: Posterior lean     Standing balance comment: Pt does not stand                           ADL either performed or assessed with clinical judgement   ADL Overall ADL's : Needs assistance/impaired Eating/Feeding: Minimal assistance;Sitting   Grooming: Wash/dry hands;Wash/dry face;Oral care;Minimal assistance;Sitting;Bed level   Upper Body Bathing: Total assistance   Lower Body Bathing: Total assistance   Upper Body Dressing : Total assistance   Lower Body Dressing: Total assistance   Toilet Transfer: Total assistance;+2 for physical assistance   Toileting- Clothing Manipulation and Hygiene: Total assistance;+2 for physical assistance     Tub/Shower Transfer Details (indicate cue type and reason): sponge bathes only Functional mobility during ADLs: Total assistance;+2 for physical assistance General ADL Comments: pt is dependent at baseline outside of feeding self and grooming.     Vision Baseline Vision/History: 6 Macular Degeneration Ability to See in Adequate Light: 1 Impaired  Patient Visual Report: No change from baseline Vision Assessment?: Yes Eye Alignment: Within Functional Limits Ocular Range of Motion: Within Functional Limits Alignment/Gaze Preference: Within Defined  Limits Tracking/Visual Pursuits: Able to track stimulus in all quads without difficulty Visual Fields: No apparent deficits Additional Comments: Pt wilt hmacular degeneration at baseline     Perception     Praxis      Pertinent Vitals/Pain Pain Assessment Pain Assessment: Faces Faces Pain Scale: Hurts a little bit Pain Location: when cleaning bottom Pain Descriptors / Indicators: Discomfort Pain Intervention(s): Monitored during session, Repositioned     Hand Dominance Right   Extremity/Trunk Assessment Upper Extremity Assessment Upper Extremity Assessment: Generalized weakness   Lower Extremity Assessment Lower Extremity Assessment: Generalized weakness (B LEs contracted due to long term being bed/recliner bound.)   Cervical / Trunk Assessment Cervical / Trunk Assessment: Kyphotic   Communication Communication Communication: HOH   Cognition Arousal/Alertness: Awake/alert Behavior During Therapy: WFL for tasks assessed/performed Overall Cognitive Status: History of cognitive impairments - at baseline                                       General Comments  pt does not stand at baseline    Exercises     Shoulder Instructions      Home Living Family/patient expects to be discharged to:: Private residence Living Arrangements: Children Available Help at Discharge: Family;Available 24 hours/day Type of Home: House Home Access: Ramped entrance     Home Layout: One level     Bathroom Shower/Tub: Sponge bathes at baseline   [employer]:  (uses depends)     Home Equipment: Agricultural consultant (2 wheels);Hospital bed;Other (comment) (hoyer lift)          Prior Functioning/Environment Prior Level of Function : Needs assist       Physical Assist : Mobility (physical);ADLs (physical) Mobility (physical): Bed mobility;Transfers ADLs (physical): Bathing;Dressing;Toileting;IADLs Mobility Comments: fully dependent. family uses hoyer lift to  transfer her to and from recliner to bed. ADLs Comments: pt dependent in all adls outside of grooming and feeding.        OT Problem List: Decreased strength;Decreased range of motion;Impaired balance (sitting and/or standing);Impaired vision/perception;Decreased coordination      OT Treatment/Interventions: Self-care/ADL training    OT Goals(Current goals can be found in the care plan section) Acute Rehab OT Goals Patient Stated Goal: to feel better OT Goal Formulation: With patient/family Time For Goal Achievement: 01/15/23 Potential to Achieve Goals: Good ADL Goals Pt Will Perform Eating: with modified independence;bed level;sitting Pt Will Perform Grooming: with supervision;bed level;sitting  OT Frequency: Min 1X/week    Co-evaluation              AM-PAC OT "6 Clicks" Daily Activity     Outcome Measure Help from another person eating meals?: A Little Help from another person taking care of personal grooming?: A Little Help from another person toileting, which includes using toliet, bedpan, or urinal?: Total Help from another person bathing (including washing, rinsing, drying)?: Total Help from another person to put on and taking off regular upper body clothing?: Total Help from another person to put on and taking off regular lower body clothing?: Total 6 Click Score: 10   End of Session Nurse Communication: Mobility status  Activity Tolerance: Patient tolerated treatment well Patient left: in bed;with call bell/phone within reach;Other (comment) (leaving for EGD)  OT Visit Diagnosis: Unsteadiness  on feet (R26.81);Other abnormalities of gait and mobility (R26.89);Muscle weakness (generalized) (M62.81)                Time: 6213-0865 OT Time Calculation (min): 22 min Charges:  OT General Charges $OT Visit: 1 Visit OT Evaluation $OT Eval Low Complexity: 1 Low  Hope Budds 01/01/2023, 10:19 AM

## 2023-01-01 NOTE — Interval H&P Note (Signed)
History and Physical Interval Note: 89 year old female on Xarelto with history of atrial fibrillation, CVA and pulmonary hypertension, hemochromatosis, remote breast cancer presenting with melena and severe anemia. Status post 4 units of packed cells Daughter at bedside Plan for EGD with MAC Xarelto on hold HIGHER THAN BASELINE RISK.The nature of the procedure, as well as the risks, benefits, and alternatives were carefully and thoroughly reviewed with the patient. Ample time for discussion and questions allowed. The patient understood, was satisfied, and agreed to proceed.   01/01/2023 10:50 AM  Quay Burow  has presented today for surgery, with the diagnosis of Melena, anemia.  The various methods of treatment have been discussed with the patient and family. After consideration of risks, benefits and other options for treatment, the patient has consented to  Procedure(s): ESOPHAGOGASTRODUODENOSCOPY (EGD) WITH PROPOFOL (N/A) as a surgical intervention.  The patient's history has been reviewed, patient examined, no change in status, stable for surgery.  I have reviewed the patient's chart and labs.  Questions were answered to the patient's satisfaction.     Carie Caddy Shyloh Krinke

## 2023-01-01 NOTE — Anesthesia Preprocedure Evaluation (Addendum)
Anesthesia Evaluation  Patient identified by MRN, date of birth, ID band Patient awake    Reviewed: Allergy & Precautions, NPO status , Patient's Chart, lab work & pertinent test results  Airway Mallampati: III  TM Distance: >3 FB Neck ROM: Full    Dental  (+) Dental Advisory Given, Edentulous Lower   Pulmonary PE   Pulmonary exam normal breath sounds clear to auscultation       Cardiovascular hypertension, Pt. on medications pulmonary hypertension (severe pHTN on echo 2021)+ CAD  Normal cardiovascular exam+ dysrhythmias Atrial Fibrillation + Valvular Problems/Murmurs (mild MR on echo 2021) MR  Rhythm:Regular Rate:Normal  Echo 2021 1. Left ventricular ejection fraction, by estimation, is 55 to 60%. The  left ventricle has normal function. There is moderate left ventricular  hypertrophy. Left ventricular diastolic parameters are indeterminate.  There is the interventricular septum is  flattened in systole and diastole, consistent with right ventricular  pressure and volume overload.   2. Right ventricular systolic function is normal. The right ventricular  size is moderately enlarged. There is severely elevated pulmonary artery  systolic pressure. The estimated right ventricular systolic pressure is  91.0 mmHg.   3. Left atrial size was moderately dilated.   4. Right atrial size was severely dilated.   5. The mitral valve is normal in structure. Mild mitral valve  regurgitation. No evidence of mitral stenosis.   6. Tricuspid valve regurgitation is mild to moderate.   7. The aortic valve is normal in structure. Aortic valve regurgitation is  not visualized. Mild aortic valve sclerosis is present, with no evidence  of aortic valve stenosis.   8. The inferior vena cava is dilated in size with <50% respiratory  variability, suggesting right atrial pressure of 15 mmHg.     Neuro/Psych CVA (2018), No Residual Symptoms  negative  psych ROS   GI/Hepatic Neg liver ROS,,,melena   Endo/Other  negative endocrine ROS    Renal/GU CRFRenal diseaseCr 2.08  negative genitourinary   Musculoskeletal  (+) Arthritis , Osteoarthritis,    Abdominal   Peds  Hematology  (+) Blood dyscrasia, anemia Melena, HB 8.6 this morning s/p 2 units prbc (hb 4.3 on arrival to ED)   Anesthesia Other Findings   Reproductive/Obstetrics negative OB ROS                             Anesthesia Physical Anesthesia Plan  ASA: 4  Anesthesia Plan: MAC   Post-op Pain Management:    Induction:   PONV Risk Score and Plan: 2 and Propofol infusion and TIVA  Airway Management Planned: Natural Airway and Simple Face Mask  Additional Equipment: None  Intra-op Plan:   Post-operative Plan:   Informed Consent: I have reviewed the patients History and Physical, chart, labs and discussed the procedure including the risks, benefits and alternatives for the proposed anesthesia with the patient or authorized representative who has indicated his/her understanding and acceptance.   Patient has DNR.  Discussed DNR with patient and Continue DNR.     Plan Discussed with: CRNA  Anesthesia Plan Comments:         Anesthesia Quick Evaluation

## 2023-01-01 NOTE — Telephone Encounter (Signed)
Refills not due until 02/2023.

## 2023-01-01 NOTE — Progress Notes (Signed)
Call report from radiology indicates the tagged nuclear red cell scan shows active bleeding in the distal small bowel  Discussed with Dr. Thedore Mins with [facility] as well as with the patient's daughter by phone.  Given location it would be likely impossible to reach this area endoscopically.  IR for embolization with the patient's severe kidney impairment would be exceedingly high risk for precipitating renal failure and surgery would be very high risk with a high chance for death.  I discussed the above with the patient's daughter and recommended conservative supportive care.  Keep patient off of NSAIDs, aspirin and blood thinning medications.  Transfuse packed cells as needed.  Hopefully this will stop on its own. Time will tell and we will continue to follow her closely  The patient's daughter thanked me for the call and understands the plan

## 2023-01-01 NOTE — Transfer of Care (Signed)
Immediate Anesthesia Transfer of Care Note  Patient: Tammy Lloyd  Procedure(s) Performed: ESOPHAGOGASTRODUODENOSCOPY (EGD) WITH PROPOFOL  Patient Location: PACU  Anesthesia Type:MAC  Level of Consciousness: drowsy and patient cooperative  Airway & Oxygen Therapy: Patient Spontanous Breathing  Post-op Assessment: Report given to RN and Post -op Vital signs reviewed and stable  Post vital signs: Reviewed and stable  Last Vitals:  Vitals Value Taken Time  BP 96/43 01/01/23 1133  Temp    Pulse 52 01/01/23 1134  Resp 16 01/01/23 1134  SpO2 98 % 01/01/23 1134  Vitals shown include unvalidated device data.  Last Pain:  Vitals:   01/01/23 1031  TempSrc: Temporal  PainSc: 0-No pain         Complications: No notable events documented.

## 2023-01-01 NOTE — Op Note (Signed)
[facility] Patient Name: Tammy Lloyd Procedure Date : 01/01/2023 MRN: [id number] Attending MD: Beverley Fiedler , MD, [id number] Date of Birth: 04-09-1924 CSN: [id number] Age: 89 Admit Type: Inpatient Procedure:                Upper GI endoscopy Indications:              Acute post hemorrhagic anemia, Hematochezia/melena Providers:                Carie Caddy. Rhea Belton, MD, Stephens Shire RN, RN, Beryle Beams, Technician, Rosiland Oz, CRNA Referring MD:             [facility] Medicines:                Monitored Anesthesia Care Complications:            No immediate complications. Estimated Blood Loss:     Estimated blood loss: none. Procedure:                Pre-Anesthesia Assessment:                           - Prior to the procedure, a History and Physical                            was performed, and patient medications and                            allergies were reviewed. The patient's tolerance of                            previous anesthesia was also reviewed. The risks                            and benefits of the procedure and the sedation                            options and risks were discussed with the patient.                            All questions were answered, and informed consent                            was obtained. Prior Anticoagulants: The patient has                            taken Xarelto (rivaroxaban), last dose was 3 days                            prior to procedure. ASA Grade Assessment: III - A                            patient with severe systemic disease. After  reviewing the risks and benefits, the patient was                            deemed in satisfactory condition to undergo the                            procedure.                           After obtaining informed consent, the endoscope was                            passed under direct vision. Throughout the                             procedure, the patient's blood pressure, pulse, and                            oxygen saturations were monitored continuously. The                            [id number]-[id number] ([id number]) Olympus endoscope was introduced                            through the mouth, and advanced to the second part                            of duodenum. The upper GI endoscopy was                            accomplished without difficulty. The patient                            tolerated the procedure well. Scope In: Scope Out: Findings:      The examined esophagus was normal.      The entire examined stomach was normal.      The examined duodenum was normal. Impression:               - Normal esophagus.                           - Normal stomach.                           - Normal examined duodenum.                           - No specimens collected. Moderate Sedation:      N/A Recommendation:           - Return patient to hospital ward for ongoing care.                           - Clear liquid diet.                           - CT abd/pelvis  without contrast (for now) given                            acute on chronic kidney disease.                           - Colonoscopy could be considered but is high risk                            and I am not certain she could prep adequately.                           - Hold DOAC given ongoing hematochezia/melena.                           - We will follow. Procedure Code(s):        --- Professional ---                           [phone], Esophagogastroduodenoscopy, flexible,                            transoral; diagnostic, including collection of                            specimen(s) by brushing or washing, when performed                            (separate procedure) Diagnosis Code(s):        --- Professional ---                           D62, Acute posthemorrhagic anemia                           K92.1, Melena (includes Hematochezia) CPT  copyright 2022 [employer] Medical Association. All rights reserved. The codes documented in this report are preliminary and upon coder review may  be revised to meet current compliance requirements. Beverley Fiedler, MD 01/01/2023 11:27:08 AM This report has been signed electronically. Number of Addenda: 0

## 2023-01-01 NOTE — Progress Notes (Signed)
Physical Therapy Discharge Patient Details Name: Tammy Lloyd MRN: [id number] DOB: Feb 20, 1924 Today's Date: 01/01/2023 Time:  -     Patient discharged from PT services secondary to  family refusing therapy services as pt is bedlevel at baseline .  Please see latest therapy progress note for current level of functioning and progress toward goals.    Progress and discharge plan discussed with patient and/or caregiver:  daughter has requested the discharge.  GP     Ivar Drape 01/01/2023, 11:00 AM   Samul Dada, PT PhD Acute Rehab Dept. Number: [facility] [id number] and [facility] [phone]

## 2023-01-01 NOTE — Progress Notes (Signed)
PROGRESS NOTE                                                                                                                                                                                                             Patient Demographics:    Tammy Lloyd, is a 89 y.o. female, DOB - 05/03/1924, YQM:[id number]  Outpatient Primary MD for the patient is Ronnald Nian, MD    LOS - 1  Admit date - 12/30/2022    Chief Complaint  Patient presents with   Rectal Bleeding       Brief Narrative (HPI from H&P)     89 y.o. female with medical history significant of stroke, hypertension, nonobstructive CAD, atrial fibrillation with history of SVR on Xarelto, meningioma, macular degeneration, breast cancer presenting with dark stools, workup consistent with upper GI bleed causing blood loss elated anemia she was admitted for further care.   Subjective:   Patient in bed, appears comfortable, denies any headache, no fever, no chest pain or pressure, no shortness of breath , no abdominal pain. No new focal weakness.   Assessment  & Plan :   Acute blood loss anemia likely caused by upper GI bleed.  In a patient who is on Xarelto for A-fib, elevated BUN, melanotic stool suggestive of upper GI bleed, s/p 2 units of packed red blood transfusion in the ER, still stool appears melanotic, continue to monitor CBC and transfuse as needed will try and keep her hemoglobin around 8 due to her age and underlying CAD, GI on board, on IV PPI, CBC stable posttransfusion, EGD likely 01/01/2023.  History of CVA, CAD, paroxysmal A-fib.  Xarelto on hold due to #1 above, continue statin as tolerated for secondary prevention.  AKI on CKD 3A.  Last creatinine few years ago was 1.3.  AKI due to anemia and prerenal azotemia, transfuse and monitor.  Avoid nephrotoxins.  Renal function mildly improving.  Hypertension.  Low-dose Norvasc for now.  Dyslipidemia.  On  statin.  History of macular degeneration, breast cancer and meningioma.  Supportive care, follow-up with PCP.    Advanced age, frail status.  PT OT, supportive care.  DNR.      Condition - Extremely Guarded  Family Communication  :  daughter Jeronimo Norma [phone]  on 12/31/22  Code Status :  DNR  Consults  :  GI  PUD Prophylaxis : PPI   Procedures  :     EGD due 01/01/2023.      Disposition Plan  :    Status is: Observation  DVT Prophylaxis  :    SCDs Start: 12/30/22 1458    Lab Results  Component Value Date   PLT 182 01/01/2023    Diet :  Diet Order             Diet NPO time specified  Diet effective now                    Inpatient Medications  Scheduled Meds:  [MAR Hold] pantoprazole  40 mg Intravenous Q12H   [MAR Hold] potassium chloride  40 mEq Oral Once   [MAR Hold] pravastatin  20 mg Oral QHS   [MAR Hold] sodium chloride flush  3 mL Intravenous Q12H   Continuous Infusions:  sodium chloride 20 mL/hr at 01/01/23 0624   dextrose 50 mL/hr at 01/01/23 0635   pantoprazole 8 mg/hr (01/01/23 0639)   PRN Meds:.[MAR Hold] acetaminophen **OR** [MAR Hold] acetaminophen, [MAR Hold] hydrALAZINE  Antibiotics  :    Anti-infectives (From admission, onward)    None         Objective:   Vitals:   01/01/23 0021 01/01/23 0326 01/01/23 0500 01/01/23 0752  BP: 96/78 (!) 144/57  (!) 136/58  Pulse: 60 62  61  Resp: 16   20  Temp: 97.6 F (36.4 C) 98 F (36.7 C)  (!) 97.3 F (36.3 C)  TempSrc: Oral Oral  Oral  SpO2: 96% 95%  97%  Weight:   69.1 kg   Height:        Wt Readings from Last 3 Encounters:  01/01/23 69.1 kg  06/09/22 65.8 kg  12/21/21 65.8 kg     Intake/Output Summary (Last 24 hours) at 01/01/2023 1032 Last data filed at 01/01/2023 1000 Gross per 24 hour  Intake 315 ml  Output 850 ml  Net -535 ml     Physical Exam  Awake Alert, No new F.N deficits, Normal affect [place].AT,PERRAL Supple Neck, No JVD,   Symmetrical Chest  wall movement, Good air movement bilaterally, CTAB RRR,No Gallops,Rubs or new Murmurs,  +ve B.Sounds, Abd Soft, No tenderness,   No Cyanosis, Clubbing or edema     RN pressure injury documentation: Pressure Injury 12/30/22 Sacrum Mid Stage 2 -  Partial thickness loss of dermis presenting as a shallow open injury with a red, pink wound bed without slough. (Active)  12/30/22 2000  Location: Sacrum  Location Orientation: Mid  Staging: Stage 2 -  Partial thickness loss of dermis presenting as a shallow open injury with a red, pink wound bed without slough.  Wound Description (Comments):   Present on Admission: Yes  Dressing Type Foam - Lift dressing to assess site every shift 12/31/22 2000      Data Review:    Recent Labs  Lab 12/30/22 1342 12/31/22 0033 12/31/22 0815 12/31/22 2000 01/01/23 0433  WBC 7.8 8.5 8.6 9.8 9.8  HGB 4.3* 7.2* 6.7* 9.7* 8.6*  HCT 13.8* 22.3* 20.0* 29.3* 25.2*  PLT 262 229 209 204 182  MCV 106.2* 89.6 87.0 89.1 90.6  MCH 33.1 28.9 29.1 29.5 30.9  MCHC 31.2 32.3 33.5 33.1 34.1  RDW 13.4 22.9* 23.4* 19.5* 19.3*  LYMPHSABS 0.7 2.0  --   --  1.4  MONOABS 0.7 1.0  --   --  1.0  EOSABS 0.1 0.1  --   --  0.2  BASOSABS 0.0 0.1  --   --  0.0    Recent Labs  Lab 12/30/22 1342 12/31/22 0033 01/01/23 0433  NA 135 136 138  K 4.5 4.2 3.5  CL 104 106 108  CO2 18* 18* 18*  ANIONGAP 13 12 12   GLUCOSE 159* 116* 108*  BUN 140* 135* 126*  CREATININE 2.58* 2.35* 2.08*  AST 14* 15  --   ALT 8 10  --   ALKPHOS 46 47  --   BILITOT <0.1* 0.9  --   ALBUMIN 3.1* 3.1*  --   BNP  --   --  622.0*  MG  --   --  2.1  CALCIUM 7.9* 8.1* 8.1*    Radiology Reports DG Chest Port 1 View  Result Date: 12/30/2022 CLINICAL DATA:  Weakness. EXAM: PORTABLE CHEST 1 VIEW COMPARISON:  October 06, 2013. FINDINGS: Stable cardiomegaly. Minimal bibasilar subsegmental atelectasis or scarring is noted. Severe degenerative changes seen involving both glenohumeral joints. IMPRESSION:  Minimal bibasilar subsegmental atelectasis or scarring. Electronically Signed   By: Lupita Raider M.D.   On: 12/30/2022 14:24      Signature  -   Susa Raring M.D on 01/01/2023 at 10:32 AM   -  To page go to [email or web address]

## 2023-01-02 DIAGNOSIS — I4891 Unspecified atrial fibrillation: Secondary | ICD-10-CM | POA: Diagnosis not present

## 2023-01-02 DIAGNOSIS — K921 Melena: Secondary | ICD-10-CM | POA: Diagnosis not present

## 2023-01-02 DIAGNOSIS — D62 Acute posthemorrhagic anemia: Secondary | ICD-10-CM | POA: Diagnosis not present

## 2023-01-02 DIAGNOSIS — Z7901 Long term (current) use of anticoagulants: Secondary | ICD-10-CM | POA: Diagnosis not present

## 2023-01-02 DIAGNOSIS — K922 Gastrointestinal hemorrhage, unspecified: Secondary | ICD-10-CM | POA: Diagnosis not present

## 2023-01-02 LAB — CBC
HCT: 26.3 % — ABNORMAL LOW (ref 36.0–46.0)
Hemoglobin: 8.9 g/dL — ABNORMAL LOW (ref 12.0–15.0)
MCH: 30.1 pg (ref 26.0–34.0)
MCHC: 33.8 g/dL (ref 30.0–36.0)
MCV: 88.9 fL (ref 80.0–100.0)
Platelets: 171 10*3/uL (ref 150–400)
RBC: 2.96 MIL/uL — ABNORMAL LOW (ref 3.87–5.11)
RDW: 17.3 % — ABNORMAL HIGH (ref 11.5–15.5)
WBC: 9.7 10*3/uL (ref 4.0–10.5)
nRBC: 0 % (ref 0.0–0.2)

## 2023-01-02 LAB — CBC WITH DIFFERENTIAL/PLATELET
Abs Immature Granulocytes: 0.04 10*3/uL (ref 0.00–0.07)
Basophils Absolute: 0 10*3/uL (ref 0.0–0.1)
Basophils Relative: 0 %
Eosinophils Absolute: 0.3 10*3/uL (ref 0.0–0.5)
Eosinophils Relative: 3 %
HCT: 23.1 % — ABNORMAL LOW (ref 36.0–46.0)
Hemoglobin: 7.7 g/dL — ABNORMAL LOW (ref 12.0–15.0)
Immature Granulocytes: 0 %
Lymphocytes Relative: 17 %
Lymphs Abs: 1.5 10*3/uL (ref 0.7–4.0)
MCH: 30.7 pg (ref 26.0–34.0)
MCHC: 33.3 g/dL (ref 30.0–36.0)
MCV: 92 fL (ref 80.0–100.0)
Monocytes Absolute: 1.1 10*3/uL — ABNORMAL HIGH (ref 0.1–1.0)
Monocytes Relative: 12 %
Neutro Abs: 6 10*3/uL (ref 1.7–7.7)
Neutrophils Relative %: 68 %
Platelets: 157 10*3/uL (ref 150–400)
RBC: 2.51 MIL/uL — ABNORMAL LOW (ref 3.87–5.11)
RDW: 17.4 % — ABNORMAL HIGH (ref 11.5–15.5)
WBC: 9 10*3/uL (ref 4.0–10.5)
nRBC: 0 % (ref 0.0–0.2)

## 2023-01-02 LAB — BASIC METABOLIC PANEL
Anion gap: 11 (ref 5–15)
BUN: 109 mg/dL — ABNORMAL HIGH (ref 8–23)
CO2: 18 mmol/L — ABNORMAL LOW (ref 22–32)
Calcium: 7.9 mg/dL — ABNORMAL LOW (ref 8.9–10.3)
Chloride: 108 mmol/L (ref 98–111)
Creatinine, Ser: 1.87 mg/dL — ABNORMAL HIGH (ref 0.44–1.00)
GFR, Estimated: 24 mL/min — ABNORMAL LOW (ref >60)
Glucose, Bld: 100 mg/dL — ABNORMAL HIGH (ref 70–99)
Potassium: 3.8 mmol/L (ref 3.5–5.1)
Sodium: 137 mmol/L (ref 135–145)

## 2023-01-02 LAB — TYPE AND SCREEN
Unit division: 0
Unit division: 0
Unit division: 0

## 2023-01-02 LAB — MAGNESIUM: Magnesium: 2 mg/dL (ref 1.7–2.4)

## 2023-01-02 LAB — BPAM RBC
Blood Product Expiration Date: 202407162359
Blood Product Expiration Date: 202407222359
ISSUE DATE / TIME: 202406230958

## 2023-01-02 LAB — BRAIN NATRIURETIC PEPTIDE: B Natriuretic Peptide: 389.4 pg/mL — ABNORMAL HIGH (ref 0.0–100.0)

## 2023-01-02 LAB — PREPARE RBC (CROSSMATCH)

## 2023-01-02 MED ORDER — FUROSEMIDE 10 MG/ML IJ SOLN
10.0000 mg | Freq: Once | INTRAMUSCULAR | Status: AC
  Administered 2023-01-02: 10 mg via INTRAVENOUS
  Filled 2023-01-02: qty 2

## 2023-01-02 MED ORDER — SODIUM CHLORIDE 0.9% IV SOLUTION: Freq: Once | INTRAVENOUS | Status: AC

## 2023-01-02 NOTE — Progress Notes (Signed)
PROGRESS NOTE                                                                                                                                                                                                             Patient Demographics:    Tammy Lloyd, is a 89 y.o. female, DOB - 19-May-1924, [id number]  Outpatient Primary MD for the patient is Ronnald Nian, MD    LOS - 2  Admit date - 12/30/2022    Chief Complaint  Patient presents with   Rectal Bleeding       Brief Narrative (HPI from H&P)     89 y.o. female with medical history significant of stroke, hypertension, nonobstructive CAD, atrial fibrillation with history of SVR on Xarelto, meningioma, macular degeneration, breast cancer presenting with dark stools, workup consistent with upper GI bleed causing blood loss elated anemia she was admitted for further care.   Subjective:   Patient in bed, appears comfortable, denies any headache, no fever, no chest pain or pressure, no shortness of breath , no abdominal pain. No new focal weakness.    Assessment  & Plan :   Acute blood loss anemia likely caused by upper GI bleed.  In a patient who is on Xarelto for A-fib, elevated BUN, melanotic stool suggestive of upper GI bleed, s/p 2 units of packed red blood transfusion in the ER, still stool appears melanotic, continue to monitor CBC and transfuse as needed will try and keep her hemoglobin around 8 due to her age and underlying CAD, GI on board, on IV PPI, underwent EGD on 01/01/2023 which was unremarkable, subsequently underwent tagged RBC scan showing Positive for active GI bleeding originating in the distal small bowel in the inferior right pelvis, had a long discussion with patient's daughter on 01/02/2023.  Will transfuse another unit of packed RBC on 01/02/2023, melena seems to be slowing down per her rectal tube output.  Due to patient's age the plan right now is to  transfuse and hope that the bleeding stops in the next 24 to 48 hours, if it continues then IR consultation for embolization if it is deemed to be a low risk procedure, if IR thinks it will cause significant bowel ischemia then comfort measures.  History of CVA, CAD, paroxysmal A-fib.  Xarelto on hold due to #1 above, continue statin  as tolerated for secondary prevention.  AKI on CKD 3A.  Last creatinine few years ago was 1.3.  AKI due to anemia and prerenal azotemia, transfuse and monitor.  Avoid nephrotoxins.  Renal function mildly improving.  Hypertension.  Low-dose Norvasc for now.  Dyslipidemia.  On statin.  History of macular degeneration, breast cancer and meningioma.  Supportive care, follow-up with PCP.    Advanced age, frail status.  PT OT, supportive care.  DNR.      Condition - Extremely Guarded  Family Communication  :  daughter Jeronimo Norma [phone]  on 12/31/22, 01/02/23  Code Status :  DNR  Consults  :  GI  PUD Prophylaxis : PPI   Procedures  :     Tagged RBC - Positive for active GI bleeding originating in the distal small bowel in the inferior right pelvis  EGD due 01/01/2023.      Disposition Plan  :    Status is: Observation  DVT Prophylaxis  :    SCDs Start: 12/30/22 1458    Lab Results  Component Value Date   PLT 157 01/02/2023    Diet :  Diet Order             Diet clear liquid Room service appropriate? Yes; Fluid consistency: Thin  Diet effective now                    Inpatient Medications  Scheduled Meds:  potassium chloride  40 mEq Oral Once   pravastatin  20 mg Oral QHS   sodium chloride flush  3 mL Intravenous Q12H   Continuous Infusions:   PRN Meds:.acetaminophen **OR** acetaminophen, hydrALAZINE  Antibiotics  :    Anti-infectives (From admission, onward)    None         Objective:   Vitals:   01/01/23 2147 01/01/23 2323 01/02/23 0324 01/02/23 0400  BP: (!) 153/51 131/79 (!) 107/43 (!) 105/48  Pulse: (!)  51 60 (!) 55 (!) 55  Resp: 18 18 17    Temp:  97.6 F (36.4 C) 97.6 F (36.4 C)   TempSrc: Oral Oral Oral   SpO2: 98% 91% 96% 99%  Weight:      Height:        Wt Readings from Last 3 Encounters:  01/01/23 69.1 kg  06/09/22 65.8 kg  12/21/21 65.8 kg     Intake/Output Summary (Last 24 hours) at 01/02/2023 0803 Last data filed at 01/02/2023 0300 Gross per 24 hour  Intake 745 ml  Output 1650 ml  Net -905 ml     Physical Exam  Awake Alert, No new F.N deficits, Normal affect [place].AT,PERRAL Supple Neck, No JVD,   Symmetrical Chest wall movement, Good air movement bilaterally, CTAB RRR,No Gallops,Rubs or new Murmurs,  +ve B.Sounds, Abd Soft, No tenderness,   No Cyanosis, Clubbing or edema     RN pressure injury documentation: Pressure Injury 12/30/22 Sacrum Mid Stage 2 -  Partial thickness loss of dermis presenting as a shallow open injury with a red, pink wound bed without slough. (Active)  12/30/22 2000  Location: Sacrum  Location Orientation: Mid  Staging: Stage 2 -  Partial thickness loss of dermis presenting as a shallow open injury with a red, pink wound bed without slough.  Wound Description (Comments):   Present on Admission: Yes  Dressing Type Foam - Lift dressing to assess site every shift 01/01/23 2007      Data Review:    Recent Labs  Lab 12/30/22 1342  12/31/22 0033 12/31/22 0815 12/31/22 2000 01/01/23 0433 01/01/23 1707 01/02/23 0253  WBC 7.8 8.5 8.6 9.8 9.8 9.0 9.0  HGB 4.3* 7.2* 6.7* 9.7* 8.6* 7.3* 7.7*  HCT 13.8* 22.3* 20.0* 29.3* 25.2* 21.4* 23.1*  PLT 262 229 209 204 182 190 157  MCV 106.2* 89.6 87.0 89.1 90.6 89.2 92.0  MCH 33.1 28.9 29.1 29.5 30.9 30.4 30.7  MCHC 31.2 32.3 33.5 33.1 34.1 34.1 33.3  RDW 13.4 22.9* 23.4* 19.5* 19.3* 19.2* 17.4*  LYMPHSABS 0.7 2.0  --   --  1.4  --  1.5  MONOABS 0.7 1.0  --   --  1.0  --  1.1*  EOSABS 0.1 0.1  --   --  0.2  --  0.3  BASOSABS 0.0 0.1  --   --  0.0  --  0.0    Recent Labs  Lab  12/30/22 1342 12/31/22 0033 01/01/23 0433 01/02/23 0253  NA 135 136 138 137  K 4.5 4.2 3.5 3.8  CL 104 106 108 108  CO2 18* 18* 18* 18*  ANIONGAP 13 12 12 11   GLUCOSE 159* 116* 108* 100*  BUN 140* 135* 126* 109*  CREATININE 2.58* 2.35* 2.08* 1.87*  AST 14* 15  --   --   ALT 8 10  --   --   ALKPHOS 46 47  --   --   BILITOT <0.1* 0.9  --   --   ALBUMIN 3.1* 3.1*  --   --   BNP  --   --  622.0* 389.4*  MG  --   --  2.1 2.0  CALCIUM 7.9* 8.1* 8.1* 7.9*    Radiology Reports NM GI Blood Loss  Result Date: 01/01/2023 CLINICAL DATA:  Acute GI bleeding.  Decreased hemoglobin. EXAM: NUCLEAR MEDICINE GASTROINTESTINAL BLEEDING SCAN TECHNIQUE: Sequential abdominal images were obtained following intravenous administration of Tc-22m labeled red blood cells. RADIOPHARMACEUTICALS:  25 mCi Tc-3m pertechnetate in-vitro labeled red cells. COMPARISON:  None Available. FINDINGS: Abnormal radiopharmaceutical is initially seen in the inferior right pelvis. This is located in the distal small bowel, with subsequent imaging showing radiotracer passing along the course of the colon. IMPRESSION: Positive for active GI bleeding originating in the distal small bowel in the inferior right pelvis. These results will be called to the ordering clinician or representative by the Radiologist Assistant, and communication documented in the PACS or [employer]. Electronically Signed   By: Danae Orleans M.D.   On: 01/01/2023 16:26   DG Chest Port 1 View  Result Date: 12/30/2022 CLINICAL DATA:  Weakness. EXAM: PORTABLE CHEST 1 VIEW COMPARISON:  October 06, 2013. FINDINGS: Stable cardiomegaly. Minimal bibasilar subsegmental atelectasis or scarring is noted. Severe degenerative changes seen involving both glenohumeral joints. IMPRESSION: Minimal bibasilar subsegmental atelectasis or scarring. Electronically Signed   By: Lupita Raider M.D.   On: 12/30/2022 14:24      Signature  -   Susa Raring M.D on 01/02/2023 at 8:03  AM   -  To page go to [email or web address]

## 2023-01-02 NOTE — Progress Notes (Signed)
Progress Note  Primary GI: Unassigned (initial consult - Dr. Leonides Schanz)  LOS: 2 days   Chief Complaint:Melena   Subjective   No family was present at the time of my evaluation.  Patient denies abdominal pain, nausea, vomiting. Tolerating clear liquids without difficulty. Continuing to have melena per RN report.   Objective   Vital signs in last 24 hours: Temp:  [97 F (36.1 C)-98.6 F (37 C)] 97.8 F (36.6 C) (06/25 0909) Pulse Rate:  [45-60] 55 (06/25 0909) Resp:  [12-20] 18 (06/25 0909) BP: (98-195)/(41-149) 117/52 (06/25 0909) SpO2:  [91 %-99 %] 95 % (06/25 0909) Last BM Date : 01/01/23 Last BM recorded by nurses in past 5 days Stool Type: Type 7 (Liquid consistency with no solid pieces) (01/01/2023  8:07 PM)  General:   female in no acute distress  Heart:  Regular rate and rhythm; no murmurs Pulm: Clear anteriorly; no wheezing Abdomen: soft, nondistended, normal bowel sounds in all quadrants. Nontender without guarding. No organomegaly appreciated. Extremities:  No edema Neurologic:  Alert and  oriented x4;  No focal deficits.  Psych:  Cooperative. Normal mood and affect.  Intake/Output from previous day: 06/24 0701 - 06/25 0700 In: 745 [I.V.:300; Blood:445] Out: 1650 [Urine:550; Stool:1100] Intake/Output this shift: No intake/output data recorded.  Studies/Results: NM GI Blood Loss  Result Date: 01/01/2023 CLINICAL DATA:  Acute GI bleeding.  Decreased hemoglobin. EXAM: NUCLEAR MEDICINE GASTROINTESTINAL BLEEDING SCAN TECHNIQUE: Sequential abdominal images were obtained following intravenous administration of Tc-29m labeled red blood cells. RADIOPHARMACEUTICALS:  25 mCi Tc-86m pertechnetate in-vitro labeled red cells. COMPARISON:  None Available. FINDINGS: Abnormal radiopharmaceutical is initially seen in the inferior right pelvis. This is located in the distal small bowel, with subsequent imaging showing radiotracer passing along the course of the colon. IMPRESSION:  Positive for active GI bleeding originating in the distal small bowel in the inferior right pelvis. These results will be called to the ordering clinician or representative by the Radiologist Assistant, and communication documented in the PACS or [employer]. Electronically Signed   By: Danae Orleans M.D.   On: 01/01/2023 16:26    Lab Results: Recent Labs    01/01/23 0433 01/01/23 1707 01/02/23 0253  WBC 9.8 9.0 9.0  HGB 8.6* 7.3* 7.7*  HCT 25.2* 21.4* 23.1*  PLT 182 190 157   BMET Recent Labs    12/31/22 0033 01/01/23 0433 01/02/23 0253  NA 136 138 137  K 4.2 3.5 3.8  CL 106 108 108  CO2 18* 18* 18*  GLUCOSE 116* 108* 100*  BUN 135* 126* 109*  CREATININE 2.35* 2.08* 1.87*  CALCIUM 8.1* 8.1* 7.9*   LFT Recent Labs    12/31/22 0033  PROT 5.3*  ALBUMIN 3.1*  AST 15  ALT 10  ALKPHOS 47  BILITOT 0.9   PT/INR No results for input(s): "LABPROT", "INR" in the last 72 hours.   Scheduled Meds:  furosemide  10 mg Intravenous Once   potassium chloride  40 mEq Oral Once   pravastatin  20 mg Oral QHS   sodium chloride flush  3 mL Intravenous Q12H   Continuous Infusions:    Patient profile:   89 y.o. female with a past medical history of arthritis, hypertension, nonobstructive CAD, atrial fibrillation on Xarelto, CVA 07/2016, pulmonary hypertension, hemochromatosis meningioma and breast cancer presenting with melena. On Xarelto, last dose was 4:30pm on 6/21. Hemoglobin on admission was 4.3 down (Hg 11.2 on 09/01/2020).     Impression:   GI bleed -  Hgb 7.7, currently receiving 1 unit PRBC -BUN 109, creatinine 1.87, GFR 24 -EGD 6/24: Normal esophagus, normal stomach, normal duodenum, no specimens collected -NM GI blood loss: Positive for active GI bleeding originating in the distal small bowel in the inferior right pelvis.  Atrial fibrillation on Xarelto.   -Last dose of Xarelto was taken at 430 on 6/21.   -Echo 04/19/2020 showed LVEF 55 to 60%. -Holding Xarelto    History of CVA 2018   History of nonobstructive CAD   Plan:   - Dr. Rhea Belton  discussed with Daughter that risk with IR embolization with patient's severe kidney impairment is very high. Site of bleeding is not able to be reached endoscopically. Planning to continue with conservative treatment in hopes bleeding stops on its own - hgb currently stable, receiving 1 unit PRBCS - continue to avoid NSAIDS, blood thinners, and aspirin. -Continue daily CBC and transfuse as needed to maintain HGB > 7   Tammy Lloyd  01/02/2023, 10:27 AM

## 2023-01-02 NOTE — Plan of Care (Signed)

## 2023-01-03 DIAGNOSIS — K921 Melena: Secondary | ICD-10-CM | POA: Diagnosis not present

## 2023-01-03 DIAGNOSIS — D62 Acute posthemorrhagic anemia: Secondary | ICD-10-CM | POA: Diagnosis not present

## 2023-01-03 DIAGNOSIS — Z7901 Long term (current) use of anticoagulants: Secondary | ICD-10-CM | POA: Diagnosis not present

## 2023-01-03 DIAGNOSIS — I4891 Unspecified atrial fibrillation: Secondary | ICD-10-CM | POA: Diagnosis not present

## 2023-01-03 DIAGNOSIS — K922 Gastrointestinal hemorrhage, unspecified: Secondary | ICD-10-CM | POA: Diagnosis not present

## 2023-01-03 LAB — BPAM RBC
Blood Product Expiration Date: 202407202359
ISSUE DATE / TIME: 202406221523
ISSUE DATE / TIME: 202406222040
ISSUE DATE / TIME: 202406231225
ISSUE DATE / TIME: 202406241811
ISSUE DATE / TIME: 202406250844
ISSUE DATE / TIME: 202406261719
Unit Type and Rh: 5100
Unit Type and Rh: 5100
Unit Type and Rh: 5100

## 2023-01-03 LAB — CBC WITH DIFFERENTIAL/PLATELET
Abs Immature Granulocytes: 0.03 10*3/uL (ref 0.00–0.07)
Basophils Absolute: 0.1 10*3/uL (ref 0.0–0.1)
Basophils Relative: 1 %
Eosinophils Absolute: 0.4 10*3/uL (ref 0.0–0.5)
Eosinophils Relative: 5 %
HCT: 21.9 % — ABNORMAL LOW (ref 36.0–46.0)
Hemoglobin: 7.3 g/dL — ABNORMAL LOW (ref 12.0–15.0)
Immature Granulocytes: 0 %
Lymphocytes Relative: 20 %
Lymphs Abs: 1.7 10*3/uL (ref 0.7–4.0)
MCH: 30.2 pg (ref 26.0–34.0)
MCHC: 33.3 g/dL (ref 30.0–36.0)
MCV: 90.5 fL (ref 80.0–100.0)
Monocytes Absolute: 1.1 10*3/uL — ABNORMAL HIGH (ref 0.1–1.0)
Monocytes Relative: 13 %
Neutro Abs: 5.2 10*3/uL (ref 1.7–7.7)
Neutrophils Relative %: 61 %
Platelets: 150 10*3/uL (ref 150–400)
RBC: 2.42 MIL/uL — ABNORMAL LOW (ref 3.87–5.11)
RDW: 17.8 % — ABNORMAL HIGH (ref 11.5–15.5)
WBC: 8.5 10*3/uL (ref 4.0–10.5)
nRBC: 0 % (ref 0.0–0.2)

## 2023-01-03 LAB — CBC
HCT: 23.2 % — ABNORMAL LOW (ref 36.0–46.0)
Hemoglobin: 7.6 g/dL — ABNORMAL LOW (ref 12.0–15.0)
MCH: 30 pg (ref 26.0–34.0)
MCHC: 32.8 g/dL (ref 30.0–36.0)
MCV: 91.7 fL (ref 80.0–100.0)
Platelets: 165 10*3/uL (ref 150–400)
RBC: 2.53 MIL/uL — ABNORMAL LOW (ref 3.87–5.11)
RDW: 17.6 % — ABNORMAL HIGH (ref 11.5–15.5)
WBC: 10.5 10*3/uL (ref 4.0–10.5)
nRBC: 0.2 % (ref 0.0–0.2)

## 2023-01-03 LAB — TYPE AND SCREEN
ABO/RH(D): O POS
ABO/RH(D): O POS
Antibody Screen: NEGATIVE
Unit division: 0
Unit division: 0

## 2023-01-03 LAB — BASIC METABOLIC PANEL
Anion gap: 11 (ref 5–15)
BUN: 115 mg/dL — ABNORMAL HIGH (ref 8–23)
CO2: 18 mmol/L — ABNORMAL LOW (ref 22–32)
Calcium: 7.9 mg/dL — ABNORMAL LOW (ref 8.9–10.3)
Chloride: 108 mmol/L (ref 98–111)
Creatinine, Ser: 1.88 mg/dL — ABNORMAL HIGH (ref 0.44–1.00)
GFR, Estimated: 24 mL/min — ABNORMAL LOW (ref >60)
Glucose, Bld: 98 mg/dL (ref 70–99)
Potassium: 4.3 mmol/L (ref 3.5–5.1)
Sodium: 137 mmol/L (ref 135–145)

## 2023-01-03 LAB — MAGNESIUM: Magnesium: 2 mg/dL (ref 1.7–2.4)

## 2023-01-03 LAB — BRAIN NATRIURETIC PEPTIDE: B Natriuretic Peptide: 337.2 pg/mL — ABNORMAL HIGH (ref 0.0–100.0)

## 2023-01-03 MED ORDER — SODIUM CHLORIDE 0.9% IV SOLUTION: Freq: Once | INTRAVENOUS | Status: AC

## 2023-01-03 NOTE — Progress Notes (Signed)
Progress Note  Primary GI: Unassigned (initial consult - Dr. Leonides Schanz)   LOS: 3 days   Chief Complaint:Melena   Subjective   Patient with family at bedside, daughter. Provided some of the history.   dark stool output in 24hrs. Patient denies abdominal pain, nausea, and vomiting. Tolerating clear liquids well, though daughter states they did not bring the patient's broth this morning but has done well overnight with clears.  Hgb was 8.9 s/p 1 unit pRBCs yesterday. Today hgb has dropped to 7.3.   Objective   Vital signs in last 24 hours: Temp:  [97.6 F (36.4 C)-98.6 F (37 C)] 97.6 F (36.4 C) (06/26 1200) Pulse Rate:  [53-61] 61 (06/26 1200) Resp:  [15-18] 16 (06/26 1200) BP: (113-135)/(38-72) 135/38 (06/26 1200) SpO2:  [92 %-99 %] 99 % (06/26 1200) Weight:  [13 kg] 69 kg (06/26 0500) Last BM Date : 01/02/23 Last BM recorded by nurses in past 5 days Stool Type: Type 7 (Liquid consistency with no solid pieces) (01/03/2023 12:00 AM)  General:   female in no acute distress  Heart:  Regular rate and rhythm; no murmurs Pulm: Clear anteriorly; no wheezing Abdomen: soft, nondistended, normal bowel sounds in all quadrants. Nontender without guarding. No organomegaly appreciated. Dark liquid per rectal pouch Extremities:  No edema Neurologic:  Alert and  oriented x4;  No focal deficits.  Psych:  Cooperative. Normal mood and affect.  Intake/Output from previous day: 06/25 0701 - 06/26 0700 In: 498 [P.O.:150; Blood:348] Out: 850 [Urine:400; Stool:450] Intake/Output this shift: No intake/output data recorded.  Studies/Results: NM GI Blood Loss  Result Date: 01/01/2023 CLINICAL DATA:  Acute GI bleeding.  Decreased hemoglobin. EXAM: NUCLEAR MEDICINE GASTROINTESTINAL BLEEDING SCAN TECHNIQUE: Sequential abdominal images were obtained following intravenous administration of Tc-4m labeled red blood cells. RADIOPHARMACEUTICALS:  25 mCi Tc-18m pertechnetate in-vitro labeled red  cells. COMPARISON:  None Available. FINDINGS: Abnormal radiopharmaceutical is initially seen in the inferior right pelvis. This is located in the distal small bowel, with subsequent imaging showing radiotracer passing along the course of the colon. IMPRESSION: Positive for active GI bleeding originating in the distal small bowel in the inferior right pelvis. These results will be called to the ordering clinician or representative by the Radiologist Assistant, and communication documented in the PACS or [employer]. Electronically Signed   By: Danae Orleans M.D.   On: 01/01/2023 16:26    Lab Results: Recent Labs    01/02/23 0253 01/02/23 1744 01/03/23 0309  WBC 9.0 9.7 8.5  HGB 7.7* 8.9* 7.3*  HCT 23.1* 26.3* 21.9*  PLT 157 171 150   BMET Recent Labs    01/01/23 0433 01/02/23 0253 01/03/23 0309  NA 138 137 137  K 3.5 3.8 4.3  CL 108 108 108  CO2 18* 18* 18*  GLUCOSE 108* 100* 98  BUN 126* 109* 115*  CREATININE 2.08* 1.87* 1.88*  CALCIUM 8.1* 7.9* 7.9*   LFT No results for input(s): "PROT", "ALBUMIN", "AST", "ALT", "ALKPHOS", "BILITOT", "BILIDIR", "IBILI" in the last 72 hours. PT/INR No results for input(s): "LABPROT", "INR" in the last 72 hours.   Scheduled Meds:  potassium chloride  40 mEq Oral Once   pravastatin  20 mg Oral QHS   sodium chloride flush  3 mL Intravenous Q12H   Continuous Infusions:    Patient profile:   89 y.o. female with a past medical history of arthritis, hypertension, nonobstructive CAD, atrial fibrillation on Xarelto, CVA 07/2016, pulmonary hypertension, hemochromatosis meningioma and breast cancer  presenting with melena. On Xarelto, last dose was 4:30pm on 6/21. Hemoglobin on admission was 4.3 down (Hg 11.2 on 09/01/2020).    Impression:   GI bleed -Hgb 7.3.Marland KitchenMarland Kitchen was 8.9 s/p 1 unit pRBC yesterday -BUN 115, 1.88, GFR 24 -EGD 6/24: Normal esophagus, normal stomach, normal duodenum, no specimens collected -NM GI blood loss: Positive for active  GI bleeding originating in the distal small bowel in the inferior right pelvis. - Discussion with daughter led to conservative treatment since risk of IR embolization is very high and site of bleeding cannot be reached endoscopically.  Atrial fibrillation on Xarelto.   -Last dose of Xarelto was taken at 430 on 6/21.   -Echo 04/19/2020 showed LVEF 55 to 60%. -Holding Xarelto   History of CVA 2018   History of nonobstructive CAD   Plan:   - continue conservative treatment and observation - Continue daily CBC and transfuse as needed to maintain HGB > 7  - Continue to hold blood thinner, avoid ASA and NSAIDS - Can advance diet as tolerated  Tammy Lloyd M Tammy Lloyd  01/03/2023, 1:23 PM

## 2023-01-03 NOTE — Care Management Important Message (Signed)
Important Message  Patient Details  Name: Tammy Lloyd MRN: [id number] Date of Birth: Nov 15, 1923   Medicare Important Message Given:  Yes     Lanna Labella Stefan Church 01/03/2023, 4:10 PM

## 2023-01-03 NOTE — Progress Notes (Addendum)
Progress Note    Tammy Lloyd  NGE:[id number] DOB: 09/22/1923  DOA: 12/30/2022 PCP: Ronnald Nian, MD      Brief Narrative:    Medical records reviewed and are as summarized below:  Tammy Lloyd is a 89 y.o. female medical history significant of stroke, hypertension, nonobstructive CAD, atrial fibrillation with history of SVR on Xarelto, meningioma, macular degeneration, breast cancer, who presented to the hospital with dark stools.    She was admitted to the hospital for acute upper GI bleeding complicated by acute blood loss anemia.     Assessment/Plan:   Principal Problem:   Acute GI bleeding Active Problems:   Hypertension   History of breast cancer   CAD (coronary artery disease)   Atrial fibrillation (HCC)   History of CVA (cerebrovascular accident)   Macular degeneration   Anemia   Melena   UGI bleed   Pressure injury of skin   ABLA (acute blood loss anemia)   Body mass index is 24.55 kg/m.   Acute upper GI bleeding, melena complicated by acute blood loss anemia:  S/p EGD on 01/01/2023 which was unremarkable.  GI nuclear scan was positive for active GI bleeding originating in the distal small bowel in the inferior right pelvis. Continue IV Protonix. S/p transfusion with 1 unit of PRBCs.  11/27/2022.  Transfuse another unit of PRBCs because of drop in hemoglobin from 8.9-7.3. S/p transfusion with 5 units of PRBCs thus far. Transfuse PRBC as as needed with target hemoglobin to at least 8 because of advanced age and CAD. Because of advanced age and frailty, conservative management has been adopted.  IR consultation for embolization may be considered if GI bleeding worsens.  If IR thinks this is a high risk procedure then comfort measures may be considered.    History of CVA, CAD, paroxysmal A-fib.  Xarelto on hold due to #1 above, continue statin as tolerated for secondary prevention.   AKI on CKD 3A.  Creatinine is trending down.  Continue to monitor.   Last creatinine about 2 years ago was 1.3.   Avoid nephrotoxins   Hypertension.  Amlodipine on hold   Dyslipidemia.  On statin.   History of macular degeneration, breast cancer and meningioma.  Supportive care, follow-up with PCP.     Advanced age, frail status.  PT OT, supportive care.  DNR.    Diet Order             DIET SOFT Room service appropriate? Yes; Fluid consistency: Thin  Diet effective now                            Consultants: Gastroenterologist  Procedures: EGD 01/01/2023    Medications:    potassium chloride  40 mEq Oral Once   pravastatin  20 mg Oral QHS   sodium chloride flush  3 mL Intravenous Q12H   Continuous Infusions:   Anti-infectives (From admission, onward)    None              Family Communication/Anticipated D/C date and plan/Code Status   DVT prophylaxis: SCDs Start: 12/30/22 1458     Code Status: DNR  Family Communication: Plan discussed with Jeannie, daughter, at the bedside Disposition Plan: Plan to discharge home when medically stable   Status is: Inpatient Remains inpatient appropriate because: Anemia and GI bleeding       Subjective:   Interval events noted.  She does not  report any complaints.  No dizziness, shortness of breath, vomiting or abdominal pain.  Rectal tube still has black stools in the collection bag.  Beverely Low, daughter, was at the bedside    Objective:    Vitals:   01/02/23 2327 01/03/23 0400 01/03/23 0500 01/03/23 0841  BP: (!) 113/42 121/64  (!) 114/51  Pulse: (!) 56 (!) 55  (!) 53  Resp: 16 17  15   Temp: 98.2 F (36.8 C) 98.6 F (37 C)  98 F (36.7 C)  TempSrc: Oral Oral  Axillary  SpO2: 92% 93%  96%  Weight:   69 kg   Height:       No data found.   Intake/Output Summary (Last 24 hours) at 01/03/2023 1110 Last data filed at 01/02/2023 2000 Gross per 24 hour  Intake 498 ml  Output 850 ml  Net -352 ml   Filed Weights   12/30/22 1326 01/01/23 0500  01/03/23 0500  Weight: 68 kg 69.1 kg 69 kg    Exam:  GEN: NAD SKIN: Warm and dry EYES: No pallor or icterus ENT: MMM CV: RRR PULM: CTA B ABD: soft, ND, NT, +BS CNS: AAO x 2 (person and place), contracted knees. Moves extremities spontaneously EXT: No edema or tenderness    Pressure Injury 12/30/22 Sacrum Mid Stage 2 -  Partial thickness loss of dermis presenting as a shallow open injury with a red, pink wound bed without slough. (Active)  12/30/22 2000  Location: Sacrum  Location Orientation: Mid  Staging: Stage 2 -  Partial thickness loss of dermis presenting as a shallow open injury with a red, pink wound bed without slough.  Wound Description (Comments):   Present on Admission: Yes  Dressing Type Foam - Lift dressing to assess site every shift 01/01/23 2007     Data Reviewed:   I have personally reviewed following labs and imaging studies:  Labs: Labs show the following:   Basic Metabolic Panel: Recent Labs  Lab 12/30/22 1342 12/31/22 0033 01/01/23 0433 01/02/23 0253 01/03/23 0309  NA 135 136 138 137 137  K 4.5 4.2 3.5 3.8 4.3  CL 104 106 108 108 108  CO2 18* 18* 18* 18* 18*  GLUCOSE 159* 116* 108* 100* 98  BUN 140* 135* 126* 109* 115*  CREATININE 2.58* 2.35* 2.08* 1.87* 1.88*  CALCIUM 7.9* 8.1* 8.1* 7.9* 7.9*  MG  --   --  2.1 2.0 2.0   GFR Estimated Creatinine Clearance: 16.4 mL/min (A) (by C-G formula based on SCr of 1.88 mg/dL (H)). Liver Function Tests: Recent Labs  Lab 12/30/22 1342 12/31/22 0033  AST 14* 15  ALT 8 10  ALKPHOS 46 47  BILITOT <0.1* 0.9  PROT 5.0* 5.3*  ALBUMIN 3.1* 3.1*   No results for input(s): "LIPASE", "AMYLASE" in the last 168 hours. No results for input(s): "AMMONIA" in the last 168 hours. Coagulation profile No results for input(s): "INR", "PROTIME" in the last 168 hours.  CBC: Recent Labs  Lab 12/30/22 1342 12/31/22 0033 12/31/22 0815 01/01/23 0433 01/01/23 1707 01/02/23 0253 01/02/23 1744  01/03/23 0309  WBC 7.8 8.5   < > 9.8 9.0 9.0 9.7 8.5  NEUTROABS 6.4 5.4  --  7.1  --  6.0  --  5.2  HGB 4.3* 7.2*   < > 8.6* 7.3* 7.7* 8.9* 7.3*  HCT 13.8* 22.3*   < > 25.2* 21.4* 23.1* 26.3* 21.9*  MCV 106.2* 89.6   < > 90.6 89.2 92.0 88.9 90.5  PLT 262 229   < >  182 190 157 171 150   < > = values in this interval not displayed.   Cardiac Enzymes: No results for input(s): "CKTOTAL", "CKMB", "CKMBINDEX", "TROPONINI" in the last 168 hours. BNP (last 3 results) No results for input(s): "PROBNP" in the last 8760 hours. CBG: No results for input(s): "GLUCAP" in the last 168 hours. D-Dimer: No results for input(s): "DDIMER" in the last 72 hours. Hgb A1c: No results for input(s): "HGBA1C" in the last 72 hours. Lipid Profile: No results for input(s): "CHOL", "HDL", "LDLCALC", "TRIG", "CHOLHDL", "LDLDIRECT" in the last 72 hours. Thyroid function studies: No results for input(s): "TSH", "T4TOTAL", "T3FREE", "THYROIDAB" in the last 72 hours.  Invalid input(s): "FREET3" Anemia work up: No results for input(s): "VITAMINB12", "FOLATE", "FERRITIN", "TIBC", "IRON", "RETICCTPCT" in the last 72 hours. Sepsis Labs: Recent Labs  Lab 01/01/23 1707 01/02/23 0253 01/02/23 1744 01/03/23 0309  WBC 9.0 9.0 9.7 8.5    Microbiology No results found for this or any previous visit (from the past 240 hour(s)).  Procedures and diagnostic studies:  NM GI Blood Loss  Result Date: 01/01/2023 CLINICAL DATA:  Acute GI bleeding.  Decreased hemoglobin. EXAM: NUCLEAR MEDICINE GASTROINTESTINAL BLEEDING SCAN TECHNIQUE: Sequential abdominal images were obtained following intravenous administration of Tc-41m labeled red blood cells. RADIOPHARMACEUTICALS:  25 mCi Tc-29m pertechnetate in-vitro labeled red cells. COMPARISON:  None Available. FINDINGS: Abnormal radiopharmaceutical is initially seen in the inferior right pelvis. This is located in the distal small bowel, with subsequent imaging showing radiotracer  passing along the course of the colon. IMPRESSION: Positive for active GI bleeding originating in the distal small bowel in the inferior right pelvis. These results will be called to the ordering clinician or representative by the Radiologist Assistant, and communication documented in the PACS or [employer]. Electronically Signed   By: Danae Orleans M.D.   On: 01/01/2023 16:26               LOS: 3 days   Thurl Boen  [facility]   Pager on [email or web address]. If 7PM-7AM, please contact night-coverage at [email or web address]     01/03/2023, 11:10 AM

## 2023-01-04 ENCOUNTER — Inpatient Hospital Stay (HOSPITAL_COMMUNITY): Payer: Medicare Other

## 2023-01-04 DIAGNOSIS — K921 Melena: Secondary | ICD-10-CM | POA: Diagnosis not present

## 2023-01-04 DIAGNOSIS — R195 Other fecal abnormalities: Secondary | ICD-10-CM | POA: Diagnosis present

## 2023-01-04 DIAGNOSIS — D62 Acute posthemorrhagic anemia: Secondary | ICD-10-CM | POA: Diagnosis not present

## 2023-01-04 DIAGNOSIS — K922 Gastrointestinal hemorrhage, unspecified: Secondary | ICD-10-CM | POA: Diagnosis not present

## 2023-01-04 DIAGNOSIS — I4891 Unspecified atrial fibrillation: Secondary | ICD-10-CM | POA: Diagnosis not present

## 2023-01-04 DIAGNOSIS — Z7901 Long term (current) use of anticoagulants: Secondary | ICD-10-CM | POA: Diagnosis not present

## 2023-01-04 LAB — BASIC METABOLIC PANEL
Anion gap: 8 (ref 5–15)
BUN: 123 mg/dL — ABNORMAL HIGH (ref 8–23)
CO2: 17 mmol/L — ABNORMAL LOW (ref 22–32)
Calcium: 7.7 mg/dL — ABNORMAL LOW (ref 8.9–10.3)
Chloride: 111 mmol/L (ref 98–111)
Creatinine, Ser: 1.89 mg/dL — ABNORMAL HIGH (ref 0.44–1.00)
GFR, Estimated: 24 mL/min — ABNORMAL LOW (ref >60)
Glucose, Bld: 106 mg/dL — ABNORMAL HIGH (ref 70–99)
Potassium: 4.2 mmol/L (ref 3.5–5.1)
Sodium: 136 mmol/L (ref 135–145)

## 2023-01-04 LAB — CBC WITH DIFFERENTIAL/PLATELET
Abs Immature Granulocytes: 0.04 10*3/uL (ref 0.00–0.07)
Basophils Absolute: 0.1 10*3/uL (ref 0.0–0.1)
Basophils Relative: 1 %
Eosinophils Absolute: 0.5 10*3/uL (ref 0.0–0.5)
Eosinophils Relative: 5 %
HCT: 21.4 % — ABNORMAL LOW (ref 36.0–46.0)
Hemoglobin: 7.2 g/dL — ABNORMAL LOW (ref 12.0–15.0)
Immature Granulocytes: 0 %
Lymphocytes Relative: 18 %
Lymphs Abs: 1.7 10*3/uL (ref 0.7–4.0)
MCH: 31.4 pg (ref 26.0–34.0)
MCHC: 33.6 g/dL (ref 30.0–36.0)
MCV: 93.4 fL (ref 80.0–100.0)
Monocytes Absolute: 1.1 10*3/uL — ABNORMAL HIGH (ref 0.1–1.0)
Monocytes Relative: 11 %
Neutro Abs: 6.4 10*3/uL (ref 1.7–7.7)
Neutrophils Relative %: 65 %
Platelets: 137 10*3/uL — ABNORMAL LOW (ref 150–400)
RBC: 2.29 MIL/uL — ABNORMAL LOW (ref 3.87–5.11)
RDW: 17.2 % — ABNORMAL HIGH (ref 11.5–15.5)
WBC: 9.9 10*3/uL (ref 4.0–10.5)
nRBC: 0.2 % (ref 0.0–0.2)

## 2023-01-04 LAB — PREPARE RBC (CROSSMATCH)

## 2023-01-04 LAB — TYPE AND SCREEN: Unit division: 0

## 2023-01-04 LAB — BPAM RBC
Blood Product Expiration Date: 202407242359
ISSUE DATE / TIME: 202406271025
Unit Type and Rh: 5100
Unit Type and Rh: 5100

## 2023-01-04 LAB — BRAIN NATRIURETIC PEPTIDE: B Natriuretic Peptide: 345.8 pg/mL — ABNORMAL HIGH (ref 0.0–100.0)

## 2023-01-04 LAB — MAGNESIUM: Magnesium: 2 mg/dL (ref 1.7–2.4)

## 2023-01-04 MED ORDER — SODIUM CHLORIDE 0.9 % IV SOLN: INTRAVENOUS | Status: AC

## 2023-01-04 MED ORDER — IOHEXOL 350 MG/ML SOLN
65.0000 mL | Freq: Once | INTRAVENOUS | Status: AC | PRN
  Administered 2023-01-04: 65 mL via INTRAVENOUS

## 2023-01-04 MED ORDER — SODIUM CHLORIDE 0.9% IV SOLUTION: Freq: Once | INTRAVENOUS | Status: AC

## 2023-01-04 NOTE — Plan of Care (Signed)
  Problem: Education: Goal: Knowledge of General Education information will improve Description: Including pain rating scale, medication(s)/side effects and non-pharmacologic comfort measures 01/04/2023 1048 by Horris Latino, RN Outcome: Progressing 01/04/2023 1048 by Horris Latino, RN Outcome: Progressing   Problem: Health Behavior/Discharge Planning: Goal: Ability to manage health-related needs will improve 01/04/2023 1048 by Horris Latino, RN Outcome: Progressing 01/04/2023 1048 by Horris Latino, RN Outcome: Progressing   Problem: Clinical Measurements: Goal: Ability to maintain clinical measurements within normal limits will improve 01/04/2023 1048 by Horris Latino, RN Outcome: Progressing 01/04/2023 1048 by Horris Latino, RN Outcome: Progressing Goal: Will remain free from infection 01/04/2023 1048 by Horris Latino, RN Outcome: Progressing 01/04/2023 1048 by Horris Latino, RN Outcome: Progressing Goal: Diagnostic test results will improve 01/04/2023 1048 by Horris Latino, RN Outcome: Progressing 01/04/2023 1048 by Horris Latino, RN Outcome: Progressing Goal: Respiratory complications will improve 01/04/2023 1048 by Horris Latino, RN Outcome: Progressing 01/04/2023 1048 by Horris Latino, RN Outcome: Progressing Goal: Cardiovascular complication will be avoided 01/04/2023 1048 by Horris Latino, RN Outcome: Progressing 01/04/2023 1048 by Horris Latino, RN Outcome: Progressing   Problem: Activity: Goal: Risk for activity intolerance will decrease 01/04/2023 1048 by Horris Latino, RN Outcome: Progressing 01/04/2023 1048 by Horris Latino, RN Outcome: Progressing   Problem: Nutrition: Goal: Adequate nutrition will be maintained Outcome: Progressing   Problem: Elimination: Goal: Will not experience complications related to bowel motility Outcome: Progressing Goal: Will not experience complications related to urinary retention Outcome: Progressing   Problem: Pain Managment: Goal: General  experience of comfort will improve Outcome: Progressing   Problem: Safety: Goal: Ability to remain free from injury will improve Outcome: Progressing   Problem: Skin Integrity: Goal: Risk for impaired skin integrity will decrease Outcome: Progressing

## 2023-01-04 NOTE — Progress Notes (Signed)
Occupational Therapy Treatment Patient Details Name: Tammy Lloyd MRN: [id number] DOB: 23-May-1924 Today's Date: 01/04/2023   History of present illness Pt is a 11 y female admitted with dark stool. Pt with GI bleed and Hgb of 4.3. Pt for EGD today.  PMH: CVA, HTN, CAD, afib on Xeralto, nenigioma, macular degeneration and breast CA.   OT comments  Pt progressing well towards OT goals, able to perform aspects of self feeding with Setup Assist today. Focused session on caregiver education w/ daughter present re: body mechanics + use of hospital bed at home, use of bed pads to assist w/ bed mobility, ADL assist bed level (discussed wrap around briefs vs pull ups) and optimal w/c cushions if pressure sores a concern.   Recommendations for follow up therapy are one component of a multi-disciplinary discharge planning process, led by the attending physician.  Recommendations may be updated based on patient status, additional functional criteria and insurance authorization.    Assistance Recommended at Discharge Frequent or constant Supervision/Assistance  Patient can return home with the following  A lot of help with bathing/dressing/bathroom;Assistance with cooking/housework;Direct supervision/assist for medications management;Direct supervision/assist for financial management;Assist for transportation;Help with stairs or ramp for entrance   Equipment Recommendations  None recommended by OT    Recommendations for Other Services      Precautions / Restrictions Precautions Precautions: Fall;Other (comment) Precaution Comments: rectal pouch Restrictions Weight Bearing Restrictions: No       Mobility Bed Mobility Overal bed mobility: Needs Assistance Bed Mobility: Rolling Rolling: Min guard, Min assist         General bed mobility comments: Min A to L side,pt able to reach to assist in pulling self. Min guard to R side    Transfers                         Balance                                            ADL either performed or assessed with clinical judgement   ADL Overall ADL's : Needs assistance/impaired Eating/Feeding: Set up;Bed level Eating/Feeding Details (indicate cue type and reason): able to hold styrofoam cup and bring to mouth using B hands; daughter reports no issues holding utensils - educated on built up foam grips over utensils if grasp decreases.                                   General ADL Comments: Educated daughter on body mechanics, use of hospital bed at home to raise to her height to avoid bending/back issues when reaching to assist pt bed level. eduacted on use of bed pad to help scoot/roll pt fully as well. educated on air or gel cushion in w/c if pressure wounds become an issue or concern    Extremity/Trunk Assessment Upper Extremity Assessment Upper Extremity Assessment: Generalized weakness   Lower Extremity Assessment Lower Extremity Assessment: Defer to PT evaluation        Vision   Vision Assessment?: No apparent visual deficits Additional Comments: uses reading glasses; difficulty reading menu; likely baseline   Perception     Praxis      Cognition Arousal/Alertness: Awake/alert Behavior During Therapy: WFL for tasks assessed/performed Overall Cognitive Status: History of cognitive impairments -  at baseline                                 General Comments: pleasant, follows directions. difficult to fully assess due to [facility]        Exercises      Shoulder Instructions       General Comments      Pertinent Vitals/ Pain       Pain Assessment Pain Assessment: No/denies pain  Home Living                                          Prior Functioning/Environment              Frequency  Min 1X/week        Progress Toward Goals  OT Goals(current goals can now be found in the care plan section)  Progress towards OT goals:  Progressing toward goals  Acute Rehab OT Goals Patient Stated Goal: resolve bleeding OT Goal Formulation: With patient/family Time For Goal Achievement: 01/15/23 Potential to Achieve Goals: Good ADL Goals Pt Will Perform Eating: with modified independence;bed level;sitting Pt Will Perform Grooming: with supervision;bed level;sitting  Plan Discharge plan remains appropriate    Co-evaluation                 AM-PAC OT "6 Clicks" Daily Activity     Outcome Measure   Help from another person eating meals?: A Little Help from another person taking care of personal grooming?: A Little Help from another person toileting, which includes using toliet, bedpan, or urinal?: Total Help from another person bathing (including washing, rinsing, drying)?: Total Help from another person to put on and taking off regular upper body clothing?: Total Help from another person to put on and taking off regular lower body clothing?: Total 6 Click Score: 10    End of Session    OT Visit Diagnosis: Unsteadiness on feet (R26.81);Other abnormalities of gait and mobility (R26.89);Muscle weakness (generalized) (M62.81)   Activity Tolerance Patient tolerated treatment well   Patient Left in bed;with call bell/phone within reach;with bed alarm set;with family/visitor present   Nurse Communication Mobility status        Time: 4098-1191 OT Time Calculation (min): 22 min  Charges: OT General Charges $OT Visit: 1 Visit OT Treatments $Self Care/Home Management : 8-22 mins  Bradd Canary, OTR/L Acute Rehab Services Office: [phone]   Lorre Munroe 01/04/2023, 11:31 AM

## 2023-01-04 NOTE — Progress Notes (Signed)
Progress Note    Tammy Lloyd  AOZ:[id number] DOB: [birth year]  DOA: 12/30/2022 PCP: Ronnald Nian, MD      Brief Narrative:    Medical records reviewed and are as summarized below:  Tammy Lloyd is a 89 y.o. female medical history significant of stroke, hypertension, nonobstructive CAD, atrial fibrillation with history of SVR on Xarelto, meningioma, macular degeneration, breast cancer, who presented to the hospital with dark stools.    She was admitted to the hospital for acute upper GI bleeding complicated by acute blood loss anemia.     Assessment/Plan:   Principal Problem:   Acute GI bleeding Active Problems:   Hypertension   History of breast cancer   CAD (coronary artery disease)   Atrial fibrillation (HCC)   History of CVA (cerebrovascular accident)   Macular degeneration   Anemia   Melena   UGI bleed   Pressure injury of skin   ABLA (acute blood loss anemia)   Body mass index is 24.66 kg/m.   Acute upper GI bleeding, melena complicated by acute blood loss anemia:  S/p EGD on 01/01/2023 which was unremarkable.  GI nuclear scan was positive for active GI bleeding originating in the distal small bowel in the inferior right pelvis. CTA of the abdomen has been ordered for further evaluation of acute GI bleeding.  IR plans to intervene if there is evidence of active GI bleeding. Continue IV Protonix. Transfused 2 units of PRBCs because of drop in hemoglobin and acute GI bleeding S/p transfusion with 1 unit of PRBCs on 01/03/2023. S/p transfusion with 6 units of PRBCs thus far. Transfuse PRBC as as needed with target hemoglobin to at least 8 because of advanced age and CAD.    History of CVA, CAD, paroxysmal A-fib.  Xarelto on hold due to #1 above, continue statin as tolerated for secondary prevention.   AKI on CKD 3A.  Creatinine is 1.89.  Continue to monitor.  Last creatinine about 2 years ago was 1.3.   Avoid nephrotoxins   Hypertension.  Amlodipine  on hold   Dyslipidemia.  On statin.   History of macular degeneration, breast cancer and meningioma.  Supportive care, follow-up with PCP.     Advanced age, frail status.  PT OT, supportive care.  DNR.    Diet Order             Diet NPO time specified Except for: Sips with Meds  Diet effective now                            Consultants: Gastroenterologist  Procedures: EGD 01/01/2023    Medications:    potassium chloride  40 mEq Oral Once   pravastatin  20 mg Oral QHS   sodium chloride flush  3 mL Intravenous Q12H   Continuous Infusions:   Anti-infectives (From admission, onward)    None              Family Communication/Anticipated D/C date and plan/Code Status   DVT prophylaxis: SCDs Start: 12/30/22 1458     Code Status: DNR  Family Communication: None Disposition Plan: Plan to discharge home when medically stable   Status is: Inpatient Remains inpatient appropriate because: Anemia and GI bleeding       Subjective:   Interval events noted.  She is to have been watery black stools.  Boyd Kerbs, RN, was at the bedside  Objective:    Vitals:  01/04/23 1246 01/04/23 1247 01/04/23 1248 01/04/23 1249  BP:      Pulse: (!) 51 (!) 52 (!) 57 (!) 52  Resp:      Temp:      TempSrc:      SpO2: 100% 100% 98% 99%  Weight:      Height:       No data found.   Intake/Output Summary (Last 24 hours) at 01/04/2023 1321 Last data filed at 01/04/2023 1029 Gross per 24 hour  Intake 965.42 ml  Output --  Net 965.42 ml   Filed Weights   01/01/23 0500 01/03/23 0500 01/04/23 0500  Weight: 69.1 kg 69 kg 69.3 kg    Exam:  GEN: NAD SKIN: Warm and dry EYES: Pale but anicteric ENT: MMM CV: RRR PULM: CTA B ABD: soft, ND, NT, +BS CNS: AAO x 2 (person and place), contractures at bilateral knees, she moves all extremities spontaneously  EXT: No edema or tenderness Rectal tube collection bag has significant amount of watery black  stools       Data Reviewed:   I have personally reviewed following labs and imaging studies:  Labs: Labs show the following:   Basic Metabolic Panel: Recent Labs  Lab 12/31/22 0033 01/01/23 0433 01/02/23 0253 01/03/23 0309 01/04/23 0529  NA 136 138 137 137 136  K 4.2 3.5 3.8 4.3 4.2  CL 106 108 108 108 111  CO2 18* 18* 18* 18* 17*  GLUCOSE 116* 108* 100* 98 106*  BUN 135* 126* 109* 115* 123*  CREATININE 2.35* 2.08* 1.87* 1.88* 1.89*  CALCIUM 8.1* 8.1* 7.9* 7.9* 7.7*  MG  --  2.1 2.0 2.0 2.0   GFR Estimated Creatinine Clearance: 16.3 mL/min (A) (by C-G formula based on SCr of 1.89 mg/dL (H)). Liver Function Tests: Recent Labs  Lab 12/30/22 1342 12/31/22 0033  AST 14* 15  ALT 8 10  ALKPHOS 46 47  BILITOT <0.1* 0.9  PROT 5.0* 5.3*  ALBUMIN 3.1* 3.1*   No results for input(s): "LIPASE", "AMYLASE" in the last 168 hours. No results for input(s): "AMMONIA" in the last 168 hours. Coagulation profile No results for input(s): "INR", "PROTIME" in the last 168 hours.  CBC: Recent Labs  Lab 12/31/22 0033 12/31/22 0815 01/01/23 0433 01/01/23 1707 01/02/23 0253 01/02/23 1744 01/03/23 0309 01/03/23 1824 01/04/23 0529  WBC 8.5   < > 9.8   < > 9.0 9.7 8.5 10.5 9.9  NEUTROABS 5.4  --  7.1  --  6.0  --  5.2  --  6.4  HGB 7.2*   < > 8.6*   < > 7.7* 8.9* 7.3* 7.6* 7.2*  HCT 22.3*   < > 25.2*   < > 23.1* 26.3* 21.9* 23.2* 21.4*  MCV 89.6   < > 90.6   < > 92.0 88.9 90.5 91.7 93.4  PLT 229   < > 182   < > 157 171 150 165 137*   < > = values in this interval not displayed.   Cardiac Enzymes: No results for input(s): "CKTOTAL", "CKMB", "CKMBINDEX", "TROPONINI" in the last 168 hours. BNP (last 3 results) No results for input(s): "PROBNP" in the last 8760 hours. CBG: No results for input(s): "GLUCAP" in the last 168 hours. D-Dimer: No results for input(s): "DDIMER" in the last 72 hours. Hgb A1c: No results for input(s): "HGBA1C" in the last 72 hours. Lipid  Profile: No results for input(s): "CHOL", "HDL", "LDLCALC", "TRIG", "CHOLHDL", "LDLDIRECT" in the last 72 hours. Thyroid function  studies: No results for input(s): "TSH", "T4TOTAL", "T3FREE", "THYROIDAB" in the last 72 hours.  Invalid input(s): "FREET3" Anemia work up: No results for input(s): "VITAMINB12", "FOLATE", "FERRITIN", "TIBC", "IRON", "RETICCTPCT" in the last 72 hours. Sepsis Labs: Recent Labs  Lab 01/02/23 1744 01/03/23 0309 01/03/23 1824 01/04/23 0529  WBC 9.7 8.5 10.5 9.9    Microbiology No results found for this or any previous visit (from the past 240 hour(s)).  Procedures and diagnostic studies:  No results found.             LOS: 4 days   Jeziel Hoffmann  [facility]   Pager on [email or web address]. If 7PM-7AM, please contact night-coverage at [email or web address]     01/04/2023, 1:21 PM

## 2023-01-04 NOTE — Progress Notes (Signed)
I was asked for an opinion on the use of contrast for a contrasted CT to evaluate for source of GI bleeding for possible embolization in this 89 year old patient with CKD.  It seems as if the possible benefit of stopping the bleeding outweighs the risk of contrast nephropathy.  I put the risk of possible post procedure dialysis at less than 5 %.  I would favor NS at 50 per hour 4 hours pre procedure and 10 hours after procedure to help mitigate the risk   Cecille Aver

## 2023-01-04 NOTE — Progress Notes (Addendum)
Progress Note  Primary GI: Unassigned (initial consult - Dr. Leonides Schanz)   LOS: 4 days   Chief Complaint:Melena   Subjective   Patient with family at bedside, daughter. Provided some of the history.   Patient continues to have large-volume dark black stool and her rectal pouch, at this point is not draining well so it is truly unknown how much she has had over the last night into today but her hemoglobin continues to drop. Patient denies abdominal pain, nausea vomiting.   Objective   Vital signs in last 24 hours: Temp:  [97.3 F (36.3 C)-98 F (36.7 C)] 97.8 F (36.6 C) (06/27 0753) Pulse Rate:  [44-61] 50 (06/27 0753) Resp:  [15-20] 15 (06/27 0753) BP: (98-147)/(38-96) 104/58 (06/27 0753) SpO2:  [97 %-100 %] 97 % (06/27 0753) Weight:  [69.3 kg] 69.3 kg (06/27 0500) Last BM Date : 01/02/23 Last BM recorded by nurses in past 5 days Stool Type: Type 7 (Liquid consistency with no solid pieces) (01/03/2023 12:00 AM)  General:   female in no acute distress  Heart:  Regular rate and rhythm; no murmurs Pulm: Clear anteriorly; no wheezing Abdomen: soft, nondistended, normal bowel sounds in all quadrants. Nontender without guarding. No organomegaly appreciated. Dark liquid per rectal pouch, at least 1000 cc in lower pouch and potentially another 500+ cc and rectal pouch Extremities:  No edema Neurologic:  Alert and  oriented x4;  No focal deficits.  Psych:  Cooperative. Normal mood and affect.  Intake/Output from previous day: 06/26 0701 - 06/27 0700 In: 335.4 [Blood:335.4] Out: -  Intake/Output this shift: No intake/output data recorded.  Studies/Results: No results found.  Lab Results: Recent Labs    01/03/23 0309 01/03/23 1824 01/04/23 0529  WBC 8.5 10.5 9.9  HGB 7.3* 7.6* 7.2*  HCT 21.9* 23.2* 21.4*  PLT 150 165 137*   BMET Recent Labs    01/02/23 0253 01/03/23 0309 01/04/23 0529  NA 137 137 136  K 3.8 4.3 4.2  CL 108 108 111  CO2 18* 18* 17*  GLUCOSE  100* 98 106*  BUN 109* 115* 123*  CREATININE 1.87* 1.88* 1.89*  CALCIUM 7.9* 7.9* 7.7*   LFT No results for input(s): "PROT", "ALBUMIN", "AST", "ALT", "ALKPHOS", "BILITOT", "BILIDIR", "IBILI" in the last 72 hours. PT/INR No results for input(s): "LABPROT", "INR" in the last 72 hours.   Scheduled Meds:  sodium chloride   Intravenous Once   potassium chloride  40 mEq Oral Once   pravastatin  20 mg Oral QHS   sodium chloride flush  3 mL Intravenous Q12H   Continuous Infusions:    Patient profile:   89 y.o. female with a past medical history of arthritis, hypertension, nonobstructive CAD, atrial fibrillation on Xarelto, CVA 07/2016, pulmonary hypertension, hemochromatosis meningioma and breast cancer presenting with melena. On Xarelto, last dose was 4:30pm on 6/21. Hemoglobin on admission was 4.3 down (Hg 11.2 on 09/01/2020).    Impression:   GI bleed  8.9 s/p 1 unit pRBC 06/25--> 7.3--7.2 -BUN 123 (115), 1.89, GFR 24 -EGD 6/24: Normal esophagus, normal stomach, normal duodenum, no specimens collected -NM GI blood loss: Positive for active GI bleeding originating in the distal small bowel in the inferior right pelvis. -Patient had conservative management but continues to have evidence of GI blood loss with large volume liquid black/dark stools, transfusion dependent anemia, and elevated BUN on proportion to creatinine -Discussed with daughter, and would prefer not to pursue palliative care at this time. -Discussed with IR,  patient's renal function has improved, will plan on CTA to evaluate for bleeding, if this is positive can take directly to interventional radiology. If this is negative this will give [place] a better look at her anatomy, and potential for further options. -Discussed this with the daughter, she is agreeable with this plan, put an order for CTA stat. N.p.o. until after study.  Atrial fibrillation on Xarelto.   -Last dose of Xarelto was taken at 430 on 6/21.   -Echo  04/19/2020 showed LVEF 55 to 60%. -Holding Xarelto   History of CVA 2018   History of nonobstructive CAD   Doree Albee  01/04/2023, 9:20 AM

## 2023-01-05 ENCOUNTER — Encounter (HOSPITAL_COMMUNITY): Payer: Self-pay | Admitting: Internal Medicine

## 2023-01-05 DIAGNOSIS — R195 Other fecal abnormalities: Secondary | ICD-10-CM

## 2023-01-05 DIAGNOSIS — K921 Melena: Secondary | ICD-10-CM | POA: Diagnosis not present

## 2023-01-05 DIAGNOSIS — Z7901 Long term (current) use of anticoagulants: Secondary | ICD-10-CM | POA: Diagnosis not present

## 2023-01-05 DIAGNOSIS — I4891 Unspecified atrial fibrillation: Secondary | ICD-10-CM | POA: Diagnosis not present

## 2023-01-05 DIAGNOSIS — D62 Acute posthemorrhagic anemia: Secondary | ICD-10-CM | POA: Diagnosis not present

## 2023-01-05 LAB — CBC WITH DIFFERENTIAL/PLATELET
Abs Immature Granulocytes: 0.04 10*3/uL (ref 0.00–0.07)
Basophils Absolute: 0 10*3/uL (ref 0.0–0.1)
Basophils Relative: 0 %
Eosinophils Absolute: 0.4 10*3/uL (ref 0.0–0.5)
Eosinophils Relative: 4 %
HCT: 29 % — ABNORMAL LOW (ref 36.0–46.0)
Hemoglobin: 9.8 g/dL — ABNORMAL LOW (ref 12.0–15.0)
Immature Granulocytes: 0 %
Lymphocytes Relative: 16 %
Lymphs Abs: 1.5 10*3/uL (ref 0.7–4.0)
MCH: 29.3 pg (ref 26.0–34.0)
MCHC: 33.8 g/dL (ref 30.0–36.0)
MCV: 86.6 fL (ref 80.0–100.0)
Monocytes Absolute: 1 10*3/uL (ref 0.1–1.0)
Monocytes Relative: 10 %
Neutro Abs: 6.8 10*3/uL (ref 1.7–7.7)
Neutrophils Relative %: 70 %
Platelets: 130 10*3/uL — ABNORMAL LOW (ref 150–400)
RBC: 3.35 MIL/uL — ABNORMAL LOW (ref 3.87–5.11)
RDW: 17.5 % — ABNORMAL HIGH (ref 11.5–15.5)
WBC: 9.7 10*3/uL (ref 4.0–10.5)
nRBC: 0 % (ref 0.0–0.2)

## 2023-01-05 LAB — BASIC METABOLIC PANEL
Anion gap: 7 (ref 5–15)
BUN: 109 mg/dL — ABNORMAL HIGH (ref 8–23)
CO2: 17 mmol/L — ABNORMAL LOW (ref 22–32)
Calcium: 7.8 mg/dL — ABNORMAL LOW (ref 8.9–10.3)
Chloride: 113 mmol/L — ABNORMAL HIGH (ref 98–111)
Creatinine, Ser: 1.78 mg/dL — ABNORMAL HIGH (ref 0.44–1.00)
GFR, Estimated: 26 mL/min — ABNORMAL LOW (ref >60)
Glucose, Bld: 99 mg/dL (ref 70–99)
Potassium: 3.9 mmol/L (ref 3.5–5.1)
Sodium: 137 mmol/L (ref 135–145)

## 2023-01-05 LAB — BPAM RBC
Blood Product Expiration Date: 202407242359
Blood Product Expiration Date: 202407252359
ISSUE DATE / TIME: 202406271425
Unit Type and Rh: 5100

## 2023-01-05 LAB — TYPE AND SCREEN
Unit division: 0
Unit division: 0

## 2023-01-05 NOTE — Progress Notes (Signed)
Progress Note  Primary GI: Unassigned (initial consult - Dr. Leonides Schanz)   LOS: 5 days   Chief Complaint:Melena   Subjective   No family was present at the time of my evaluation. H&H 7.2 currently at 9.8 status post 2 unit PRBC 6/27 Rectal pouch has been removed, patient was being bathed while I was visiting in the room.  Took a chance to do rectal exam, patient has mild skin breakdown on right upper butt cheek, nontender rectal exam.  Denies rectal pain.  Gross melena on exam. Patient denies abdominal pain, nausea vomiting. No fevers or chills.   Objective   Vital signs in last 24 hours: Temp:  [97.3 F (36.3 C)-98.1 F (36.7 C)] 97.6 F (36.4 C) (06/28 0809) Pulse Rate:  [40-62] 53 (06/28 0809) Resp:  [16-18] 18 (06/28 0809) BP: (103-162)/(36-80) 103/41 (06/28 0809) SpO2:  [95 %-100 %] 97 % (06/28 0809) Weight:  [62.1 kg] 62.1 kg (06/28 0508) Last BM Date : 01/04/23 Last BM recorded by nurses in past 5 days Stool Type: Type 7 (Liquid consistency with no solid pieces) (01/04/2023  8:00 PM)  General:   female in no acute distress  Heart:  Regular rate and rhythm; no murmurs Pulm: Clear anteriorly; no wheezing Abdomen: soft, nondistended, normal bowel sounds in all quadrants. Nontender without guarding. No organomegaly appreciated.  Rectal: Normal external rectal exam, decreased rectal tone, no internal hemorrhoids appreciated, no masses, non tender, black, melenic stools  Patient yesterday extremities:  No edema Neurologic:  Alert and  oriented x2;  No focal deficits.  Psych:  Cooperative. Normal mood and affect.  Intake/Output from previous day: 06/27 0701 - 06/28 0700 In: 2205.4 [P.O.:240; I.V.:548.3; Blood:1417.1] Out: 650 [Stool:650] Intake/Output this shift: Total I/O In: 167.8 [I.V.:167.8] Out: -   Studies/Results: CT ANGIO GI BLEED  Result Date: 01/04/2023 CLINICAL DATA:  Lower GI bleed. EXAM: CTA ABDOMEN AND PELVIS WITHOUT AND WITH CONTRAST TECHNIQUE:  Multidetector CT imaging of the abdomen and pelvis was performed using the standard protocol during bolus administration of intravenous contrast. Multiplanar reconstructed images and MIPs were obtained and reviewed to evaluate the vascular anatomy. RADIATION DOSE REDUCTION: This exam was performed according to the departmental dose-optimization program which includes automated exposure control, adjustment of the mA and/or kV according to patient size and/or use of iterative reconstruction technique. CONTRAST:  65mL OMNIPAQUE IOHEXOL 350 MG/ML SOLN COMPARISON:  None Available. FINDINGS: Evaluation of this exam is limited due to respiratory motion artifact. VASCULAR Aorta: Moderate atherosclerotic calcification. No aneurysmal dilatation or dissection. No periaortic fluid collection. Celiac: There is atherosclerotic calcification and narrowing of the origin of the celiac trunk. The celiac artery and its major branches appear patent. No aneurysmal dilatation or dissection. SMA: Atherosclerotic calcification of the origin of the SMA. The SMA is patent. No aneurysm or dissection. Renals: Atherosclerotic calcification of the origins of the renal arteries. The renal arteries are patent. IMA: The IMA is patent. Inflow: Moderate atherosclerotic calcification of the iliac arteries. No aneurysmal dilatation or dissection. The iliac arteries are patent. Proximal Outflow: The visualized proximal outflow is patent. Veins: The IVC is unremarkable. The main portal vein is patent. No portal venous gas. Review of the MIP images confirms the above findings. NON-VASCULAR Lower chest: There is mild cardiomegaly. Coronary vascular calcification. The visualized lung bases are clear. No intra-abdominal free air or free fluid. Hepatobiliary: The liver is unremarkable. There is mild biliary dilatation or periportal edema. No calcified gallstone or pericholecystic fluid. Pancreas: Unremarkable. No pancreatic  ductal dilatation or surrounding  inflammatory changes. Spleen: Normal in size without focal abnormality. Adrenals/Urinary Tract: The adrenal glands are unremarkable. There is no hydronephrosis or nephrolithiasis on either side. There is symmetric enhancement of the kidneys. A 13 mm exophytic lesion from the medial inferior pole of the left kidney suboptimally evaluated, likely a hemorrhagic or proteinaceous cyst. The visualized ureters and urinary bladder appear unremarkable. Stomach/Bowel: There is herniation of a segment of small bowel into the right inguinal canal without evidence of obstruction. There is loose stool in the rectum suggestive of diarrheal state. Mild diffuse thickened appearance of the colon and thickened and hazy appearance of the rectosigmoid suggestive of proctocolitis. Clinical correlation is recommended. No evidence of bowel obstruction. No evidence of active GI bleed. The appendix is normal. Lymphatic: No adenopathy. Reproductive: The uterus is grossly unremarkable. No adnexal masses. Other: Mild diffuse subcutaneous stranding and edema. No fluid collection. Musculoskeletal: Osteopenia with degenerative changes of the spine. Lower lumbar posterior fusion. No acute osseous pathology. IMPRESSION: 1. No evidence of active GI bleed. 2. Findings suggestive of proctocolitis. No bowel obstruction. Normal appendix. 3. Right inguinal hernia containing a segment of small bowel without evidence of obstruction. 4.  Aortic Atherosclerosis (ICD10-I70.0). Electronically Signed   By: Elgie Collard M.D.   On: 01/04/2023 20:34    Lab Results: Recent Labs    01/03/23 1824 01/04/23 0529 01/05/23 0545  WBC 10.5 9.9 9.7  HGB 7.6* 7.2* 9.8*  HCT 23.2* 21.4* 29.0*  PLT 165 137* 130*   BMET Recent Labs    01/03/23 0309 01/04/23 0529 01/05/23 0545  NA 137 136 137  K 4.3 4.2 3.9  CL 108 111 113*  CO2 18* 17* 17*  GLUCOSE 98 106* 99  BUN 115* 123* 109*  CREATININE 1.88* 1.89* 1.78*  CALCIUM 7.9* 7.7* 7.8*   LFT No  results for input(s): "PROT", "ALBUMIN", "AST", "ALT", "ALKPHOS", "BILITOT", "BILIDIR", "IBILI" in the last 72 hours. PT/INR No results for input(s): "LABPROT", "INR" in the last 72 hours.   Scheduled Meds:  potassium chloride  40 mEq Oral Once   pravastatin  20 mg Oral QHS   sodium chloride flush  3 mL Intravenous Q12H   Continuous Infusions:    Patient profile:   89 y.o. female with a past medical history of arthritis, hypertension, nonobstructive CAD, atrial fibrillation on Xarelto, CVA 07/2016, pulmonary hypertension, hemochromatosis meningioma and breast cancer presenting with melena. On Xarelto, last dose was 4:30pm on 6/21. Hemoglobin on admission was 4.3 down (Hg 11.2 on 09/01/2020).    Impression:   GI bleed S/p 9 PRBC total this hospital visit since 06/22 9.8 up from 7.7 status post 2 unit PRBC 6/27 -BUN 109 (123) (115), 1.78 (1.89), GFR 26 ( 24) -EGD 6/24: Normal esophagus, normal stomach, normal duodenum, no specimens collected -NM GI blood loss: Positive for active GI bleeding originating in the distal small bowel in the inferior right pelvis. 06/28 CTA no evidence of GI bleeding, proctocolitis, right inguinal hernia small bowel without obstruction Rectal exam with gross melena -Likely bleeding is still from distal small bowel as seen on red blood cell scan, was not bleeding fast enough for CTA which was negative. -Endoscopic evaluation will still not be advisable at this time, likely would not be able to reach site of bleeding. Will continue conservative management with PRBC, repeat H&H transfuse greater than 7. CT did show proctocolitis, but patient has no rectal pain, no bright red bleeding, possibly from rectal pouch.  Can consider  hydrocortisone suppositories/mesalamine -Patient had conservative management but continues to have evidence of GI blood loss with large volume liquid black/dark stools, transfusion dependent anemia, and elevated BUN on proportion to  creatinine -Discussed with daughter, and would prefer not to pursue palliative care at this time. Will change diet back to soft diet  Atrial fibrillation on Xarelto.   -Last dose of Xarelto was taken at 430 on 6/21.   -Echo 04/19/2020 showed LVEF 55 to 60%. -Suggest continuing to hold Xarelto   History of CVA 2018   History of nonobstructive CAD   Doree Albee  01/05/2023, 11:40 AM

## 2023-01-05 NOTE — Progress Notes (Signed)
[facility] [phone] [facility]) Hospital Liaison  Received request from [facility] Jordan for hospice services at home after discharge.  Spoke to patient's daughter, Beverely Low, to educate on hospice philosophy, services and team approach to care.  Family verbalized understanding of information given.  Per discussion, plan is for patient to discharge to home tomorrow.    Patient has hospital bed, hoyer lift and wheelchair in home through [employer].  No additional DME requested at this time. Address and contact information verified as correct in chart.  Please send signed and completed DNR with patient at discharge.  Please provide prescriptions at discharge as needed to ensure ongoing symptom management needs.  [facility] contact information given to daughter, [name].  [facility] aware.  Please call with any questions or concerns.  Thank you for the opportunity to participate in this patient's care.  Doreatha Martin, RN, BSN [facility] Liaison [phone]

## 2023-01-05 NOTE — Hospital Course (Signed)
89yo female with h/o CVA, HTN, CAD, afib on Xarelto, macular degeneration, and remote breast cancer who presented on 6/22 with ABLA associated with UGI bleeding.  EGD was unremarkable.  Tagged RBC scan + in distal small bowel in R inferior pelvis.  CTA negative but with proctocolitis.  Transfused 6 units this far, goal Hgb >8.  Xarelto is on hold.

## 2023-01-05 NOTE — TOC Progression Note (Signed)
Transition of Care [facility]) - Progression Note    Patient Details  Name: Tammy Lloyd MRN: [id number] Date of Birth: 10-12-1923  [facility] CM/SW Contact  Gordy Clement, RN Phone Number: 01/05/2023, 3:44 PM  Clinical Narrative:     Provider has spoken with Patient's Daughter. They are wanting to go home with Hospice. Shanita from [facility] has been contacted and asked to reach out to the family. Anticipate patient will dc to home tomorrow.  PTAR will need to be arranged  Med Nec Form/ Facesheet  in chart        Barriers to Discharge: Continued Medical Work up  Expected Discharge Plan and Services       Living arrangements for the past 2 months: Single Family Home                                       Social Determinants of Health (SDOH) Interventions SDOH Screenings   Food Insecurity: No Food Insecurity (12/31/2022)  Housing: Patient Declined (12/31/2022)  Transportation Needs: No Transportation Needs (12/31/2022)  Utilities: Not At Risk (12/31/2022)  Depression (PHQ2-9): Low Risk  (06/09/2022)  Financial Resource Strain: Low Risk  (06/09/2022)  Physical Activity: Inactive (06/09/2022)  Stress: No Stress Concern Present (06/09/2022)  Tobacco Use: Low Risk  (01/05/2023)    Readmission Risk Interventions     No data to display

## 2023-01-05 NOTE — Progress Notes (Signed)
Progress Note   Patient: Tammy Lloyd ZOX:[id number] DOB: 04-01-1924 DOA: 12/30/2022     5 DOS: the patient was seen and examined on 01/05/2023   Brief hospital course: 89yo female with h/o CVA, HTN, CAD, afib on Xarelto, macular degeneration, and remote breast cancer who presented on 6/22 with ABLA associated with UGI bleeding.  EGD was unremarkable.  Tagged RBC scan + in distal small bowel in R inferior pelvis.  CTA negative but with proctocolitis.  Transfused 6 units this far, goal Hgb >8.  Xarelto is on hold.    Assessment and Plan:  Acute upper GI bleeding S/p EGD on 01/01/2023 which was unremarkable.   GI nuclear scan was positive for active GI bleeding originating in the distal small bowel in the inferior right pelvis. CTA of the abdomen negative for bleeding, but did show proctitis IR may intervene if she has recurrent GI bleeding - would need a tagged RBC scan preferably early in the AM so that immediate intervention could be performed; nephrology recommended pre- and post-procedure hydration to mitigate the risk if needed Continue IV Protonix Transition to comfort care would also be considered if she rebleeds, based on my discussion with her daughter today (the patient is bedbound with hearing and visual impairment at baseline) Goal is dc to home tomorrow with home hospice assuming general stability  ABLA Transfused 2 units of PRBCs because of drop in hemoglobin and acute GI bleeding S/p transfusion with 1 unit of PRBCs on 01/03/2023. S/p transfusion with 6 units of PRBCs thus far. Transfuse PRBC as as needed with target hemoglobin to at least 8 because of advanced age and CAD   History of CVA, CAD, paroxysmal A-fib  Xarelto on hold due to #1 above, needs to be held indefinitely at this point Continue statin as tolerated for secondary prevention   AKI on CKD 3A   Appears to be stable at this time Attempt to avoid nephrotoxic medications Recheck BMP in AM     Hypertension Amlodipine on hold   Dyslipidemia On statin.   History of macular degeneration, breast cancer and meningioma Supportive care, follow-up with PCP.     Advanced age, frail status   PT OT, supportive care DNR  Pressure ulcer Pressure Injury 12/30/22 Sacrum Mid Stage 2 -  Partial thickness loss of dermis presenting as a shallow open injury with a red, pink wound bed without slough. (Active)  12/30/22 2000  Location: Sacrum  Location Orientation: Mid  Staging: Stage 2 -  Partial thickness loss of dermis presenting as a shallow open injury with a red, pink wound bed without slough.  Wound Description (Comments):   Present on Admission: Yes    Subjective: She reports feeling ok.  No complaints.  Does not see so cannot tell if stools are bloody or dark.  Physical Exam: Vitals:   01/05/23 0508 01/05/23 0809 01/05/23 1151 01/05/23 1542  BP:  (!) 103/41 (!) 138/49 (!) 151/46  Pulse:  (!) 53 (!) 58 (!) 41  Resp:  18 20 20   Temp:  97.6 F (36.4 C) 97.6 F (36.4 C) 97.6 F (36.4 C)  TempSrc:  Axillary Oral Oral  SpO2:  97% 98% 95%  Weight: 62.1 kg     Height:       General:  Appears calm and comfortable and is in NAD Eyes:  chronic visual impairment ENT:  hard of hearing, normal lips & tongue, mmm Neck:  no LAD, masses or thyromegaly Cardiovascular:  RRR, no m/r/g. No  LE edema.  Respiratory:   CTA bilaterally with no wheezes/rales/rhonchi.  Normal respiratory effort. Abdomen:  soft, NT, ND Skin:  no rash or induration seen on limited exam Musculoskeletal:  grossly normal tone BUE/BLE, good ROM, no bony abnormality Psychiatric:  blunted mood and affect, speech fluent and appropriate Neurologic:  CN 2-12 grossly intact other than eye muscle movements (chronic visual impairment), moves all extremities in coordinated fashion   Radiological Exams on Admission: Independently reviewed - see discussion in A/P where applicable  CT ANGIO GI BLEED  Result Date:  01/04/2023 CLINICAL DATA:  Lower GI bleed. EXAM: CTA ABDOMEN AND PELVIS WITHOUT AND WITH CONTRAST TECHNIQUE: Multidetector CT imaging of the abdomen and pelvis was performed using the standard protocol during bolus administration of intravenous contrast. Multiplanar reconstructed images and MIPs were obtained and reviewed to evaluate the vascular anatomy. RADIATION DOSE REDUCTION: This exam was performed according to the departmental dose-optimization program which includes automated exposure control, adjustment of the mA and/or kV according to patient size and/or use of iterative reconstruction technique. CONTRAST:  65mL OMNIPAQUE IOHEXOL 350 MG/ML SOLN COMPARISON:  None Available. FINDINGS: Evaluation of this exam is limited due to respiratory motion artifact. VASCULAR Aorta: Moderate atherosclerotic calcification. No aneurysmal dilatation or dissection. No periaortic fluid collection. Celiac: There is atherosclerotic calcification and narrowing of the origin of the celiac trunk. The celiac artery and its major branches appear patent. No aneurysmal dilatation or dissection. SMA: Atherosclerotic calcification of the origin of the SMA. The SMA is patent. No aneurysm or dissection. Renals: Atherosclerotic calcification of the origins of the renal arteries. The renal arteries are patent. IMA: The IMA is patent. Inflow: Moderate atherosclerotic calcification of the iliac arteries. No aneurysmal dilatation or dissection. The iliac arteries are patent. Proximal Outflow: The visualized proximal outflow is patent. Veins: The IVC is unremarkable. The main portal vein is patent. No portal venous gas. Review of the MIP images confirms the above findings. NON-VASCULAR Lower chest: There is mild cardiomegaly. Coronary vascular calcification. The visualized lung bases are clear. No intra-abdominal free air or free fluid. Hepatobiliary: The liver is unremarkable. There is mild biliary dilatation or periportal edema. No calcified  gallstone or pericholecystic fluid. Pancreas: Unremarkable. No pancreatic ductal dilatation or surrounding inflammatory changes. Spleen: Normal in size without focal abnormality. Adrenals/Urinary Tract: The adrenal glands are unremarkable. There is no hydronephrosis or nephrolithiasis on either side. There is symmetric enhancement of the kidneys. A 13 mm exophytic lesion from the medial inferior pole of the left kidney suboptimally evaluated, likely a hemorrhagic or proteinaceous cyst. The visualized ureters and urinary bladder appear unremarkable. Stomach/Bowel: There is herniation of a segment of small bowel into the right inguinal canal without evidence of obstruction. There is loose stool in the rectum suggestive of diarrheal state. Mild diffuse thickened appearance of the colon and thickened and hazy appearance of the rectosigmoid suggestive of proctocolitis. Clinical correlation is recommended. No evidence of bowel obstruction. No evidence of active GI bleed. The appendix is normal. Lymphatic: No adenopathy. Reproductive: The uterus is grossly unremarkable. No adnexal masses. Other: Mild diffuse subcutaneous stranding and edema. No fluid collection. Musculoskeletal: Osteopenia with degenerative changes of the spine. Lower lumbar posterior fusion. No acute osseous pathology. IMPRESSION: 1. No evidence of active GI bleed. 2. Findings suggestive of proctocolitis. No bowel obstruction. Normal appendix. 3. Right inguinal hernia containing a segment of small bowel without evidence of obstruction. 4.  Aortic Atherosclerosis (ICD10-I70.0). Electronically Signed   By: Elgie Collard M.D.   On:  01/04/2023 20:34     Pertinent labs:    CO2 17 BUN 109/Creatinine 1.78/GFR 26 - stable WBC 9.7 Hgb 9.8 Platelets 130   Family Communication: Daughter was present later in the day and we spoke at bedside  Disposition: Status is: Inpatient Remains inpatient appropriate because: ongoing monitoring  Planned  Discharge Destination: Home    Time spent: 50 minutes  Author: Jonah Blue, MD 01/05/2023 4:05 PM  For on call review [email or web address].

## 2023-01-05 NOTE — Plan of Care (Signed)
  Problem: Health Behavior/Discharge Planning: Goal: Ability to manage health-related needs will improve Outcome: Progressing   

## 2023-01-05 NOTE — TOC Initial Note (Signed)
Transition of Care [facility]) - Initial/Assessment Note    Patient Details  Name: Tammy Lloyd MRN: [id number] Date of Birth: 1924/01/06  Transition of Care [facility]) CM/SW Contact:    Mearl Latin, LCSW Phone Number: 01/05/2023, 3:05 PM  Clinical Narrative:                 Patient admitted from home. TOC continuing to follow for needs.     Barriers to Discharge: Continued Medical Work up   Patient Goals and CMS Choice            Expected Discharge Plan and Services       Living arrangements for the past 2 months: Single Family Home                                      Prior Living Arrangements/Services Living arrangements for the past 2 months: Single Family Home Lives with:: Adult Children Patient language and need for interpreter reviewed:: Yes        Need for Family Participation in Patient Care: Yes (Comment) Care giver support system in place?: Yes (comment)   Criminal Activity/Legal Involvement Pertinent to Current Situation/Hospitalization: No - Comment as needed  Activities of Daily Living Home Assistive Devices/Equipment: None ADL Screening (condition at time of admission) Patient's cognitive ability adequate to safely complete daily activities?: No Is the patient deaf or have difficulty hearing?: Yes Does the patient have difficulty seeing, even when wearing glasses/contacts?: Yes Does the patient have difficulty concentrating, remembering, or making decisions?: Yes Patient able to express need for assistance with ADLs?: No Does the patient have difficulty dressing or bathing?: Yes Independently performs ADLs?: No Communication: Needs assistance Dressing (OT): Needs assistance Grooming: Needs assistance Feeding: Needs assistance Bathing: Dependent Toileting: Dependent In/Out Bed: Dependent Walks in Home: Dependent Does the patient have difficulty walking or climbing stairs?: Yes Weakness of Legs: Both Weakness of Arms/Hands:  Both  Permission Sought/Granted                  Emotional Assessment Appearance:: Appears stated age Attitude/Demeanor/Rapport: Gracious Affect (typically observed): Pleasant Orientation: : Oriented to Self, Oriented to Place Alcohol / Substance Use: Not Applicable Psych Involvement: No (comment)  Admission diagnosis:  Acute GI bleeding [K92.2] Lower GI bleed [K92.2] AKI (acute kidney injury) (HCC) [N17.9] UGI bleed [K92.2] Patient Active Problem List   Diagnosis Date Noted   Occult GI bleeding 01/04/2023   ABLA (acute blood loss anemia) 01/01/2023   UGI bleed 12/31/2022   Pressure injury of skin 12/31/2022   Acute GI bleeding 12/30/2022   Anemia 12/30/2022   Melena 12/30/2022   Palliative care encounter 10/11/2021   Age-related cataract 04/04/2021   Encounter for long-term (current) use of non-steroidal anti-inflammatories 04/02/2020   Chronic pain of both knees 04/02/2020   Knee swelling 04/02/2020   Decreased range of motion of both knees 04/02/2020   Fall 04/02/2020   Edema 04/02/2020   Decreased activities of daily living (ADL) 04/02/2020   Impaired ambulation 04/02/2020   Frequent nosebleeds 04/02/2020   Fatigue 04/02/2020   Left epiretinal membrane 01/15/2020   Intermediate stage nonexudative age-related macular degeneration of right eye 01/15/2020   Advanced nonexudative age-related macular degeneration of left eye without subfoveal involvement 01/15/2020   Macular degeneration 02/25/2019   Presbycusis of both ears 12/28/2016   History of CVA (cerebrovascular accident) 07/20/2016   Abnormality of gait 10/06/2013  Meningioma (HCC) 10/05/2013   Atrial fibrillation (HCC) 10/05/2013   CAD (coronary artery disease) 10/23/2011   Idiopathic hemochromatosis 09/28/2011   Hypertension 12/19/2010   Arthritis 12/19/2010   History of breast cancer 12/19/2010   PCP:  Ronnald Nian, MD Pharmacy:   [facility] - [place], [place] - [address] [place] [address] Phone: [phone] Fax: [phone]  [facility]) - [place], [place] - [address] [facility] [address] [place] [address] [place] [address] Phone: [phone] Fax: [phone]  [facility] - [place], [place] - [address] [place]  [address] Phone: [phone] Fax: [phone]     Social Determinants of Health (SDOH) Social History: SDOH Screenings   Food Insecurity: No Food Insecurity (12/31/2022)  Housing: Patient Declined (12/31/2022)  Transportation Needs: No Transportation Needs (12/31/2022)  Utilities: Not At Risk (12/31/2022)  Depression (PHQ2-9): Low Risk  (06/09/2022)  Financial Resource Strain: Low Risk  (06/09/2022)  Physical Activity: Inactive (06/09/2022)  Stress: No Stress Concern Present (06/09/2022)  Tobacco Use: Low Risk  (01/05/2023)   SDOH Interventions:     Readmission Risk Interventions     No data to display

## 2023-01-06 DIAGNOSIS — R195 Other fecal abnormalities: Secondary | ICD-10-CM | POA: Diagnosis not present

## 2023-01-06 LAB — BASIC METABOLIC PANEL
Anion gap: 7 (ref 5–15)
BUN: 88 mg/dL — ABNORMAL HIGH (ref 8–23)
CO2: 16 mmol/L — ABNORMAL LOW (ref 22–32)
Calcium: 8.1 mg/dL — ABNORMAL LOW (ref 8.9–10.3)
Chloride: 115 mmol/L — ABNORMAL HIGH (ref 98–111)
Creatinine, Ser: 1.53 mg/dL — ABNORMAL HIGH (ref 0.44–1.00)
GFR, Estimated: 31 mL/min — ABNORMAL LOW (ref >60)
Glucose, Bld: 94 mg/dL (ref 70–99)
Potassium: 3.8 mmol/L (ref 3.5–5.1)
Sodium: 138 mmol/L (ref 135–145)

## 2023-01-06 LAB — CBC WITH DIFFERENTIAL/PLATELET
Abs Immature Granulocytes: 0.04 10*3/uL (ref 0.00–0.07)
Basophils Absolute: 0.1 10*3/uL (ref 0.0–0.1)
Basophils Relative: 1 %
Eosinophils Absolute: 0.5 10*3/uL (ref 0.0–0.5)
Eosinophils Relative: 5 %
HCT: 27.4 % — ABNORMAL LOW (ref 36.0–46.0)
Hemoglobin: 9.4 g/dL — ABNORMAL LOW (ref 12.0–15.0)
Immature Granulocytes: 0 %
Lymphocytes Relative: 12 %
Lymphs Abs: 1.2 10*3/uL (ref 0.7–4.0)
MCH: 30.9 pg (ref 26.0–34.0)
MCHC: 34.3 g/dL (ref 30.0–36.0)
MCV: 90.1 fL (ref 80.0–100.0)
Monocytes Absolute: 1.1 10*3/uL — ABNORMAL HIGH (ref 0.1–1.0)
Monocytes Relative: 11 %
Neutro Abs: 6.9 10*3/uL (ref 1.7–7.7)
Neutrophils Relative %: 71 %
Platelets: 143 10*3/uL — ABNORMAL LOW (ref 150–400)
RBC: 3.04 MIL/uL — ABNORMAL LOW (ref 3.87–5.11)
RDW: 18.3 % — ABNORMAL HIGH (ref 11.5–15.5)
WBC: 9.7 10*3/uL (ref 4.0–10.5)
nRBC: 0 % (ref 0.0–0.2)

## 2023-01-06 NOTE — Plan of Care (Signed)

## 2023-01-06 NOTE — TOC Transition Note (Addendum)
Transition of Care [facility]) - CM/SW Discharge Note   Patient Details  Name: Tammy Lloyd MRN: [id number] Date of Birth: 09-18-1923  Transition of Care [facility]) CM/SW Contact:  Ronny Bacon, RN Phone Number: 01/06/2023, 10:43 AM   Clinical Narrative:   Patient being discharged home today. Spoke with daughter [name] and someone will be home when patient arrives. [facility] liaison aware pf patient being discharged home. PTAR arranged for transportation.  Floor nurse confirmed Med Necessity form, facesheet and DNR form on chart and ready when [facility] arrives.    Final next level of care: Home w Hospice Care Barriers to Discharge: No Barriers Identified   Patient Goals and CMS Choice      Discharge Placement                         Discharge Plan and Services Additional resources added to the After Visit Summary for                                       Social Determinants of Health (SDOH) Interventions SDOH Screenings   Food Insecurity: No Food Insecurity (12/31/2022)  Housing: Patient Declined (12/31/2022)  Transportation Needs: No Transportation Needs (12/31/2022)  Utilities: Not At Risk (12/31/2022)  Depression (PHQ2-9): Low Risk  (06/09/2022)  Financial Resource Strain: Low Risk  (06/09/2022)  Physical Activity: Inactive (06/09/2022)  Stress: No Stress Concern Present (06/09/2022)  Tobacco Use: Low Risk  (01/05/2023)     Readmission Risk Interventions     No data to display

## 2023-01-06 NOTE — Discharge Summary (Signed)
Physician Discharge Summary   Patient: Tammy Lloyd MRN: [id number] DOB: Dec 30, 1923  Admit date:     12/30/2022  Discharge date: 01/06/23  Discharge Physician: Jonah Blue   PCP: Ronnald Nian, MD   Recommendations at discharge:   Follow up with home hospice Hold BP medications for now (Norvasc, hydralazine, furosemide, Diovan HCT) and decide with PCP/hospice about whether to resume STOP Xarelto Consider stopping pravastatin - discuss with PCP/hospice  Discharge Diagnoses: Principal Problem:   Occult GI bleeding Active Problems:   Hypertension   History of breast cancer   CAD (coronary artery disease)   Atrial fibrillation (HCC)   History of CVA (cerebrovascular accident)   Macular degeneration   Pressure injury of skin   ABLA (acute blood loss anemia)    Hospital Course: 89yo female with h/o CVA, HTN, CAD, afib on Xarelto, macular degeneration, and remote breast cancer who presented on 6/22 with ABLA associated with UGI bleeding.  EGD was unremarkable.  Tagged RBC scan + in distal small bowel in R inferior pelvis.  CTA negative but with proctocolitis.  Transfused 6 units this far, goal Hgb >8.  Xarelto is on hold.    Assessment and Plan:  Acute upper GI bleeding S/p EGD on 01/01/2023 which was unremarkable.   GI nuclear scan was positive for active GI bleeding originating in the distal small bowel in the inferior right pelvis. CTA of the abdomen negative for bleeding, but did show proctitis IR may intervene if she has recurrent GI bleeding - would need a tagged RBC scan preferably early in the AM so that immediate intervention could be performed; nephrology recommended pre- and post-procedure hydration to mitigate the risk if needed She has not re-bled, Hgb is stable, and patient and daughter are in agreement with dc to home today Transition to comfort care should be considered if she rebleeds (the patient is bedbound with hearing and visual impairment at baseline) Will  discharge to home with home hospice    ABLA Transfused 2 units of PRBCs because of drop in hemoglobin and acute GI bleeding S/p transfusion with 1 unit of PRBCs on 01/03/2023. S/p transfusion with 6 units of PRBCs thus far. Target hemoglobin is at least 8 because of advanced age and CAD   History of CVA, CAD, paroxysmal A-fib  Xarelto on hold due to #1 above, needs to be held indefinitely at this point Continue statin as tolerated for secondary prevention   CKD 3A   AKI on admission, improving  Attempt to avoid nephrotoxic medications   Hypertension Amlodipine, hydralazine, Lasix, Diovan-HCT on hold   Dyslipidemia On statin - continue for now and discuss further with hospice/PCP   History of macular degeneration, breast cancer and meningioma Supportive care, follow-up with PCP   Goals of care Patient with very advanced age, frailty, non-ambulatory, chronic vision/hearing impairment DNR Home with hospice   Pressure ulcer Pressure Injury 12/30/22 Sacrum Mid Stage 2 -  Partial thickness loss of dermis presenting as a shallow open injury with a red, pink wound bed without slough. (Active)  12/30/22 2000  Location: Sacrum  Location Orientation: Mid  Staging: Stage 2 -  Partial thickness loss of dermis presenting as a shallow open injury with a red, pink wound bed without slough.  Wound Description (Comments):   Present on Admission: Yes     Consultants: GI, nephrology (telephone only); OT/PT; TOC team Procedures performed: EGD  Disposition: Home Diet recommendation:  Regular diet DISCHARGE MEDICATION: Allergies as of 01/06/2023  Reactions   Tape Other (See Comments)   Skin is thin and may tear or bruise easily        Medication List     STOP taking these medications    amLODipine 5 MG tablet Commonly known as: NORVASC   furosemide 40 MG tablet Commonly known as: LASIX   hydrALAZINE 10 MG tablet Commonly known as: APRESOLINE   mupirocin ointment 2  % Commonly known as: Bactroban   valsartan-hydrochlorothiazide 160-12.5 MG tablet Commonly known as: DIOVAN-HCT   Xarelto 15 MG Tabs tablet Generic drug: Rivaroxaban       TAKE these medications    acetaminophen 325 MG tablet Commonly known as: TYLENOL Take 650 mg by mouth every 6 (six) hours as needed for headache (or pain). Reported on 10/14/2015   pravastatin 20 MG tablet Commonly known as: PRAVACHOL TAKE 1 TABLET BY MOUTH AT  BEDTIME   PreserVision AREDS 2 Caps Take 1 tablet by mouth 2 (two) times daily.        Discharge Exam: Filed Weights   01/04/23 0500 01/05/23 0508 01/06/23 0612  Weight: 69.3 kg 62.1 kg 61.3 kg   Vitals:   01/06/23 0357 01/06/23 0612  BP: (!) 117/53   Pulse: (!) 47 (!) 55  Resp:    Temp: (!) 97.2 F (36.2 C)   SpO2: 97% 93%    General:  Appears calm and comfortable and is in NAD Eyes:   EOMI, normal lids, iris ENT:  hard of hearing, grossly normal lips & tongue, mmm Neck:  no LAD, masses or thyromegaly Cardiovascular:  RRR, no m/r/g. No LE edema.  Respiratory:   CTA bilaterally with no wheezes/rales/rhonchi.  Normal respiratory effort. Abdomen:  soft, NT, ND Skin:  no rash or induration seen on limited exam Musculoskeletal:  grossly normal tone BUE/BLE, good ROM, no bony abnormality Psychiatric:  pleasant mood and affect, speech sparse but appropriate Neurologic:  CN 2-12 grossly intact, moves all extremities in coordinated fashion   Pertinent labs:    CO2 16 BUN 88/Creatinine 1.53/GFR 31, improved WBC 9.7 Hgb 9.4, 9.8 yesterday   Condition at discharge: improving  The results of significant diagnostics from this hospitalization (including imaging, microbiology, ancillary and laboratory) are listed below for reference.   Imaging Studies: CT ANGIO GI BLEED  Result Date: 01/04/2023 CLINICAL DATA:  Lower GI bleed. EXAM: CTA ABDOMEN AND PELVIS WITHOUT AND WITH CONTRAST TECHNIQUE: Multidetector CT imaging of the abdomen and  pelvis was performed using the standard protocol during bolus administration of intravenous contrast. Multiplanar reconstructed images and MIPs were obtained and reviewed to evaluate the vascular anatomy. RADIATION DOSE REDUCTION: This exam was performed according to the departmental dose-optimization program which includes automated exposure control, adjustment of the mA and/or kV according to patient size and/or use of iterative reconstruction technique. CONTRAST:  65mL OMNIPAQUE IOHEXOL 350 MG/ML SOLN COMPARISON:  None Available. FINDINGS: Evaluation of this exam is limited due to respiratory motion artifact. VASCULAR Aorta: Moderate atherosclerotic calcification. No aneurysmal dilatation or dissection. No periaortic fluid collection. Celiac: There is atherosclerotic calcification and narrowing of the origin of the celiac trunk. The celiac artery and its major branches appear patent. No aneurysmal dilatation or dissection. SMA: Atherosclerotic calcification of the origin of the SMA. The SMA is patent. No aneurysm or dissection. Renals: Atherosclerotic calcification of the origins of the renal arteries. The renal arteries are patent. IMA: The IMA is patent. Inflow: Moderate atherosclerotic calcification of the iliac arteries. No aneurysmal dilatation or dissection. The iliac arteries  are patent. Proximal Outflow: The visualized proximal outflow is patent. Veins: The IVC is unremarkable. The main portal vein is patent. No portal venous gas. Review of the MIP images confirms the above findings. NON-VASCULAR Lower chest: There is mild cardiomegaly. Coronary vascular calcification. The visualized lung bases are clear. No intra-abdominal free air or free fluid. Hepatobiliary: The liver is unremarkable. There is mild biliary dilatation or periportal edema. No calcified gallstone or pericholecystic fluid. Pancreas: Unremarkable. No pancreatic ductal dilatation or surrounding inflammatory changes. Spleen: Normal in size  without focal abnormality. Adrenals/Urinary Tract: The adrenal glands are unremarkable. There is no hydronephrosis or nephrolithiasis on either side. There is symmetric enhancement of the kidneys. A 13 mm exophytic lesion from the medial inferior pole of the left kidney suboptimally evaluated, likely a hemorrhagic or proteinaceous cyst. The visualized ureters and urinary bladder appear unremarkable. Stomach/Bowel: There is herniation of a segment of small bowel into the right inguinal canal without evidence of obstruction. There is loose stool in the rectum suggestive of diarrheal state. Mild diffuse thickened appearance of the colon and thickened and hazy appearance of the rectosigmoid suggestive of proctocolitis. Clinical correlation is recommended. No evidence of bowel obstruction. No evidence of active GI bleed. The appendix is normal. Lymphatic: No adenopathy. Reproductive: The uterus is grossly unremarkable. No adnexal masses. Other: Mild diffuse subcutaneous stranding and edema. No fluid collection. Musculoskeletal: Osteopenia with degenerative changes of the spine. Lower lumbar posterior fusion. No acute osseous pathology. IMPRESSION: 1. No evidence of active GI bleed. 2. Findings suggestive of proctocolitis. No bowel obstruction. Normal appendix. 3. Right inguinal hernia containing a segment of small bowel without evidence of obstruction. 4.  Aortic Atherosclerosis (ICD10-I70.0). Electronically Signed   By: Elgie Collard M.D.   On: 01/04/2023 20:34   NM GI Blood Loss  Result Date: 01/01/2023 CLINICAL DATA:  Acute GI bleeding.  Decreased hemoglobin. EXAM: NUCLEAR MEDICINE GASTROINTESTINAL BLEEDING SCAN TECHNIQUE: Sequential abdominal images were obtained following intravenous administration of Tc-24m labeled red blood cells. RADIOPHARMACEUTICALS:  25 mCi Tc-26m pertechnetate in-vitro labeled red cells. COMPARISON:  None Available. FINDINGS: Abnormal radiopharmaceutical is initially seen in the  inferior right pelvis. This is located in the distal small bowel, with subsequent imaging showing radiotracer passing along the course of the colon. IMPRESSION: Positive for active GI bleeding originating in the distal small bowel in the inferior right pelvis. These results will be called to the ordering clinician or representative by the Radiologist Assistant, and communication documented in the PACS or [employer]. Electronically Signed   By: Danae Orleans M.D.   On: 01/01/2023 16:26   DG Chest Port 1 View  Result Date: 12/30/2022 CLINICAL DATA:  Weakness. EXAM: PORTABLE CHEST 1 VIEW COMPARISON:  October 06, 2013. FINDINGS: Stable cardiomegaly. Minimal bibasilar subsegmental atelectasis or scarring is noted. Severe degenerative changes seen involving both glenohumeral joints. IMPRESSION: Minimal bibasilar subsegmental atelectasis or scarring. Electronically Signed   By: Lupita Raider M.D.   On: 12/30/2022 14:24    Microbiology: Results for orders placed or performed during the hospital encounter of 07/20/16  MRSA PCR Screening     Status: None   Collection Time: 07/21/16  1:32 AM   Specimen: Nasal Mucosa; Nasopharyngeal  Result Value Ref Range Status   MRSA by PCR NEGATIVE NEGATIVE Final    Comment:        The GeneXpert MRSA Assay (FDA approved for NASAL specimens only), is one component of a comprehensive MRSA colonization surveillance program. It is not intended to  diagnose MRSA infection nor to guide or monitor treatment for MRSA infections.      Discharge time spent: greater than 30 minutes.  Signed: Jonah Blue, MD [facility] 01/06/2023

## 2023-01-08 ENCOUNTER — Telehealth: Payer: Self-pay | Admitting: Family Medicine

## 2023-01-08 DIAGNOSIS — D500 Iron deficiency anemia secondary to blood loss (chronic): Secondary | ICD-10-CM | POA: Diagnosis not present

## 2023-01-08 DIAGNOSIS — I4891 Unspecified atrial fibrillation: Secondary | ICD-10-CM | POA: Diagnosis not present

## 2023-01-08 DIAGNOSIS — I2510 Atherosclerotic heart disease of native coronary artery without angina pectoris: Secondary | ICD-10-CM | POA: Diagnosis not present

## 2023-01-08 DIAGNOSIS — I10 Essential (primary) hypertension: Secondary | ICD-10-CM | POA: Diagnosis not present

## 2023-01-08 DIAGNOSIS — K922 Gastrointestinal hemorrhage, unspecified: Secondary | ICD-10-CM | POA: Diagnosis not present

## 2023-01-08 DIAGNOSIS — H3530 Unspecified macular degeneration: Secondary | ICD-10-CM | POA: Diagnosis not present

## 2023-01-08 DIAGNOSIS — I69818 Other symptoms and signs involving cognitive functions following other cerebrovascular disease: Secondary | ICD-10-CM | POA: Diagnosis not present

## 2023-01-08 DIAGNOSIS — L89319 Pressure ulcer of right buttock, unspecified stage: Secondary | ICD-10-CM | POA: Diagnosis not present

## 2023-01-08 DIAGNOSIS — Z8673 Personal history of transient ischemic attack (TIA), and cerebral infarction without residual deficits: Secondary | ICD-10-CM | POA: Diagnosis not present

## 2023-01-08 DIAGNOSIS — D329 Benign neoplasm of meninges, unspecified: Secondary | ICD-10-CM | POA: Diagnosis not present

## 2023-01-08 DIAGNOSIS — M1990 Unspecified osteoarthritis, unspecified site: Secondary | ICD-10-CM | POA: Diagnosis not present

## 2023-01-08 NOTE — Telephone Encounter (Signed)
Jearld Adjutant with [facility] called  [phone]   Ms Reigner is being admitted to Hospice today and family is requesting for you to be her physician  Please advise

## 2023-01-09 DIAGNOSIS — I2510 Atherosclerotic heart disease of native coronary artery without angina pectoris: Secondary | ICD-10-CM | POA: Diagnosis not present

## 2023-01-09 DIAGNOSIS — K922 Gastrointestinal hemorrhage, unspecified: Secondary | ICD-10-CM | POA: Diagnosis not present

## 2023-01-09 DIAGNOSIS — L89319 Pressure ulcer of right buttock, unspecified stage: Secondary | ICD-10-CM | POA: Diagnosis not present

## 2023-01-09 DIAGNOSIS — I69818 Other symptoms and signs involving cognitive functions following other cerebrovascular disease: Secondary | ICD-10-CM | POA: Diagnosis not present

## 2023-01-09 DIAGNOSIS — I10 Essential (primary) hypertension: Secondary | ICD-10-CM | POA: Diagnosis not present

## 2023-01-09 DIAGNOSIS — D500 Iron deficiency anemia secondary to blood loss (chronic): Secondary | ICD-10-CM | POA: Diagnosis not present

## 2023-01-09 NOTE — Telephone Encounter (Signed)
Darcel Bayley with [facility] [phone]  That Dr Susann Givens will be Ms Junk' doctor while in [facility]

## 2023-01-11 DIAGNOSIS — I2510 Atherosclerotic heart disease of native coronary artery without angina pectoris: Secondary | ICD-10-CM | POA: Diagnosis not present

## 2023-01-11 DIAGNOSIS — L89319 Pressure ulcer of right buttock, unspecified stage: Secondary | ICD-10-CM | POA: Diagnosis not present

## 2023-01-11 DIAGNOSIS — D500 Iron deficiency anemia secondary to blood loss (chronic): Secondary | ICD-10-CM | POA: Diagnosis not present

## 2023-01-11 DIAGNOSIS — I10 Essential (primary) hypertension: Secondary | ICD-10-CM | POA: Diagnosis not present

## 2023-01-11 DIAGNOSIS — K922 Gastrointestinal hemorrhage, unspecified: Secondary | ICD-10-CM | POA: Diagnosis not present

## 2023-01-11 DIAGNOSIS — I69818 Other symptoms and signs involving cognitive functions following other cerebrovascular disease: Secondary | ICD-10-CM | POA: Diagnosis not present

## 2023-01-12 DIAGNOSIS — I69818 Other symptoms and signs involving cognitive functions following other cerebrovascular disease: Secondary | ICD-10-CM | POA: Diagnosis not present

## 2023-01-12 DIAGNOSIS — D500 Iron deficiency anemia secondary to blood loss (chronic): Secondary | ICD-10-CM | POA: Diagnosis not present

## 2023-01-12 DIAGNOSIS — I10 Essential (primary) hypertension: Secondary | ICD-10-CM | POA: Diagnosis not present

## 2023-01-12 DIAGNOSIS — L89319 Pressure ulcer of right buttock, unspecified stage: Secondary | ICD-10-CM | POA: Diagnosis not present

## 2023-01-12 DIAGNOSIS — K922 Gastrointestinal hemorrhage, unspecified: Secondary | ICD-10-CM | POA: Diagnosis not present

## 2023-01-12 DIAGNOSIS — I2510 Atherosclerotic heart disease of native coronary artery without angina pectoris: Secondary | ICD-10-CM | POA: Diagnosis not present

## 2023-01-14 DIAGNOSIS — I10 Essential (primary) hypertension: Secondary | ICD-10-CM | POA: Diagnosis not present

## 2023-01-14 DIAGNOSIS — I69818 Other symptoms and signs involving cognitive functions following other cerebrovascular disease: Secondary | ICD-10-CM | POA: Diagnosis not present

## 2023-01-14 DIAGNOSIS — L89319 Pressure ulcer of right buttock, unspecified stage: Secondary | ICD-10-CM | POA: Diagnosis not present

## 2023-01-14 DIAGNOSIS — K922 Gastrointestinal hemorrhage, unspecified: Secondary | ICD-10-CM | POA: Diagnosis not present

## 2023-01-14 DIAGNOSIS — I2510 Atherosclerotic heart disease of native coronary artery without angina pectoris: Secondary | ICD-10-CM | POA: Diagnosis not present

## 2023-01-14 DIAGNOSIS — D500 Iron deficiency anemia secondary to blood loss (chronic): Secondary | ICD-10-CM | POA: Diagnosis not present

## 2023-01-16 DIAGNOSIS — K922 Gastrointestinal hemorrhage, unspecified: Secondary | ICD-10-CM | POA: Diagnosis not present

## 2023-01-16 DIAGNOSIS — L89319 Pressure ulcer of right buttock, unspecified stage: Secondary | ICD-10-CM | POA: Diagnosis not present

## 2023-01-16 DIAGNOSIS — I69818 Other symptoms and signs involving cognitive functions following other cerebrovascular disease: Secondary | ICD-10-CM | POA: Diagnosis not present

## 2023-01-16 DIAGNOSIS — D500 Iron deficiency anemia secondary to blood loss (chronic): Secondary | ICD-10-CM | POA: Diagnosis not present

## 2023-01-16 DIAGNOSIS — I10 Essential (primary) hypertension: Secondary | ICD-10-CM | POA: Diagnosis not present

## 2023-01-16 DIAGNOSIS — I2510 Atherosclerotic heart disease of native coronary artery without angina pectoris: Secondary | ICD-10-CM | POA: Diagnosis not present

## 2023-01-17 DIAGNOSIS — D500 Iron deficiency anemia secondary to blood loss (chronic): Secondary | ICD-10-CM | POA: Diagnosis not present

## 2023-01-17 DIAGNOSIS — I69818 Other symptoms and signs involving cognitive functions following other cerebrovascular disease: Secondary | ICD-10-CM | POA: Diagnosis not present

## 2023-01-17 DIAGNOSIS — I10 Essential (primary) hypertension: Secondary | ICD-10-CM | POA: Diagnosis not present

## 2023-01-17 DIAGNOSIS — I2510 Atherosclerotic heart disease of native coronary artery without angina pectoris: Secondary | ICD-10-CM | POA: Diagnosis not present

## 2023-01-17 DIAGNOSIS — L89319 Pressure ulcer of right buttock, unspecified stage: Secondary | ICD-10-CM | POA: Diagnosis not present

## 2023-01-17 DIAGNOSIS — K922 Gastrointestinal hemorrhage, unspecified: Secondary | ICD-10-CM | POA: Diagnosis not present

## 2023-01-18 DIAGNOSIS — L89319 Pressure ulcer of right buttock, unspecified stage: Secondary | ICD-10-CM | POA: Diagnosis not present

## 2023-01-18 DIAGNOSIS — I69818 Other symptoms and signs involving cognitive functions following other cerebrovascular disease: Secondary | ICD-10-CM | POA: Diagnosis not present

## 2023-01-18 DIAGNOSIS — K922 Gastrointestinal hemorrhage, unspecified: Secondary | ICD-10-CM | POA: Diagnosis not present

## 2023-01-18 DIAGNOSIS — I10 Essential (primary) hypertension: Secondary | ICD-10-CM | POA: Diagnosis not present

## 2023-01-18 DIAGNOSIS — D500 Iron deficiency anemia secondary to blood loss (chronic): Secondary | ICD-10-CM | POA: Diagnosis not present

## 2023-01-18 DIAGNOSIS — I2510 Atherosclerotic heart disease of native coronary artery without angina pectoris: Secondary | ICD-10-CM | POA: Diagnosis not present

## 2023-01-23 DIAGNOSIS — K922 Gastrointestinal hemorrhage, unspecified: Secondary | ICD-10-CM | POA: Diagnosis not present

## 2023-01-23 DIAGNOSIS — I69818 Other symptoms and signs involving cognitive functions following other cerebrovascular disease: Secondary | ICD-10-CM | POA: Diagnosis not present

## 2023-01-23 DIAGNOSIS — L89319 Pressure ulcer of right buttock, unspecified stage: Secondary | ICD-10-CM | POA: Diagnosis not present

## 2023-01-23 DIAGNOSIS — I2510 Atherosclerotic heart disease of native coronary artery without angina pectoris: Secondary | ICD-10-CM | POA: Diagnosis not present

## 2023-01-23 DIAGNOSIS — D500 Iron deficiency anemia secondary to blood loss (chronic): Secondary | ICD-10-CM | POA: Diagnosis not present

## 2023-01-23 DIAGNOSIS — I10 Essential (primary) hypertension: Secondary | ICD-10-CM | POA: Diagnosis not present

## 2023-01-24 DIAGNOSIS — L89319 Pressure ulcer of right buttock, unspecified stage: Secondary | ICD-10-CM | POA: Diagnosis not present

## 2023-01-24 DIAGNOSIS — K922 Gastrointestinal hemorrhage, unspecified: Secondary | ICD-10-CM | POA: Diagnosis not present

## 2023-01-24 DIAGNOSIS — I10 Essential (primary) hypertension: Secondary | ICD-10-CM | POA: Diagnosis not present

## 2023-01-24 DIAGNOSIS — D500 Iron deficiency anemia secondary to blood loss (chronic): Secondary | ICD-10-CM | POA: Diagnosis not present

## 2023-01-24 DIAGNOSIS — I69818 Other symptoms and signs involving cognitive functions following other cerebrovascular disease: Secondary | ICD-10-CM | POA: Diagnosis not present

## 2023-01-24 DIAGNOSIS — I2510 Atherosclerotic heart disease of native coronary artery without angina pectoris: Secondary | ICD-10-CM | POA: Diagnosis not present

## 2023-01-25 DIAGNOSIS — I69818 Other symptoms and signs involving cognitive functions following other cerebrovascular disease: Secondary | ICD-10-CM | POA: Diagnosis not present

## 2023-01-25 DIAGNOSIS — I2510 Atherosclerotic heart disease of native coronary artery without angina pectoris: Secondary | ICD-10-CM | POA: Diagnosis not present

## 2023-01-25 DIAGNOSIS — I10 Essential (primary) hypertension: Secondary | ICD-10-CM | POA: Diagnosis not present

## 2023-01-25 DIAGNOSIS — L89319 Pressure ulcer of right buttock, unspecified stage: Secondary | ICD-10-CM | POA: Diagnosis not present

## 2023-01-25 DIAGNOSIS — K922 Gastrointestinal hemorrhage, unspecified: Secondary | ICD-10-CM | POA: Diagnosis not present

## 2023-01-25 DIAGNOSIS — D500 Iron deficiency anemia secondary to blood loss (chronic): Secondary | ICD-10-CM | POA: Diagnosis not present

## 2023-01-29 DIAGNOSIS — I2510 Atherosclerotic heart disease of native coronary artery without angina pectoris: Secondary | ICD-10-CM | POA: Diagnosis not present

## 2023-01-29 DIAGNOSIS — L89319 Pressure ulcer of right buttock, unspecified stage: Secondary | ICD-10-CM | POA: Diagnosis not present

## 2023-01-29 DIAGNOSIS — K922 Gastrointestinal hemorrhage, unspecified: Secondary | ICD-10-CM | POA: Diagnosis not present

## 2023-01-29 DIAGNOSIS — I10 Essential (primary) hypertension: Secondary | ICD-10-CM | POA: Diagnosis not present

## 2023-01-29 DIAGNOSIS — D500 Iron deficiency anemia secondary to blood loss (chronic): Secondary | ICD-10-CM | POA: Diagnosis not present

## 2023-01-29 DIAGNOSIS — I69818 Other symptoms and signs involving cognitive functions following other cerebrovascular disease: Secondary | ICD-10-CM | POA: Diagnosis not present

## 2023-01-30 DIAGNOSIS — L89319 Pressure ulcer of right buttock, unspecified stage: Secondary | ICD-10-CM | POA: Diagnosis not present

## 2023-01-30 DIAGNOSIS — K922 Gastrointestinal hemorrhage, unspecified: Secondary | ICD-10-CM | POA: Diagnosis not present

## 2023-01-30 DIAGNOSIS — I2510 Atherosclerotic heart disease of native coronary artery without angina pectoris: Secondary | ICD-10-CM | POA: Diagnosis not present

## 2023-01-30 DIAGNOSIS — I10 Essential (primary) hypertension: Secondary | ICD-10-CM | POA: Diagnosis not present

## 2023-01-30 DIAGNOSIS — D500 Iron deficiency anemia secondary to blood loss (chronic): Secondary | ICD-10-CM | POA: Diagnosis not present

## 2023-01-30 DIAGNOSIS — I69818 Other symptoms and signs involving cognitive functions following other cerebrovascular disease: Secondary | ICD-10-CM | POA: Diagnosis not present

## 2023-02-02 DIAGNOSIS — D500 Iron deficiency anemia secondary to blood loss (chronic): Secondary | ICD-10-CM | POA: Diagnosis not present

## 2023-02-02 DIAGNOSIS — I10 Essential (primary) hypertension: Secondary | ICD-10-CM | POA: Diagnosis not present

## 2023-02-02 DIAGNOSIS — L89319 Pressure ulcer of right buttock, unspecified stage: Secondary | ICD-10-CM | POA: Diagnosis not present

## 2023-02-02 DIAGNOSIS — I69818 Other symptoms and signs involving cognitive functions following other cerebrovascular disease: Secondary | ICD-10-CM | POA: Diagnosis not present

## 2023-02-02 DIAGNOSIS — I2510 Atherosclerotic heart disease of native coronary artery without angina pectoris: Secondary | ICD-10-CM | POA: Diagnosis not present

## 2023-02-02 DIAGNOSIS — K922 Gastrointestinal hemorrhage, unspecified: Secondary | ICD-10-CM | POA: Diagnosis not present

## 2023-02-04 DIAGNOSIS — L89319 Pressure ulcer of right buttock, unspecified stage: Secondary | ICD-10-CM | POA: Diagnosis not present

## 2023-02-04 DIAGNOSIS — I10 Essential (primary) hypertension: Secondary | ICD-10-CM | POA: Diagnosis not present

## 2023-02-04 DIAGNOSIS — I69818 Other symptoms and signs involving cognitive functions following other cerebrovascular disease: Secondary | ICD-10-CM | POA: Diagnosis not present

## 2023-02-04 DIAGNOSIS — D500 Iron deficiency anemia secondary to blood loss (chronic): Secondary | ICD-10-CM | POA: Diagnosis not present

## 2023-02-04 DIAGNOSIS — K922 Gastrointestinal hemorrhage, unspecified: Secondary | ICD-10-CM | POA: Diagnosis not present

## 2023-02-04 DIAGNOSIS — I2510 Atherosclerotic heart disease of native coronary artery without angina pectoris: Secondary | ICD-10-CM | POA: Diagnosis not present

## 2023-02-05 DIAGNOSIS — I69818 Other symptoms and signs involving cognitive functions following other cerebrovascular disease: Secondary | ICD-10-CM | POA: Diagnosis not present

## 2023-02-05 DIAGNOSIS — K922 Gastrointestinal hemorrhage, unspecified: Secondary | ICD-10-CM | POA: Diagnosis not present

## 2023-02-05 DIAGNOSIS — I2510 Atherosclerotic heart disease of native coronary artery without angina pectoris: Secondary | ICD-10-CM | POA: Diagnosis not present

## 2023-02-05 DIAGNOSIS — L89319 Pressure ulcer of right buttock, unspecified stage: Secondary | ICD-10-CM | POA: Diagnosis not present

## 2023-02-05 DIAGNOSIS — D500 Iron deficiency anemia secondary to blood loss (chronic): Secondary | ICD-10-CM | POA: Diagnosis not present

## 2023-02-05 DIAGNOSIS — I10 Essential (primary) hypertension: Secondary | ICD-10-CM | POA: Diagnosis not present

## 2023-02-06 DIAGNOSIS — I10 Essential (primary) hypertension: Secondary | ICD-10-CM | POA: Diagnosis not present

## 2023-02-06 DIAGNOSIS — K922 Gastrointestinal hemorrhage, unspecified: Secondary | ICD-10-CM | POA: Diagnosis not present

## 2023-02-06 DIAGNOSIS — D500 Iron deficiency anemia secondary to blood loss (chronic): Secondary | ICD-10-CM | POA: Diagnosis not present

## 2023-02-06 DIAGNOSIS — I69818 Other symptoms and signs involving cognitive functions following other cerebrovascular disease: Secondary | ICD-10-CM | POA: Diagnosis not present

## 2023-02-06 DIAGNOSIS — I2510 Atherosclerotic heart disease of native coronary artery without angina pectoris: Secondary | ICD-10-CM | POA: Diagnosis not present

## 2023-02-06 DIAGNOSIS — L89319 Pressure ulcer of right buttock, unspecified stage: Secondary | ICD-10-CM | POA: Diagnosis not present

## 2023-02-07 DIAGNOSIS — L89319 Pressure ulcer of right buttock, unspecified stage: Secondary | ICD-10-CM | POA: Diagnosis not present

## 2023-02-07 DIAGNOSIS — I69818 Other symptoms and signs involving cognitive functions following other cerebrovascular disease: Secondary | ICD-10-CM | POA: Diagnosis not present

## 2023-02-07 DIAGNOSIS — I10 Essential (primary) hypertension: Secondary | ICD-10-CM | POA: Diagnosis not present

## 2023-02-07 DIAGNOSIS — K922 Gastrointestinal hemorrhage, unspecified: Secondary | ICD-10-CM | POA: Diagnosis not present

## 2023-02-07 DIAGNOSIS — I2510 Atherosclerotic heart disease of native coronary artery without angina pectoris: Secondary | ICD-10-CM | POA: Diagnosis not present

## 2023-02-07 DIAGNOSIS — D500 Iron deficiency anemia secondary to blood loss (chronic): Secondary | ICD-10-CM | POA: Diagnosis not present

## 2023-02-08 DIAGNOSIS — I2510 Atherosclerotic heart disease of native coronary artery without angina pectoris: Secondary | ICD-10-CM | POA: Diagnosis not present

## 2023-02-08 DIAGNOSIS — K922 Gastrointestinal hemorrhage, unspecified: Secondary | ICD-10-CM | POA: Diagnosis not present

## 2023-02-08 DIAGNOSIS — H3530 Unspecified macular degeneration: Secondary | ICD-10-CM | POA: Diagnosis not present

## 2023-02-08 DIAGNOSIS — M1990 Unspecified osteoarthritis, unspecified site: Secondary | ICD-10-CM | POA: Diagnosis not present

## 2023-02-08 DIAGNOSIS — I69818 Other symptoms and signs involving cognitive functions following other cerebrovascular disease: Secondary | ICD-10-CM | POA: Diagnosis not present

## 2023-02-08 DIAGNOSIS — Z8673 Personal history of transient ischemic attack (TIA), and cerebral infarction without residual deficits: Secondary | ICD-10-CM | POA: Diagnosis not present

## 2023-02-08 DIAGNOSIS — I4891 Unspecified atrial fibrillation: Secondary | ICD-10-CM | POA: Diagnosis not present

## 2023-02-08 DIAGNOSIS — I10 Essential (primary) hypertension: Secondary | ICD-10-CM | POA: Diagnosis not present

## 2023-02-08 DIAGNOSIS — L89319 Pressure ulcer of right buttock, unspecified stage: Secondary | ICD-10-CM | POA: Diagnosis not present

## 2023-02-08 DIAGNOSIS — D500 Iron deficiency anemia secondary to blood loss (chronic): Secondary | ICD-10-CM | POA: Diagnosis not present

## 2023-02-08 DIAGNOSIS — D329 Benign neoplasm of meninges, unspecified: Secondary | ICD-10-CM | POA: Diagnosis not present

## 2023-03-11 DEATH — deceased

## 2023-09-04 ENCOUNTER — Encounter: Payer: Self-pay | Admitting: Internal Medicine

## 2024-04-01 ENCOUNTER — Telehealth: Payer: Self-pay | Admitting: Family Medicine

## 2024-04-01 NOTE — Telephone Encounter (Signed)
Sympathy card sent
